# Patient Record
Sex: Male | Born: 1935 | Race: Black or African American | Hispanic: No | Marital: Married | Smoking: Former smoker
Health system: Southern US, Community
[De-identification: ages and names within clinical notes are randomized; demographics above are authoritative.]

## PROBLEM LIST (undated history)

## (undated) DIAGNOSIS — J449 Chronic obstructive pulmonary disease, unspecified: Secondary | ICD-10-CM

## (undated) DIAGNOSIS — C801 Malignant (primary) neoplasm, unspecified: Secondary | ICD-10-CM

## (undated) DIAGNOSIS — Z9861 Coronary angioplasty status: Secondary | ICD-10-CM

## (undated) DIAGNOSIS — Z955 Presence of coronary angioplasty implant and graft: Secondary | ICD-10-CM

## (undated) DIAGNOSIS — I509 Heart failure, unspecified: Secondary | ICD-10-CM

## (undated) DIAGNOSIS — E785 Hyperlipidemia, unspecified: Secondary | ICD-10-CM

## (undated) DIAGNOSIS — I10 Essential (primary) hypertension: Secondary | ICD-10-CM

## (undated) HISTORY — DX: Essential (primary) hypertension: I10

## (undated) HISTORY — DX: Hyperlipidemia, unspecified: E78.5

## (undated) HISTORY — PX: RADIOACTIVE SEED IMPLANT: SHX5150

## (undated) HISTORY — PX: COLON RESECTION: SHX5231

---

## 2004-01-12 ENCOUNTER — Ambulatory Visit: Admission: RE | Admit: 2004-01-12 | Discharge: 2004-03-14 | Payer: Self-pay | Admitting: Radiation Oncology

## 2004-03-21 ENCOUNTER — Ambulatory Visit: Admission: RE | Admit: 2004-03-21 | Discharge: 2004-06-08 | Payer: Self-pay | Admitting: Radiation Oncology

## 2004-04-28 ENCOUNTER — Ambulatory Visit (HOSPITAL_BASED_OUTPATIENT_CLINIC_OR_DEPARTMENT_OTHER): Admission: RE | Admit: 2004-04-28 | Discharge: 2004-04-28 | Payer: Self-pay | Admitting: Urology

## 2004-04-28 ENCOUNTER — Ambulatory Visit (HOSPITAL_COMMUNITY): Admission: RE | Admit: 2004-04-28 | Discharge: 2004-04-28 | Payer: Self-pay | Admitting: Urology

## 2008-05-08 ENCOUNTER — Ambulatory Visit (HOSPITAL_COMMUNITY): Admission: RE | Admit: 2008-05-08 | Discharge: 2008-05-08 | Payer: Self-pay | Admitting: Urology

## 2009-01-19 ENCOUNTER — Encounter: Admission: RE | Admit: 2009-01-19 | Discharge: 2009-01-19 | Payer: Self-pay | Admitting: Family Medicine

## 2011-02-24 NOTE — Op Note (Signed)
NAMEJAI, STEIL                           ACCOUNT NO.:  [id number]   MEDICAL RECORD NO.:  [id number]                   PATIENT TYPE:  AMB   LOCATION:  [facility]                                 FACILITY:  [facility]   PHYSICIAN:  Lindaann Slough, M.D.               DATE OF BIRTH:  [birth year]   DATE OF PROCEDURE:  04/28/2004  DATE OF DISCHARGE:                                 OPERATIVE REPORT   PREOPERATIVE DIAGNOSES:  Adenocarcinoma of prostate.   POSTOPERATIVE DIAGNOSES:  Adenocarcinoma of prostate.   PROCEDURE:  Seed implantation.   SURGEON:  Lindaann Slough, M.D. and Maryln Gottron, M.D.   ANESTHESIA:  General.   INDICATIONS FOR PROCEDURE:  The patient is a 77 year old male who had  positive prostate biopsy.  His PSA is 6.7 and Gleason score is 6.  Treatment  options were discussed with the patient and he chose to have seed  implantation. He is scheduled today for the procedure.   Under general anesthesia, the patient was prepped and draped and placed in  the dorsal lithotomy position.  A transrectal ultrasound was done and when  the images were identical to the preprocedure ultrasound, the transducer was  fixed and grid was attached to the transducer.  Then on the ultrasound and  fluoroscopic guidance, the seeds were implanted in the prostate. A total of  66 seeds were implanted.  Then the Foley catheter was removed.  A flexible  cystoscope was passed in the bladder. A strand of seed was protruding in the  bladder through the anterior lobe of the prostate on the right side.  The  strand was grasped with grasping forceps and removed. Two seeds were  attached to that strand.  The patient had a total of 64 seeds and Dr. Dayton Scrape  felt that it was not necessary to reimplant those seeds because of good  coverage. There is no stone or tumor in the bladder. The ureteral orifices  are in normal  position and shape with clear efflux.  The cystoscope was then removed. A  #16 Foley catheter  was then inserted in the bladder.   The patient tolerated the procedure well and left the OR in satisfactory  condition to post anesthesia care unit.                                               Lindaann Slough, M.D.    MN/MEDQ  D:  04/28/2004  T:  04/28/2004  Job:  [id number]   cc:   Maryln Gottron, M.D.  [address] [facility]  [place]  [place] [address]  Fax: [phone]   Renaye Rakers, M.D.  [phone] [address], [address]  [place]  [place] [address]  Fax: [phone]

## 2012-04-22 ENCOUNTER — Ambulatory Visit (INDEPENDENT_AMBULATORY_CARE_PROVIDER_SITE_OTHER): Payer: Medicare Other | Admitting: Family Medicine

## 2012-04-22 VITALS — BP 122/67 | HR 95 | Temp 98.3°F | Resp 16 | Ht 70.5 in | Wt 165.0 lb

## 2012-04-22 DIAGNOSIS — N289 Disorder of kidney and ureter, unspecified: Secondary | ICD-10-CM | POA: Insufficient documentation

## 2012-04-22 DIAGNOSIS — E785 Hyperlipidemia, unspecified: Secondary | ICD-10-CM | POA: Insufficient documentation

## 2012-04-22 DIAGNOSIS — R7309 Other abnormal glucose: Secondary | ICD-10-CM

## 2012-04-22 DIAGNOSIS — C61 Malignant neoplasm of prostate: Secondary | ICD-10-CM | POA: Insufficient documentation

## 2012-04-22 DIAGNOSIS — R739 Hyperglycemia, unspecified: Secondary | ICD-10-CM

## 2012-04-22 DIAGNOSIS — I10 Essential (primary) hypertension: Secondary | ICD-10-CM | POA: Insufficient documentation

## 2012-04-22 LAB — LIPID PANEL
HDL: 52 mg/dL (ref >39)
Triglycerides: 94 mg/dL (ref <150)
VLDL: 19 mg/dL (ref 0–40)

## 2012-04-22 LAB — COMPREHENSIVE METABOLIC PANEL
BUN: 27 mg/dL — ABNORMAL HIGH (ref 6–23)
Calcium: 10.5 mg/dL (ref 8.4–10.5)
Chloride: 104 mEq/L (ref 96–112)
Sodium: 140 mEq/L (ref 135–145)
Total Bilirubin: 0.4 mg/dL (ref 0.3–1.2)

## 2012-04-22 LAB — POCT CBC
Hemoglobin: 12.8 g/dL — AB (ref 14.1–18.1)
Lymph, poc: 1.8 (ref 0.6–3.4)
MCH, POC: 26.6 pg — AB (ref 27–31.2)
MCHC: 30.6 g/dL — AB (ref 31.8–35.4)
MID (cbc): 0.6 (ref 0–0.9)
POC LYMPH PERCENT: 28.5 %L (ref 10–50)
RBC: 4.81 M/uL (ref 4.69–6.13)
RDW, POC: 16.6 %
WBC: 6.4 10*3/uL (ref 4.6–10.2)
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
## 2012-04-22 NOTE — Patient Instructions (Addendum)
Return in one year or as needed  Avoid excessive sweets and sugars in your diet

## 2012-04-24 ENCOUNTER — Encounter: Payer: Self-pay | Admitting: Family Medicine

## 2012-12-04 ENCOUNTER — Emergency Department (HOSPITAL_COMMUNITY): Payer: Medicare Other

## 2012-12-04 ENCOUNTER — Emergency Department (HOSPITAL_COMMUNITY)
Admission: EM | Admit: 2012-12-04 | Discharge: 2012-12-04 | Disposition: A | Discharge status: Home / Self Care | Payer: Medicare Other | Attending: Emergency Medicine | Admitting: Emergency Medicine

## 2012-12-04 ENCOUNTER — Encounter (HOSPITAL_COMMUNITY): Payer: Self-pay

## 2012-12-04 DIAGNOSIS — Y9289 Other specified places as the place of occurrence of the external cause: Secondary | ICD-10-CM | POA: Insufficient documentation

## 2012-12-04 DIAGNOSIS — R55 Syncope and collapse: Secondary | ICD-10-CM | POA: Insufficient documentation

## 2012-12-04 DIAGNOSIS — N289 Disorder of kidney and ureter, unspecified: Secondary | ICD-10-CM | POA: Insufficient documentation

## 2012-12-04 DIAGNOSIS — Y9389 Activity, other specified: Secondary | ICD-10-CM | POA: Insufficient documentation

## 2012-12-04 DIAGNOSIS — Z7982 Long term (current) use of aspirin: Secondary | ICD-10-CM | POA: Insufficient documentation

## 2012-12-04 DIAGNOSIS — Z79899 Other long term (current) drug therapy: Secondary | ICD-10-CM | POA: Insufficient documentation

## 2012-12-04 DIAGNOSIS — Z87891 Personal history of nicotine dependence: Secondary | ICD-10-CM | POA: Insufficient documentation

## 2012-12-04 DIAGNOSIS — R296 Repeated falls: Secondary | ICD-10-CM | POA: Insufficient documentation

## 2012-12-04 LAB — CBC WITH DIFFERENTIAL/PLATELET
Basophils Absolute: 0 10*3/uL (ref 0.0–0.1)
Basophils Relative: 0 % (ref 0–1)
Eosinophils Absolute: 0.5 10*3/uL (ref 0.0–0.7)
Eosinophils Relative: 6 % — ABNORMAL HIGH (ref 0–5)
HCT: 33 % — ABNORMAL LOW (ref 39.0–52.0)
Lymphs Abs: 1.7 10*3/uL (ref 0.7–4.0)
MCH: 27.8 pg (ref 26.0–34.0)
MCV: 80.5 fL (ref 78.0–100.0)
Monocytes Relative: 7 % (ref 3–12)
RDW: 15 % (ref 11.5–15.5)
WBC: 7.2 10*3/uL (ref 4.0–10.5)

## 2012-12-04 LAB — BASIC METABOLIC PANEL
CO2: 25 mEq/L (ref 19–32)
Calcium: 10 mg/dL (ref 8.4–10.5)
Chloride: 105 mEq/L (ref 96–112)
Glucose, Bld: 86 mg/dL (ref 70–99)

## 2012-12-04 MED ORDER — SODIUM CHLORIDE 0.9 % IV BOLUS (SEPSIS)
1000.0000 mL | Freq: Once | INTRAVENOUS | Status: AC
  Administered 2012-12-04: 1000 mL via INTRAVENOUS

## 2012-12-04 NOTE — ED Notes (Signed)
Pt. oob to the bathroom, gait steady. Pt. Denies any dizziness

## 2012-12-04 NOTE — ED Provider Notes (Signed)
History     CSN: [id number]  Arrival date & time 12/04/12  1108   First MD Initiated Contact with Patient 12/04/12 1124      Chief Complaint  Patient presents with  . Fall    (Consider location/radiation/quality/duration/timing/severity/associated sxs/prior treatment) Patient is a 79 y.o. male presenting with fall. The history is provided by the patient.  Fall Pertinent negatives include no numbness, no abdominal pain, no nausea, no vomiting and no headaches.   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 States she just began to feel lightheaded. No chest pain or palpitations. He states he felt a little lightheaded and then got better. No nausea vomiting. He has not had episodes like this before. He states he is feeling better now. No diarrhea. Patient has a doctor that he does see. No known cardiac disease.  History reviewed. No pertinent past medical history.  History reviewed. No pertinent past surgical history.  No family history on file.  History  Substance Use Topics  . Smoking status: Former Smoker    Quit date: 04/23/2007  . Smokeless tobacco: Not on file  . Alcohol Use: No      Review of Systems  Constitutional: Negative for activity change and appetite change.  HENT: Negative for neck stiffness.   Eyes: Negative for pain.  Respiratory: Negative for chest tightness and shortness of breath.   Cardiovascular: Negative for chest pain and leg swelling.  Gastrointestinal: Negative for nausea, vomiting, abdominal pain and diarrhea.  Genitourinary: Negative for flank pain.  Musculoskeletal: Negative for back pain.  Skin: Negative for rash.  Neurological: Positive for light-headedness. Negative for weakness, numbness and headaches.  Psychiatric/Behavioral: Negative for behavioral problems.    Allergies  Review of patient's allergies indicates no known allergies.  Home Medications   Current Outpatient Rx  Name  Route  Sig   Dispense  Refill  . aspirin 325 MG EC tablet   Oral   Take 325 mg by mouth daily.         [name] atorvastatin (LIPITOR) 40 MG tablet   Oral   Take 20 mg by mouth daily.         [name] diltiazem (TIAZAC) 360 MG 24 hr capsule   Oral   Take 360 mg by mouth daily.         . fosinopril (MONOPRIL) 40 MG tablet   Oral   Take 40 mg by mouth 2 (two) times daily.         . hydrochlorothiazide (HYDRODIURIL) 25 MG tablet   Oral   Take 25 mg by mouth daily.         [name] terazosin (HYTRIN) 5 MG capsule   Oral   Take 5 mg by mouth at bedtime.           BP 148/66  Pulse 61  Temp(Src) 98 F (36.7 C) (Oral)  Resp 17  SpO2 99%  Physical Exam  Nursing note and vitals reviewed. Constitutional: He is oriented to person, place, and time. He appears well-developed and well-nourished.  HENT:  Head: Normocephalic and atraumatic.  Eyes: EOM are normal. Pupils are equal, round, and reactive to light.  Neck: Normal range of motion. Neck supple.  Cardiovascular: Normal rate, regular rhythm and normal heart sounds.   No murmur heard. Pulmonary/Chest: Effort normal and breath sounds normal.  Abdominal: Soft. Bowel sounds are normal. He exhibits no distension and no mass. There is no tenderness. There is no rebound and no  guarding.  Musculoskeletal: Normal range of motion. He exhibits no edema.  Neurological: He is alert and oriented to person, place, and time. No cranial nerve deficit.  Skin: Skin is warm and dry.  Psychiatric: He has a normal mood and affect.    ED Course  Procedures (including critical care time)  Labs Reviewed  CBC WITH DIFFERENTIAL - Abnormal; Notable for the following:    RBC 4.10 (*)    Hemoglobin 11.4 (*)    HCT 33.0 (*)    Eosinophils Relative 6 (*)    All other components within normal limits  BASIC METABOLIC PANEL - Abnormal; Notable for the following:    BUN 35 (*)    Creatinine, Ser 1.70 (*)    GFR calc non Af Amer 35 (*)    GFR calc Af Amer 41 (*)    All  other components within normal limits  TROPONIN I  GLUCOSE, CAPILLARY  POCT I-STAT TROPONIN I   Dg Chest 2 View  12/04/2012  *RADIOLOGY REPORT*  Clinical Data: Dizziness.  CHEST - 2 VIEW  Comparison: 01/19/2009  Findings: Chronic changes throughout the lungs with interstitial prominence, particularly in the lung bases, likely fibrosis.  Mild hyperinflation.  Heart is normal size.  No effusions.  No acute bony abnormality.  IMPRESSION: Hyperinflation/COPD.  Probable chronic interstitial lung disease and fibrosis.  No definite acute process.   Original Report Authenticated By: Charlett Nose, M.D.      1. Near syncope   2. Renal insufficiency      Date: 12/04/2012  Rate: 65  Rhythm: normal sinus rhythm  QRS Axis: normal  Intervals: normal  ST/T Wave abnormalities: normal  Conduction Disutrbances:left bundle branch block  Narrative Interpretation: LBBB is new since 7/13  Old EKG Reviewed: changes noted    MDM  Patient with a near syncopal episode. BUN creatinine are mildly elevated. Patient feels normal now. He was initially orthostatic but now ambulates.difficulty. EKG shows left bundle branch block. [name] not having chest pain. [name] had no arrhythmias on the monitor. He'll be discharged home to followup with his primary care Dr. His been given 1 L of normal saline.        Juliet Rude. Rubin Payor, MD 12/04/12 [phone]

## 2012-12-04 NOTE — ED Notes (Addendum)
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  Witnessed by bystanders. No LOC.  Pt. Remembers the event.  Pt. Denies any chest pain or sob, or dizziness at present time.  No neuro deficits noted. Pt. Is alert and oriented X3.  Pt. Denies any back or neck pain

## 2012-12-09 ENCOUNTER — Ambulatory Visit (INDEPENDENT_AMBULATORY_CARE_PROVIDER_SITE_OTHER): Payer: Medicare Other | Admitting: Family Medicine

## 2012-12-09 ENCOUNTER — Encounter: Payer: Self-pay | Admitting: Family Medicine

## 2012-12-09 VITALS — BP 137/87 | HR 87 | Temp 97.7°F | Resp 18 | Ht 70.5 in | Wt 164.0 lb

## 2012-12-09 DIAGNOSIS — I10 Essential (primary) hypertension: Secondary | ICD-10-CM

## 2012-12-09 DIAGNOSIS — R55 Syncope and collapse: Secondary | ICD-10-CM

## 2012-12-09 DIAGNOSIS — N289 Disorder of kidney and ureter, unspecified: Secondary | ICD-10-CM

## 2012-12-09 DIAGNOSIS — D649 Anemia, unspecified: Secondary | ICD-10-CM

## 2012-12-09 LAB — POCT CBC
HCT, POC: 37.7 % — AB (ref 43.5–53.7)
Hemoglobin: 11.9 g/dL — AB (ref 14.1–18.1)
MCH, POC: 27.1 pg (ref 27–31.2)
POC Granulocyte: 4.6 (ref 2–6.9)
POC MID %: 8.9 %M (ref 0–12)
RDW, POC: 15.7 %
WBC: 7 10*3/uL (ref 4.6–10.2)

## 2012-12-09 LAB — COMPREHENSIVE METABOLIC PANEL
ALT: <8 U/L (ref 0–53)
AST: 11 U/L (ref 0–37)
Alkaline Phosphatase: 96 U/L (ref 39–117)
CO2: 26 mEq/L (ref 19–32)
Calcium: 10.3 mg/dL (ref 8.4–10.5)
Chloride: 105 mEq/L (ref 96–112)
Potassium: 4.5 mEq/L (ref 3.5–5.3)
Total Bilirubin: 0.4 mg/dL (ref 0.3–1.2)
Total Protein: 7.2 g/dL (ref 6.0–8.3)

## 2012-12-09 LAB — FERRITIN: Ferritin: 23 ng/mL (ref 22–322)

## 2012-12-09 NOTE — Patient Instructions (Signed)
Continue current medications, not taking the extra pill that was increased recently.   Return late August or sooner if needed.

## 2012-12-09 NOTE — Progress Notes (Signed)
Subjective: Patient saw a new doctor at the [facility] recently, and they may change his medication. Believe they increased his hydrochlorothiazide. 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 But was not unconscious. He went to the emergency room and was evaluated. They cut back on his hydrochlorothiazide and gave him fluids. He has not filed for the last week. He did have a little renal insufficiency when he was there. HEENT is no complaints. He has no dizziness or other neurologic symptoms. No chest pains or palpitations.  Objective: Pleasant alert gentleman in no major distress. He is quite active for medicine. His throat is clear. Neck supple without nodes thyromegaly. No carotid bruits. Chest is clear to auscultation. Heart regular without murmurs gallops or arrhythmias. Abdomen soft without masses or tenderness. Extremities normal. Gait normal. Results for orders placed in visit on 12/09/12  POCT CBC      Result Value Range   WBC 7.0  4.6 - 10.2 K/uL   Lymph, poc 1.8  0.6 - 3.4   POC LYMPH PERCENT 25.9  10 - 50 %L   MID (cbc) 0.6  0 - 0.9   POC MID % 8.9  0 - 12 %M   POC Granulocyte 4.6  2 - 6.9   Granulocyte percent 65.2  37 - 80 %G   RBC 4.39 (*) 4.69 - 6.13 M/uL   Hemoglobin 11.9 (*) 14.1 - 18.1 g/dL   HCT, POC 29.5 (*) 62.1 - 53.7 %   MCV 85.9  80 - 97 fL   MCH, POC 27.1  27 - 31.2 pg   MCHC 31.6 (*) 31.8 - 35.4 g/dL   RDW, POC 30.8     Platelet Count, POC 340  142 - 424 K/uL   MPV 9.6  0 - 99.8 fL   Assessment: Recent near-syncope Anemia, chronic. This is probably an anemia of chronic disease as he has had a normal ferritin in the past and has no history of blood loss Hypertension, controlled Renal insufficiency  Plan: Recheck labs No new treatments given him today. Hemoglobin is better than it was when he was in the emergency room.

## 2013-04-28 ENCOUNTER — Encounter: Payer: Self-pay | Admitting: Family Medicine

## 2013-04-28 ENCOUNTER — Ambulatory Visit (INDEPENDENT_AMBULATORY_CARE_PROVIDER_SITE_OTHER): Payer: Medicare Other | Admitting: Family Medicine

## 2013-04-28 ENCOUNTER — Other Ambulatory Visit: Payer: Self-pay | Admitting: Family Medicine

## 2013-04-28 VITALS — BP 120/68 | HR 69 | Temp 97.9°F | Resp 18 | Ht 69.75 in | Wt 164.8 lb

## 2013-04-28 DIAGNOSIS — I70209 Unspecified atherosclerosis of native arteries of extremities, unspecified extremity: Secondary | ICD-10-CM

## 2013-04-28 DIAGNOSIS — I7090 Unspecified atherosclerosis: Secondary | ICD-10-CM

## 2013-04-28 DIAGNOSIS — Z8546 Personal history of malignant neoplasm of prostate: Secondary | ICD-10-CM

## 2013-04-28 DIAGNOSIS — Z0000 Encounter for general adult medical examination without abnormal findings: Secondary | ICD-10-CM

## 2013-04-28 DIAGNOSIS — E785 Hyperlipidemia, unspecified: Secondary | ICD-10-CM

## 2013-04-28 DIAGNOSIS — I10 Essential (primary) hypertension: Secondary | ICD-10-CM

## 2013-04-28 LAB — LIPID PANEL
LDL Cholesterol: 175 mg/dL — ABNORMAL HIGH (ref 0–99)
Total CHOL/HDL Ratio: 4.2 Ratio

## 2013-04-28 LAB — COMPREHENSIVE METABOLIC PANEL
Albumin: 3.9 g/dL (ref 3.5–5.2)
BUN: 28 mg/dL — ABNORMAL HIGH (ref 6–23)
Calcium: 10.3 mg/dL (ref 8.4–10.5)
Glucose, Bld: 62 mg/dL — ABNORMAL LOW (ref 70–99)
Total Bilirubin: 0.4 mg/dL (ref 0.3–1.2)

## 2013-04-28 LAB — HEMOGLOBIN A1C: Hgb A1c MFr Bld: 6 % — ABNORMAL HIGH (ref <5.7)

## 2013-04-28 NOTE — Progress Notes (Signed)
Complete physical exam Patient is here for his annual physical examination. We had computer dysfunction so portions of the record may not be totally completed. He has no complaints. He had some constipation problems but stool softeners and is doing okay. He gets his medicines from the [place] and does not need any refills. He sees them every 4 months or so. He wanted a physical exam today.  Past family social history unremarkable. He still works at the [employer] at least one and weak. He gets a lot of exercise there.  Review of systems: Constitutional unremarkable HEENT unremarkable Cardiovascular unremarkable Respiratory: Unremarkable GI: Unremarkable GU: Unremarkable Genitourinary: Unremarkable. Not sexually functional in the lumbar Muscular skeletal some left hip pains Dermatologic: Unremarkable Neurologic: Unremarkable Psychiatric: Unremarkable Endocrinologic: Unremarkable   Physical examination: Well-developed man in no major distress with normal weight. HEENT normal. He has a high exam yearly. Ears normal except for some cerumen on the left. Throat clear. Neck supple without nodes thyromegaly. No carotid bruits. Chest clear process. Heart regular without murmurs gallops or arrhythmias. And soft the mass or tenderness. Digital exam was not done since he gets that at the [place]. Normal male external genitalia testes descended and no hernias. Nodes were noted. Skin unremarkable. He has a old cyst or S4 head which is unchanged. Musculoskeletal shows fair range of motion. Back is unremarkable marked will. Neuro he is alert and oriented motor and sensory are normal. Decreased pulses in both feet. He quit smoking some years ago.  Assessment: Complete physical examination Osteoarthritis of hip History of prostate cancer Hypertension Hyperlipidemia Probable peripheral vascular disease.  Same treatments. Return when necessary or in one year

## 2013-04-28 NOTE — Patient Instructions (Addendum)
Computer was down

## 2013-05-06 ENCOUNTER — Telehealth: Payer: Self-pay | Admitting: Family Medicine

## 2013-05-06 DIAGNOSIS — E785 Hyperlipidemia, unspecified: Secondary | ICD-10-CM

## 2013-05-06 MED ORDER — ROSUVASTATIN CALCIUM 40 MG PO TABS: 40.0000 mg | ORAL_TABLET | Freq: Every day | ORAL | Status: DC

## 2013-05-06 NOTE — Telephone Encounter (Signed)
Patient came in office this morning to discuss lab results and medications. Reviewed labs and clarified med list. Needs to change Lipitor to Crestor 40 mg 1 poqd sent to [facility].

## 2013-12-06 ENCOUNTER — Ambulatory Visit (INDEPENDENT_AMBULATORY_CARE_PROVIDER_SITE_OTHER): Payer: Medicare Other | Admitting: Family Medicine

## 2013-12-06 ENCOUNTER — Ambulatory Visit: Payer: Medicare Other

## 2013-12-06 VITALS — BP 140/70 | HR 77 | Temp 97.5°F | Resp 16 | Ht 70.5 in | Wt 161.0 lb

## 2013-12-06 DIAGNOSIS — N289 Disorder of kidney and ureter, unspecified: Secondary | ICD-10-CM

## 2013-12-06 DIAGNOSIS — J810 Acute pulmonary edema: Secondary | ICD-10-CM

## 2013-12-06 DIAGNOSIS — R0989 Other specified symptoms and signs involving the circulatory and respiratory systems: Secondary | ICD-10-CM

## 2013-12-06 DIAGNOSIS — R0609 Other forms of dyspnea: Secondary | ICD-10-CM

## 2013-12-06 DIAGNOSIS — C61 Malignant neoplasm of prostate: Secondary | ICD-10-CM

## 2013-12-06 DIAGNOSIS — E785 Hyperlipidemia, unspecified: Secondary | ICD-10-CM

## 2013-12-06 DIAGNOSIS — I10 Essential (primary) hypertension: Secondary | ICD-10-CM

## 2013-12-06 DIAGNOSIS — R0600 Dyspnea, unspecified: Secondary | ICD-10-CM

## 2013-12-06 LAB — COMPLETE METABOLIC PANEL WITH GFR
ALBUMIN: 3.4 g/dL — AB (ref 3.5–5.2)
ALT: <8 U/L (ref 0–53)
AST: 13 U/L (ref 0–37)
Alkaline Phosphatase: 88 U/L (ref 39–117)
BUN: 16 mg/dL (ref 6–23)
CO2: 25 mEq/L (ref 19–32)
Calcium: 9.6 mg/dL (ref 8.4–10.5)
Chloride: 108 mEq/L (ref 96–112)
Creat: 1.2 mg/dL (ref 0.50–1.35)
GFR, Est African American: 63 mL/min
GFR, Est Non African American: 55 mL/min — ABNORMAL LOW
Glucose, Bld: 107 mg/dL — ABNORMAL HIGH (ref 70–99)
POTASSIUM: 3.9 meq/L (ref 3.5–5.3)
Sodium: 142 mEq/L (ref 135–145)
Total Bilirubin: 0.5 mg/dL (ref 0.2–1.2)
Total Protein: 7.1 g/dL (ref 6.0–8.3)

## 2013-12-06 LAB — POCT CBC
GRANULOCYTE PERCENT: 67.1 % (ref 37–80)
HCT, POC: 41 % — AB (ref 43.5–53.7)
Hemoglobin: 12.9 g/dL — AB (ref 14.1–18.1)
Lymph, poc: 1.6 (ref 0.6–3.4)
MCH: 27.6 pg (ref 27–31.2)
MCHC: 31.5 g/dL — AB (ref 31.8–35.4)
MCV: 87.5 fL (ref 80–97)
MID (CBC): 0.5 (ref 0–0.9)
MPV: 9 fL (ref 0–99.8)
POC Granulocyte: 4.4 (ref 2–6.9)
POC LYMPH PERCENT: 25 %L (ref 10–50)
POC MID %: 7.9 % (ref 0–12)
Platelet Count, POC: 299 10*3/uL (ref 142–424)
RBC: 4.68 M/uL — AB (ref 4.69–6.13)
RDW, POC: 15.1 %
WBC: 6.6 10*3/uL (ref 4.6–10.2)

## 2013-12-06 LAB — LIPID PANEL
Cholesterol: 292 mg/dL — ABNORMAL HIGH (ref 0–200)
HDL: 53 mg/dL (ref >39)
LDL Cholesterol: 218 mg/dL — ABNORMAL HIGH (ref 0–99)
TRIGLYCERIDES: 103 mg/dL (ref <150)
Total CHOL/HDL Ratio: 5.5 Ratio
VLDL: 21 mg/dL (ref 0–40)

## 2013-12-06 LAB — IFOBT (OCCULT BLOOD): IFOBT: NEGATIVE

## 2013-12-06 NOTE — Progress Notes (Signed)
Annual exam  History: Patient is here for a regular check. He goes to the [place] a couple of times a year, and they manage many of his things. He has been doing fairly well. [facility] He needs to keep in contact with a Dr. hearing Lady Gary however in case he has acute problems here.  Past medical history: Surgeries: He has had a operation on his bowels where a small piece of intestine was then removed many years ago. No known pathology though he wonders whether there was a little cancer and they are not. No further problems. He did have a prostate cancer and radiation seed removal of the prostate gland. He has not had any further problems with that. Medical illnesses: History of prior pneumonia and COPD History hypertension History of hyperlipidemia History renal insufficiency Medication allergies: None Regular medications: See list new  Social history: Married, remains active and independent. His main limitations are his shortness of breath on exertion. He is a retired Geophysicist/field seismologist.  Review of systems: Constitutional: Dyspnea on exertion HEENT: Unremarkable. He sees Dr. about once a year Respiratory: Shortness of breath as noted above Cardiovascular: Unremarkable Gastrointestinal: Unremarkable Genitourinary: Nocturia one or more times pineal but he has had a trach musculo skeletal: No major arthritic complaints Neurologic: Unremarkable  Physical examination: Well-developed well-nourished man, lean, no major acute distress. Wears glasses. TMs normal. A little a little bit of wax in both ears. Throat was clear. Teeth fair considering his age. Eyes PERRLA. Neck supple without nodes or thyromegaly. No carotid bruits. Chest clear to auscultation. Heart regular without murmurs gallops, but occasional ectopy. Abdomen was soft without masses tenderness. Normal male external genitalia has a right hydrocele, small tomedium size. Digital exam reveals essentially no  prostate tissue. Stools  Negative. Extremities unremarkable. Skin normal. Has 1-2+ pitting edema.  Assessment: Dyspnea on exertion Hypertension Hyperlipidemia COPD Right hydrocele       Results for orders placed in visit on 12/06/13  POCT CBC      Result Value Ref Range   WBC 6.6  4.6 - 10.2 K/uL   Lymph, poc 1.6  0.6 - 3.4   POC LYMPH PERCENT 25.0  10 - 50 %L   MID (cbc) 0.5  0 - 0.9   POC MID % 7.9  0 - 12 %M   POC Granulocyte 4.4  2 - 6.9   Granulocyte percent 67.1  37 - 80 %G   RBC 4.68 (*) 4.69 - 6.13 M/uL   Hemoglobin 12.9 (*) 14.1 - 18.1 g/dL   HCT, POC 41.0 (*) 43.5 - 53.7 %   MCV 87.5  80 - 97 fL   MCH, POC 27.6  27 - 31.2 pg   MCHC 31.5 (*) 31.8 - 35.4 g/dL   RDW, POC 15.1     Platelet Count, POC 299  142 - 424 K/uL   MPV 9.0  0 - 99.8 fL  IFOBT (OCCULT BLOOD)      Result Value Ref Range   IFOBT Negative     Plan: EKG was no acute changes. Has had a left bundle branch block and occasional PVCs. [facility] reading (PRIMARY) by  Dr. Linna Darner Chest x-ray reveals a lot of scarring on the right side. No acute changes. Looks similar to the past.  Continue same medications. Take a copy of his reports to the [place].  If increased dyspnea on exertion would need an echocardiogram

## 2013-12-06 NOTE — Patient Instructions (Signed)
Continue same medications  If you develop more shortness of breath or more swelling should come in promptly for recheck.

## 2013-12-09 ENCOUNTER — Encounter: Payer: Self-pay | Admitting: Family Medicine

## 2013-12-22 ENCOUNTER — Telehealth: Payer: Self-pay

## 2013-12-22 NOTE — Telephone Encounter (Signed)
Dr. Hopper, please advise. 

## 2013-12-22 NOTE — Telephone Encounter (Signed)
Pt came in to walk in clinic and stated he saw Dr Linna Darner recently and then went to the [facility] at his request. He states that they agreed if the medication/treatment for his shortness of breath did not work that he should RTC to tell Dr Linna Darner he needs the rx they talked about. I clarified if he was supposed to see Dr Linna Darner in an office visit or just to leave a message that he needs the rx. He states he was just supposed to leave word that he needs the rx.   [employer] [employer]  bf

## 2013-12-26 NOTE — Telephone Encounter (Signed)
Cannot tell from my nodes what medications he might need regarding his breathing. I thought the [facility] would take care of it since they manage these things. I think you will have to come back in the check and talk further unless he knows exactly a certain medicine that I would have to her just refill.

## 2013-12-27 NOTE — Telephone Encounter (Signed)
Pt will be in Monday 8-2 to discuss this issue. He mentioned an inhaler that he needed. He would like to see you and go over this. Gave him your schedule for Monday.

## 2013-12-28 ENCOUNTER — Ambulatory Visit (INDEPENDENT_AMBULATORY_CARE_PROVIDER_SITE_OTHER): Payer: Medicare Other | Admitting: Family Medicine

## 2013-12-28 VITALS — BP 100/63 | HR 76 | Temp 97.7°F | Resp 18 | Wt 154.0 lb

## 2013-12-28 DIAGNOSIS — J4489 Other specified chronic obstructive pulmonary disease: Secondary | ICD-10-CM

## 2013-12-28 DIAGNOSIS — Z8546 Personal history of malignant neoplasm of prostate: Secondary | ICD-10-CM

## 2013-12-28 DIAGNOSIS — R634 Abnormal weight loss: Secondary | ICD-10-CM

## 2013-12-28 DIAGNOSIS — E785 Hyperlipidemia, unspecified: Secondary | ICD-10-CM

## 2013-12-28 DIAGNOSIS — J449 Chronic obstructive pulmonary disease, unspecified: Secondary | ICD-10-CM | POA: Insufficient documentation

## 2013-12-28 LAB — COMPREHENSIVE METABOLIC PANEL
ALT: 8 U/L (ref 0–53)
AST: 13 U/L (ref 0–37)
Albumin: 3.4 g/dL — ABNORMAL LOW (ref 3.5–5.2)
Alkaline Phosphatase: 96 U/L (ref 39–117)
BUN: 15 mg/dL (ref 6–23)
CO2: 27 mEq/L (ref 19–32)
Calcium: 10.2 mg/dL (ref 8.4–10.5)
Chloride: 102 mEq/L (ref 96–112)
Creat: 1.29 mg/dL (ref 0.50–1.35)
Glucose, Bld: 126 mg/dL — ABNORMAL HIGH (ref 70–99)
Potassium: 4 mEq/L (ref 3.5–5.3)
Sodium: 139 mEq/L (ref 135–145)
Total Bilirubin: 0.6 mg/dL (ref 0.2–1.2)
Total Protein: 7.3 g/dL (ref 6.0–8.3)

## 2013-12-28 LAB — POCT CBC
Granulocyte percent: 68.7 %G (ref 37–80)
HCT, POC: 42.8 % — AB (ref 43.5–53.7)
Hemoglobin: 13.6 g/dL — AB (ref 14.1–18.1)
Lymph, poc: 1.2 (ref 0.6–3.4)
MCH, POC: 27.1 pg (ref 27–31.2)
MCHC: 31.8 g/dL (ref 31.8–35.4)
MCV: 85.3 fL (ref 80–97)
MID (cbc): 0.4 (ref 0–0.9)
MPV: 8.8 fL (ref 0–99.8)
POC Granulocyte: 3.6 (ref 2–6.9)
POC LYMPH PERCENT: 23.1 %L (ref 10–50)
POC MID %: 8.2 %M (ref 0–12)
Platelet Count, POC: 367 10*3/uL (ref 142–424)
RBC: 5.02 M/uL (ref 4.69–6.13)
RDW, POC: 14.5 %
WBC: 5.3 10*3/uL (ref 4.6–10.2)

## 2013-12-28 LAB — LIPID PANEL
Cholesterol: 290 mg/dL — ABNORMAL HIGH (ref 0–200)
HDL: 56 mg/dL (ref >39)
LDL Cholesterol: 210 mg/dL — ABNORMAL HIGH (ref 0–99)
Total CHOL/HDL Ratio: 5.2 Ratio
Triglycerides: 120 mg/dL (ref <150)
VLDL: 24 mg/dL (ref 0–40)

## 2013-12-28 LAB — POCT SEDIMENTATION RATE: POCT SED RATE: 53 mm/hr — AB (ref 0–22)

## 2013-12-28 MED ORDER — METHYLPREDNISOLONE ACETATE 80 MG/ML IJ SUSP
80.0000 mg | Freq: Once | INTRAMUSCULAR | Status: AC
  Administered 2013-12-28: 80 mg via INTRAMUSCULAR

## 2013-12-28 NOTE — Progress Notes (Signed)
This chart was scribed for Edward Haber, MD by Roxan Diesel, ED scribe.  This patient was seen in [place] [facility] and the patient's care was started at 8:54 AM.  @[facility]OGO @  Patient ID: Edward Nunez MRN: [id number], DOB: 1926/10/09, 80 y.o. Date of Encounter: 12/28/2013, 8:54 AM  Primary Physician: [name], MD  Chief Complaint: Weight Loss  HPI: 80 y.o. year old male with history below presents with persistent, gradual weight loss and shortness of breath.  Pt reports he began losing weight some time ago.  Onset was initially too gradual for him to notice.  He has lost weight from 164 pounds at a visit here last year to 154 today.  He reports on some days he has a normal appetite but other days his appetite is decreased.  Pt also reports SOB.  He denies CP, abdominal pain, or acid reflux.  Pt is seen by the [place].  He has been seen there about his weight loss.  He had "tests" done there including x-ray but no recent colonoscopy.  Last colonoscopy within the past 5 years.  He is unsure whether they did any breathing tests.    Pt is a former smoker and quit 12 years ago.  He has h/o prostate seed implant.  Pt has h/o HTN and hyperlipidemia which are managed by the [place].  He takes either Crestor or Lipitor as instructed.  He denies recent missed doses.   No past medical history on file.   Home Meds: Prior to Admission medications   Medication Sig Start Date End Date Taking? Authorizing Provider  aspirin 325 MG EC tablet Take 325 mg by mouth daily.   Yes Historical Provider, MD  atorvastatin (LIPITOR) 40 MG tablet Take 20 mg by mouth daily.   Yes Historical Provider, MD  diltiazem (TIAZAC) 360 MG 24 hr capsule Take 360 mg by mouth daily.   Yes Historical Provider, MD  fosinopril (MONOPRIL) 40 MG tablet Take 40 mg by mouth 2 (two) times daily.   Yes Historical Provider, MD  hydrochlorothiazide (HYDRODIURIL) 25 MG tablet Take 25 mg by mouth daily.   Yes Historical Provider, MD   rosuvastatin (CRESTOR) 40 MG tablet Take 1 tablet (40 mg total) by mouth daily. 05/06/13  Yes Mancel Bale, PA-C  terazosin (HYTRIN) 5 MG capsule Take 5 mg by mouth at bedtime.   Yes Historical Provider, MD    No past surgical history on file.   Allergies: No Known Allergies  History   Social History   Marital Status: Married    Spouse Name: N/A    Number of Children: N/A   Years of Education: N/A   Occupational History   Not on file.   Social History Main Topics   Smoking status: Former Smoker    Quit date: 04/23/2007   Smokeless tobacco: Not on file   Alcohol Use: No   Drug Use: Not on file   Sexual Activity: Not on file   Other Topics Concern   Not on file   Social History Narrative   No narrative on file     Review of Systems: Constitutional: positive for weight changes.  negative for chills, fever, night sweats, or fatigue  HEENT: negative for vision changes, hearing loss, congestion, rhinorrhea, ST, epistaxis, or sinus pressure Cardiovascular: negative for chest pain or palpitations Respiratory: positive for shortness of breath.  negative for hemoptysis, wheezing, or cough Abdominal: negative for abdominal pain, nausea, vomiting, diarrhea, or constipation Dermatological: negative for rash Neurologic: negative for  headache, dizziness, or syncope All other systems reviewed and are otherwise negative with the exception to those above and in the HPI.   Physical Exam: Blood pressure 100/63, pulse 76, temperature 97.7 F (36.5 C), temperature source Oral, resp. rate 18, weight 154 lb (69.854 kg), SpO2 96.00%., Body mass index is 21.78 kg/(m^2). General: Well developed, well nourished, in no acute distress. Head: Normocephalic, atraumatic, eyes without discharge, sclera non-icteric, nares are without discharge. Bilateral auditory canals clear, TM's are without perforation, pearly grey and translucent with reflective cone of light bilaterally. Oral cavity  moist, posterior pharynx without exudate, erythema, peritonsillar abscess, or post nasal drip.  Neck: Supple. No thyromegaly. Full ROM. No lymphadenopathy. Lungs: Decreased breath sounds.  Clear bilaterally to auscultation without wheezes, rales, or rhonchi. Breathing is unlabored.   Heart: Decreased heart sounds.  RRR with S1 S2. No murmurs, rubs, or gallops appreciated. Abdomen: Soft, non-tender, non-distended with normoactive bowel sounds. No hepatosplenomegaly. No rebound/guarding. No obvious abdominal masses. Msk:  Strength and tone normal for age. Extremities/Skin: Warm and dry. No clubbing or cyanosis. No edema. No rashes or suspicious lesions. Neuro: Alert and oriented X 3. Moves all extremities spontaneously. Gait is normal. CNII-XII grossly in tact. Psych:  Responds to questions appropriately with a normal affect.   Labs:   ASSESSMENT AND PLAN:  80 y.o. year old male with   1. Weight loss   2. COPD (chronic obstructive pulmonary disease)   3. History of prostate cancer   4. Hyperlipemia    Weight loss - Plan: POCT CBC, Comprehensive metabolic panel, Lipid panel, methylPREDNISolone acetate (DEPO-MEDROL) injection 80 mg, Ambulatory referral to Pulmonology  COPD (chronic obstructive pulmonary disease) - Plan: POCT CBC, POCT SEDIMENTATION RATE, methylPREDNISolone acetate (DEPO-MEDROL) injection 80 mg, Ambulatory referral to Pulmonology  History of prostate cancer - Plan: POCT CBC, POCT SEDIMENTATION RATE, PSA  Hyperlipemia - Plan: Comprehensive metabolic panel, Lipid panel    Signed, Edward Haber, MD 12/28/2013 8:54 AM

## 2013-12-29 LAB — PSA: PSA: 0.22 ng/mL (ref <=4.00)

## 2014-01-21 ENCOUNTER — Ambulatory Visit (INDEPENDENT_AMBULATORY_CARE_PROVIDER_SITE_OTHER): Payer: Medicare Other | Admitting: Emergency Medicine

## 2014-01-21 VITALS — BP 104/68 | HR 62 | Temp 97.8°F | Resp 16 | Ht 70.5 in | Wt 144.4 lb

## 2014-01-21 DIAGNOSIS — I509 Heart failure, unspecified: Secondary | ICD-10-CM

## 2014-01-21 DIAGNOSIS — R0989 Other specified symptoms and signs involving the circulatory and respiratory systems: Secondary | ICD-10-CM

## 2014-01-21 DIAGNOSIS — R0609 Other forms of dyspnea: Secondary | ICD-10-CM

## 2014-01-21 DIAGNOSIS — R0600 Dyspnea, unspecified: Secondary | ICD-10-CM

## 2014-01-21 NOTE — Progress Notes (Signed)
Subjective:     Patient ID: Edward Nunez, male   DOB: 12-21-1925, 80 y.o.   MRN: [id number]  HPI 80 YO AA male presents to [facility] looking for a new referral to cardiology. He previously had a referral by Dr. Linna Darner but was not able to make the appt. He recently had an echocardiogram on 4/10 done at the [place] where he receives most of his care which showed to be abnormal with an EF of 35-40%. Today he states he is still short of breath on exertion and at rest. He denies chest pain, abdominal or leg swelling, and states that his appetite has improved.  Review of Systems  Constitutional: Positive for appetite change and unexpected weight change. Negative for fever, chills, diaphoresis and activity change.  HENT: Negative for congestion, ear pain, postnasal drip, rhinorrhea and sinus pressure.   Eyes: Negative for pain and itching.  Respiratory: Positive for stridor. Negative for cough, chest tightness and wheezing.   Cardiovascular: Negative for chest pain and palpitations.       Minor bilateral leg swelling intermittently  Gastrointestinal: Negative for nausea, vomiting, diarrhea, blood in stool and abdominal distention.  Genitourinary: Negative for hematuria.  Musculoskeletal: Positive for arthralgias and myalgias. Negative for joint swelling.  Neurological: Positive for weakness. Negative for dizziness, syncope and headaches.       Objective:   Physical Exam  Constitutional: He is oriented to person, place, and time. He appears well-developed. No distress.  Borderline cachectic  HENT:  Head: Normocephalic and atraumatic.  Eyes: Conjunctivae are normal. Pupils are equal, round, and reactive to light.  Cardiovascular: Normal rate, regular rhythm, normal heart sounds and intact distal pulses.  Exam reveals no gallop and no friction rub.   No murmur heard. Pulmonary/Chest: Effort normal. No respiratory distress. He has no wheezes. He has no rales.  Poor air exchange  Abdominal: Soft. Bowel  sounds are normal. He exhibits no distension. There is no tenderness. There is no rebound and no guarding.  Musculoskeletal: Normal range of motion. He exhibits no edema.  Neurological: He is alert and oriented to person, place, and time.  Skin: Skin is warm and dry. No rash noted. He is not diaphoretic. No erythema.  Psychiatric: He has a normal mood and affect. His behavior is normal.       Assessment:    CHF (congestive heart failure) - Plan: Ambulatory referral to Cardiology  Dyspnea       Plan:    follow up with cardiology referral Continue current medication  Follow up as needed

## 2014-01-21 NOTE — Patient Instructions (Signed)
Will get a call about cardiology appt Follow up with [facility] as needed

## 2014-02-09 ENCOUNTER — Institutional Professional Consult (permissible substitution): Payer: Medicare Other | Admitting: Internal Medicine

## 2014-02-26 ENCOUNTER — Encounter: Payer: Self-pay | Admitting: *Deleted

## 2014-02-26 DIAGNOSIS — R9431 Abnormal electrocardiogram [ECG] [EKG]: Secondary | ICD-10-CM

## 2014-05-17 ENCOUNTER — Inpatient Hospital Stay (HOSPITAL_COMMUNITY)
Admission: EM | Admit: 2014-05-17 | Discharge: 2014-05-26 | DRG: 291 | Disposition: A | Discharge status: Home Health | Payer: Medicare Other | Attending: Family Medicine | Admitting: Family Medicine

## 2014-05-17 ENCOUNTER — Emergency Department (HOSPITAL_COMMUNITY): Payer: Medicare Other

## 2014-05-17 ENCOUNTER — Encounter (HOSPITAL_COMMUNITY): Payer: Self-pay | Admitting: Emergency Medicine

## 2014-05-17 DIAGNOSIS — Z681 Body mass index (BMI) 19 or less, adult: Secondary | ICD-10-CM

## 2014-05-17 DIAGNOSIS — Z87891 Personal history of nicotine dependence: Secondary | ICD-10-CM | POA: Diagnosis not present

## 2014-05-17 DIAGNOSIS — E872 Acidosis, unspecified: Secondary | ICD-10-CM | POA: Diagnosis present

## 2014-05-17 DIAGNOSIS — I5043 Acute on chronic combined systolic (congestive) and diastolic (congestive) heart failure: Secondary | ICD-10-CM | POA: Diagnosis present

## 2014-05-17 DIAGNOSIS — I5023 Acute on chronic systolic (congestive) heart failure: Secondary | ICD-10-CM

## 2014-05-17 DIAGNOSIS — N184 Chronic kidney disease, stage 4 (severe): Secondary | ICD-10-CM

## 2014-05-17 DIAGNOSIS — I359 Nonrheumatic aortic valve disorder, unspecified: Secondary | ICD-10-CM | POA: Diagnosis present

## 2014-05-17 DIAGNOSIS — I509 Heart failure, unspecified: Secondary | ICD-10-CM | POA: Diagnosis present

## 2014-05-17 DIAGNOSIS — Z79899 Other long term (current) drug therapy: Secondary | ICD-10-CM | POA: Diagnosis not present

## 2014-05-17 DIAGNOSIS — J441 Chronic obstructive pulmonary disease with (acute) exacerbation: Secondary | ICD-10-CM

## 2014-05-17 DIAGNOSIS — N183 Chronic kidney disease, stage 3 unspecified: Secondary | ICD-10-CM | POA: Diagnosis present

## 2014-05-17 DIAGNOSIS — C61 Malignant neoplasm of prostate: Secondary | ICD-10-CM | POA: Diagnosis present

## 2014-05-17 DIAGNOSIS — N189 Chronic kidney disease, unspecified: Secondary | ICD-10-CM

## 2014-05-17 DIAGNOSIS — I495 Sick sinus syndrome: Secondary | ICD-10-CM | POA: Diagnosis present

## 2014-05-17 DIAGNOSIS — E43 Unspecified severe protein-calorie malnutrition: Secondary | ICD-10-CM | POA: Insufficient documentation

## 2014-05-17 DIAGNOSIS — I059 Rheumatic mitral valve disease, unspecified: Secondary | ICD-10-CM

## 2014-05-17 DIAGNOSIS — I2510 Atherosclerotic heart disease of native coronary artery without angina pectoris: Secondary | ICD-10-CM | POA: Diagnosis present

## 2014-05-17 DIAGNOSIS — J42 Unspecified chronic bronchitis: Secondary | ICD-10-CM

## 2014-05-17 DIAGNOSIS — J189 Pneumonia, unspecified organism: Secondary | ICD-10-CM | POA: Diagnosis present

## 2014-05-17 DIAGNOSIS — Z7982 Long term (current) use of aspirin: Secondary | ICD-10-CM

## 2014-05-17 DIAGNOSIS — R222 Localized swelling, mass and lump, trunk: Secondary | ICD-10-CM | POA: Diagnosis present

## 2014-05-17 DIAGNOSIS — J9600 Acute respiratory failure, unspecified whether with hypoxia or hypercapnia: Secondary | ICD-10-CM

## 2014-05-17 DIAGNOSIS — E785 Hyperlipidemia, unspecified: Secondary | ICD-10-CM

## 2014-05-17 DIAGNOSIS — R0602 Shortness of breath: Secondary | ICD-10-CM | POA: Diagnosis present

## 2014-05-17 DIAGNOSIS — R918 Other nonspecific abnormal finding of lung field: Secondary | ICD-10-CM

## 2014-05-17 DIAGNOSIS — I10 Essential (primary) hypertension: Secondary | ICD-10-CM

## 2014-05-17 DIAGNOSIS — I2589 Other forms of chronic ischemic heart disease: Secondary | ICD-10-CM | POA: Diagnosis present

## 2014-05-17 DIAGNOSIS — Z9861 Coronary angioplasty status: Secondary | ICD-10-CM | POA: Diagnosis not present

## 2014-05-17 DIAGNOSIS — N179 Acute kidney failure, unspecified: Secondary | ICD-10-CM | POA: Diagnosis present

## 2014-05-17 DIAGNOSIS — J449 Chronic obstructive pulmonary disease, unspecified: Secondary | ICD-10-CM

## 2014-05-17 DIAGNOSIS — I129 Hypertensive chronic kidney disease with stage 1 through stage 4 chronic kidney disease, or unspecified chronic kidney disease: Secondary | ICD-10-CM | POA: Diagnosis present

## 2014-05-17 DIAGNOSIS — I447 Left bundle-branch block, unspecified: Secondary | ICD-10-CM

## 2014-05-17 DIAGNOSIS — D649 Anemia, unspecified: Secondary | ICD-10-CM | POA: Diagnosis present

## 2014-05-17 DIAGNOSIS — J9601 Acute respiratory failure with hypoxia: Secondary | ICD-10-CM

## 2014-05-17 HISTORY — DX: Malignant (primary) neoplasm, unspecified: C80.1

## 2014-05-17 HISTORY — DX: Heart failure, unspecified: I50.9

## 2014-05-17 HISTORY — DX: Chronic obstructive pulmonary disease, unspecified: J44.9

## 2014-05-17 HISTORY — DX: Presence of coronary angioplasty implant and graft: Z95.5

## 2014-05-17 HISTORY — DX: Coronary angioplasty status: Z98.61

## 2014-05-17 LAB — CBC WITH DIFFERENTIAL/PLATELET
BASOS ABS: 0 10*3/uL (ref 0.0–0.1)
BASOS PCT: 0 % (ref 0–1)
BASOS PCT: 0 % (ref 0–1)
Basophils Absolute: 0 10*3/uL (ref 0.0–0.1)
EOS ABS: 0.2 10*3/uL (ref 0.0–0.7)
EOS PCT: 3 % (ref 0–5)
Eosinophils Absolute: 0 10*3/uL (ref 0.0–0.7)
Eosinophils Relative: 0 % (ref 0–5)
HCT: 35.9 % — ABNORMAL LOW (ref 39.0–52.0)
HEMATOCRIT: 31.9 % — AB (ref 39.0–52.0)
Hemoglobin: 10.6 g/dL — ABNORMAL LOW (ref 13.0–17.0)
Hemoglobin: 12.6 g/dL — ABNORMAL LOW (ref 13.0–17.0)
Lymphocytes Relative: 10 % — ABNORMAL LOW (ref 12–46)
Lymphocytes Relative: 11 % — ABNORMAL LOW (ref 12–46)
Lymphs Abs: 0.7 10*3/uL (ref 0.7–4.0)
Lymphs Abs: 0.6 10*3/uL — ABNORMAL LOW (ref 0.7–4.0)
MCH: 27.8 pg (ref 26.0–34.0)
MCH: 28.6 pg (ref 26.0–34.0)
MCHC: 33.2 g/dL (ref 30.0–36.0)
MCHC: 35.1 g/dL (ref 30.0–36.0)
MCV: 83.7 fL (ref 78.0–100.0)
MCV: 81.4 fL (ref 78.0–100.0)
MONO ABS: 0.5 10*3/uL (ref 0.1–1.0)
MONO ABS: 0.1 10*3/uL (ref 0.1–1.0)
Monocytes Relative: 6 % (ref 3–12)
Monocytes Relative: 2 % — ABNORMAL LOW (ref 3–12)
NEUTROS ABS: 5 10*3/uL (ref 1.7–7.7)
NEUTROS PCT: 87 % — AB (ref 43–77)
Neutro Abs: 6 10*3/uL (ref 1.7–7.7)
Neutrophils Relative %: 81 % — ABNORMAL HIGH (ref 43–77)
Platelets: 268 10*3/uL (ref 150–400)
Platelets: 271 10*3/uL (ref 150–400)
RBC: 3.81 MIL/uL — ABNORMAL LOW (ref 4.22–5.81)
RBC: 4.41 MIL/uL (ref 4.22–5.81)
RDW: 14.6 % (ref 11.5–15.5)
RDW: 14.6 % (ref 11.5–15.5)
WBC: 7.4 10*3/uL (ref 4.0–10.5)
WBC: 5.8 10*3/uL (ref 4.0–10.5)

## 2014-05-17 LAB — MAGNESIUM: MAGNESIUM: 1.5 mg/dL (ref 1.5–2.5)

## 2014-05-17 LAB — TROPONIN I: Troponin I: <0.3 ng/mL (ref <0.30)

## 2014-05-17 LAB — COMPREHENSIVE METABOLIC PANEL
ALBUMIN: 2.4 g/dL — AB (ref 3.5–5.2)
ALT: 38 U/L (ref 0–53)
ALT: 37 U/L (ref 0–53)
AST: 58 U/L — AB (ref 0–37)
AST: 43 U/L — ABNORMAL HIGH (ref 0–37)
Albumin: 2.9 g/dL — ABNORMAL LOW (ref 3.5–5.2)
Alkaline Phosphatase: 141 U/L — ABNORMAL HIGH (ref 39–117)
Alkaline Phosphatase: 155 U/L — ABNORMAL HIGH (ref 39–117)
Anion gap: 16 — ABNORMAL HIGH (ref 5–15)
Anion gap: 14 (ref 5–15)
BUN: 17 mg/dL (ref 6–23)
BUN: 17 mg/dL (ref 6–23)
CALCIUM: 9.5 mg/dL (ref 8.4–10.5)
CALCIUM: 9.7 mg/dL (ref 8.4–10.5)
CHLORIDE: 103 meq/L (ref 96–112)
CO2: 21 mEq/L (ref 19–32)
CO2: 25 meq/L (ref 19–32)
CREATININE: 1.49 mg/dL — AB (ref 0.50–1.35)
CREATININE: 1.36 mg/dL — AB (ref 0.50–1.35)
Chloride: 100 mEq/L (ref 96–112)
GFR calc Af Amer: 48 mL/min — ABNORMAL LOW (ref >90)
GFR calc non Af Amer: 41 mL/min — ABNORMAL LOW (ref >90)
GFR, EST AFRICAN AMERICAN: 53 mL/min — AB (ref >90)
GFR, EST NON AFRICAN AMERICAN: 46 mL/min — AB (ref >90)
GLUCOSE: 105 mg/dL — AB (ref 70–99)
Glucose, Bld: 134 mg/dL — ABNORMAL HIGH (ref 70–99)
Potassium: 3.7 mEq/L (ref 3.7–5.3)
Potassium: 3.6 mEq/L — ABNORMAL LOW (ref 3.7–5.3)
Sodium: 140 mEq/L (ref 137–147)
Sodium: 139 mEq/L (ref 137–147)
TOTAL PROTEIN: 7.3 g/dL (ref 6.0–8.3)
Total Bilirubin: 0.4 mg/dL (ref 0.3–1.2)
Total Bilirubin: 0.4 mg/dL (ref 0.3–1.2)
Total Protein: 8.3 g/dL (ref 6.0–8.3)

## 2014-05-17 LAB — INFLUENZA PANEL BY PCR (TYPE A & B)
H1N1FLUPCR: NOT DETECTED
Influenza A By PCR: NEGATIVE
Influenza B By PCR: NEGATIVE

## 2014-05-17 LAB — EXPECTORATED SPUTUM ASSESSMENT W GRAM STAIN, RFLX TO RESP C

## 2014-05-17 LAB — LACTIC ACID, PLASMA: Lactic Acid, Venous: 3.2 mmol/L — ABNORMAL HIGH (ref 0.5–2.2)

## 2014-05-17 LAB — PROTIME-INR
INR: 1.06 (ref 0.00–1.49)
PROTHROMBIN TIME: 13.8 s (ref 11.6–15.2)

## 2014-05-17 LAB — PRO B NATRIURETIC PEPTIDE: PRO B NATRI PEPTIDE: 10162 pg/mL — AB (ref 0–450)

## 2014-05-17 LAB — PHOSPHORUS: PHOSPHORUS: 2.8 mg/dL (ref 2.3–4.6)

## 2014-05-17 LAB — MRSA PCR SCREENING: MRSA BY PCR: NEGATIVE

## 2014-05-17 LAB — EXPECTORATED SPUTUM ASSESSMENT W REFEX TO RESP CULTURE

## 2014-05-17 LAB — APTT: aPTT: 32 seconds (ref 24–37)

## 2014-05-17 LAB — STREP PNEUMONIAE URINARY ANTIGEN: Strep Pneumo Urinary Antigen: NEGATIVE

## 2014-05-17 MED ORDER — SODIUM CHLORIDE 0.9 % IJ SOLN
3.0000 mL | Freq: Two times a day (BID) | INTRAMUSCULAR | Status: DC
  Administered 2014-05-18 – 2014-05-26 (×15): 3 mL via INTRAVENOUS

## 2014-05-17 MED ORDER — CARVEDILOL 3.125 MG PO TABS
3.1250 mg | ORAL_TABLET | Freq: Two times a day (BID) | ORAL | Status: DC
  Administered 2014-05-17 – 2014-05-18 (×2): 3.125 mg via ORAL
  Filled 2014-05-17 (×2): qty 1

## 2014-05-17 MED ORDER — SODIUM CHLORIDE 0.9 % IV SOLN
INTRAVENOUS | Status: AC
  Administered 2014-05-17: 11:00:00 via INTRAVENOUS

## 2014-05-17 MED ORDER — ATORVASTATIN CALCIUM 20 MG PO TABS
20.0000 mg | ORAL_TABLET | Freq: Every day | ORAL | Status: DC
  Administered 2014-05-17 – 2014-05-25 (×9): 20 mg via ORAL
  Filled 2014-05-17 (×3): qty 1
  Filled 2014-05-17: qty 2
  Filled 2014-05-17 (×7): qty 1

## 2014-05-17 MED ORDER — METHYLPREDNISOLONE SODIUM SUCC 40 MG IJ SOLR
40.0000 mg | INTRAMUSCULAR | Status: DC
  Administered 2014-05-17 – 2014-05-18 (×2): 40 mg via INTRAVENOUS
  Filled 2014-05-17 (×2): qty 1

## 2014-05-17 MED ORDER — DEXTROSE 5 % IV SOLN
500.0000 mg | Freq: Once | INTRAVENOUS | Status: DC
  Filled 2014-05-17: qty 500

## 2014-05-17 MED ORDER — ACETAMINOPHEN 325 MG PO TABS: 650.0000 mg | ORAL_TABLET | Freq: Four times a day (QID) | ORAL | Status: DC | PRN

## 2014-05-17 MED ORDER — IPRATROPIUM-ALBUTEROL 0.5-2.5 (3) MG/3ML IN SOLN
3.0000 mL | RESPIRATORY_TRACT | Status: DC | PRN
  Administered 2014-05-20 – 2014-05-21 (×3): 3 mL via RESPIRATORY_TRACT
  Filled 2014-05-17 (×5): qty 3

## 2014-05-17 MED ORDER — CETYLPYRIDINIUM CHLORIDE 0.05 % MT LIQD
7.0000 mL | Freq: Two times a day (BID) | OROMUCOSAL | Status: DC
  Administered 2014-05-18 – 2014-05-26 (×16): 7 mL via OROMUCOSAL

## 2014-05-17 MED ORDER — MORPHINE SULFATE 2 MG/ML IJ SOLN
1.0000 mg | INTRAMUSCULAR | Status: DC | PRN
  Administered 2014-05-19: 1 mg via INTRAVENOUS
  Filled 2014-05-17: qty 1

## 2014-05-17 MED ORDER — ACETAMINOPHEN 650 MG RE SUPP: 650.0000 mg | Freq: Four times a day (QID) | RECTAL | Status: DC | PRN

## 2014-05-17 MED ORDER — HYDRALAZINE HCL 10 MG PO TABS
10.0000 mg | ORAL_TABLET | Freq: Three times a day (TID) | ORAL | Status: DC
  Administered 2014-05-17 – 2014-05-18 (×3): 10 mg via ORAL
  Filled 2014-05-17 (×5): qty 1

## 2014-05-17 MED ORDER — ENOXAPARIN SODIUM 40 MG/0.4ML ~~LOC~~ SOLN
40.0000 mg | SUBCUTANEOUS | Status: DC
  Administered 2014-05-17: 40 mg via SUBCUTANEOUS
  Filled 2014-05-17: qty 0.4

## 2014-05-17 MED ORDER — ONDANSETRON HCL 4 MG PO TABS: 4.0000 mg | ORAL_TABLET | Freq: Four times a day (QID) | ORAL | Status: DC | PRN

## 2014-05-17 MED ORDER — DEXTROSE 5 % IV SOLN
1.0000 g | INTRAVENOUS | Status: AC
  Administered 2014-05-18 – 2014-05-23 (×6): 1 g via INTRAVENOUS
  Filled 2014-05-17 (×7): qty 10

## 2014-05-17 MED ORDER — DEXTROSE 5 % IV SOLN
500.0000 mg | INTRAVENOUS | Status: AC
  Administered 2014-05-17 – 2014-05-21 (×5): 500 mg via INTRAVENOUS
  Filled 2014-05-17 (×6): qty 500

## 2014-05-17 MED ORDER — ASPIRIN EC 81 MG PO TBEC
81.0000 mg | DELAYED_RELEASE_TABLET | Freq: Every day | ORAL | Status: DC
  Administered 2014-05-17 – 2014-05-26 (×10): 81 mg via ORAL
  Filled 2014-05-17 (×10): qty 1

## 2014-05-17 MED ORDER — ALLOPURINOL 300 MG PO TABS
300.0000 mg | ORAL_TABLET | Freq: Every day | ORAL | Status: DC
  Administered 2014-05-17 – 2014-05-26 (×10): 300 mg via ORAL
  Filled 2014-05-17 (×4): qty 1
  Filled 2014-05-17: qty 3
  Filled 2014-05-17 (×2): qty 1
  Filled 2014-05-17: qty 3
  Filled 2014-05-17 (×2): qty 1

## 2014-05-17 MED ORDER — DEXTROSE 5 % IV SOLN: 500.0000 mg | INTRAVENOUS | Status: DC

## 2014-05-17 MED ORDER — HYDROCODONE-ACETAMINOPHEN 5-325 MG PO TABS
1.0000 | ORAL_TABLET | ORAL | Status: DC | PRN
  Administered 2014-05-20: 1 via ORAL
  Administered 2014-05-22 (×2): 2 via ORAL
  Filled 2014-05-17 (×2): qty 2
  Filled 2014-05-17: qty 1

## 2014-05-17 MED ORDER — IPRATROPIUM-ALBUTEROL 0.5-2.5 (3) MG/3ML IN SOLN
3.0000 mL | RESPIRATORY_TRACT | Status: DC
  Administered 2014-05-17 (×2): 3 mL via RESPIRATORY_TRACT
  Filled 2014-05-17: qty 3

## 2014-05-17 MED ORDER — DEXTROSE 5 % IV SOLN
1.0000 g | Freq: Once | INTRAVENOUS | Status: AC
  Administered 2014-05-17: 1 g via INTRAVENOUS
  Filled 2014-05-17: qty 10

## 2014-05-17 MED ORDER — ONDANSETRON HCL 4 MG/2ML IJ SOLN: 4.0000 mg | Freq: Four times a day (QID) | INTRAMUSCULAR | Status: DC | PRN

## 2014-05-17 MED ORDER — IPRATROPIUM-ALBUTEROL 0.5-2.5 (3) MG/3ML IN SOLN
3.0000 mL | Freq: Four times a day (QID) | RESPIRATORY_TRACT | Status: DC
  Administered 2014-05-17 – 2014-05-25 (×30): 3 mL via RESPIRATORY_TRACT
  Filled 2014-05-17 (×30): qty 3

## 2014-05-17 MED ORDER — FUROSEMIDE 10 MG/ML IJ SOLN
40.0000 mg | Freq: Once | INTRAMUSCULAR | Status: AC
  Administered 2014-05-17: 40 mg via INTRAVENOUS
  Filled 2014-05-17: qty 4

## 2014-05-17 NOTE — ED Provider Notes (Signed)
CSN: [id number]     Arrival date & time 05/17/14  8250 History   First MD Initiated Contact with Patient 05/17/14 [id number]     Chief Complaint  Patient presents with  . Shortness of Breath     (Consider location/radiation/quality/duration/timing/severity/associated sxs/prior Treatment) HPI Comments: 80 year old male with history of high blood pressure, lipids, prostate cancer with no active treatment, renal insufficiency, COPD on inhaler at home, congestive heart failure and record however patient denies knowing this diagnosis presents with worsening dyspnea. Patient has felt short of breath for the past 3-4 months worse in the morning that improves with rest, patient is not very active however does have exertional shortness of breath. Patient felt that this morning shortness of breath did not improve like he normally does and gradually worsened. Patient is not on oxygen at home and was 81% on room air. Patient follows at the [facility] and has a lung biopsy appointment more O. Patient has had a productive cough mild for weeks. No fevers, no chest pain or chest pressure, no recent surgeries or blood clot history. Inpatient record it shows stent placement however patient cannot provide details. Patient is not on Lasix and has had weight loss recently. No significant orthopnea symptoms.  Patient is a 80 y.o. male presenting with shortness of breath. The history is provided by the patient and a relative.  Shortness of Breath Associated symptoms: cough   Associated symptoms: no abdominal pain, no chest pain, no fever, no headaches, no neck pain, no rash and no vomiting     Past Medical History  Diagnosis Date  . Hyperlipidemia   . Hypertension   . CHF (congestive heart failure)   . COPD (chronic obstructive pulmonary disease)   . Cancer     prostate   Past Surgical History  Procedure Laterality Date  . Radioactive seed implant     History reviewed. No pertinent family history. History  Substance Use  Topics  . Smoking status: Former Smoker    Quit date: 04/23/2007  . Smokeless tobacco: Not on file  . Alcohol Use: No    Review of Systems  Constitutional: Positive for fatigue. Negative for fever and chills.  HENT: Negative for congestion.   Eyes: Negative for visual disturbance.  Respiratory: Positive for cough and shortness of breath.   Cardiovascular: Negative for chest pain and leg swelling.  Gastrointestinal: Positive for nausea. Negative for vomiting and abdominal pain.  Genitourinary: Negative for dysuria and flank pain.  Musculoskeletal: Negative for back pain, neck pain and neck stiffness.  Skin: Negative for rash.  Neurological: Negative for light-headedness and headaches.      Allergies  Review of patient's allergies indicates no known allergies.  Home Medications   Prior to Admission medications   Medication Sig Start Date End Date Taking? Authorizing Provider  aspirin 325 MG EC tablet Take 325 mg by mouth daily.    Historical Provider, MD  aspirin 81 MG tablet Take 81 mg by mouth daily.    Historical Provider, MD  atorvastatin (LIPITOR) 40 MG tablet Take 20 mg by mouth daily.    Historical Provider, MD  diltiazem (TIAZAC) 360 MG 24 hr capsule Take 360 mg by mouth daily.    Historical Provider, MD  fosinopril (MONOPRIL) 40 MG tablet Take 40 mg by mouth 2 (two) times daily.    Historical Provider, MD  hydrochlorothiazide (HYDRODIURIL) 25 MG tablet Take 25 mg by mouth daily.    Historical Provider, MD  rosuvastatin (CRESTOR) 40 MG tablet Take  1 tablet (40 mg total) by mouth daily. 05/06/13   Mancel Bale, PA-C  terazosin (HYTRIN) 5 MG capsule Take 5 mg by mouth at bedtime.    Historical Provider, MD   There were no vitals taken for this visit. Physical Exam  Nursing note and vitals reviewed. Constitutional: He is oriented to person, place, and time. He appears well-developed and well-nourished.  HENT:  Head: Normocephalic and atraumatic.  Eyes: Right eye  exhibits no discharge. Left eye exhibits no discharge.  Neck: Normal range of motion. Neck supple. No tracheal deviation present.  Cardiovascular: Normal rate and regular rhythm.   Pulmonary/Chest: He has rales.  Patient has mild tachypnea, decreased breath sounds right mid and base, few rales at bases bilateral.  Abdominal: Soft. He exhibits no distension. There is no tenderness. There is no guarding.  Musculoskeletal: He exhibits no edema and no tenderness.  Neurological: He is alert and oriented to person, place, and time.  Skin: Skin is warm. No rash noted.  Psychiatric: He has a normal mood and affect.    ED Course  Procedures (including critical care time) Labs Review Labs Reviewed  PRO B NATRIURETIC PEPTIDE - Abnormal; Notable for the following:    Pro B Natriuretic peptide (BNP) 10162.0 (*)    All other components within normal limits  CBC WITH DIFFERENTIAL - Abnormal; Notable for the following:    RBC 3.81 (*)    Hemoglobin 10.6 (*)    HCT 31.9 (*)    Neutrophils Relative % 81 (*)    Lymphocytes Relative 10 (*)    All other components within normal limits  COMPREHENSIVE METABOLIC PANEL - Abnormal; Notable for the following:    Glucose, Bld 134 (*)    Creatinine, Ser 1.49 (*)    Albumin 2.4 (*)    AST 58 (*)    Alkaline Phosphatase 141 (*)    GFR calc non Af Amer 41 (*)    GFR calc Af Amer 48 (*)    Anion gap 16 (*)    All other components within normal limits  LACTIC ACID, PLASMA - Abnormal; Notable for the following:    Lactic Acid, Venous 3.2 (*)    All other components within normal limits  TROPONIN I    Imaging Review Dg Chest 2 View  05/17/2014   CLINICAL DATA:  Shortness of breath and COPD.  EXAM: CHEST  2 VIEW  COMPARISON:  12/06/2013  FINDINGS: Two views of the chest demonstrate hyperinflation and emphysema. There are increased patchy parenchymal densities throughout the right lung. Again noted is a more confluent density near the right lung apex. Increased  patchy densities at the left lung base. Difficult to exclude small pleural effusions. Heart size is grossly stable.  IMPRESSION: Patchy parenchymal disease throughout the right lung and probably involving the left lower lung. Findings are suggestive for multi-focal pneumonia. Probable small effusions.  Persistent density near the right lung apex is nonspecific. Recommend follow-up chest radiographs to ensure resolution.   Electronically Signed   By: Markus Daft M.D.   On: 05/17/2014 09:44     EKG Interpretation   Date/Time:  Sunday May 17 2014 08:36:27 EDT Ventricular Rate:  86 PR Interval:  192 QRS Duration: 158 QT Interval:  414 QTC Calculation: 495 R Axis:   56 Text Interpretation:  es Probable left atrial enlargement Left bundle  branch block Confirmed by Nelwyn Hebdon  MD, Corryn Madewell ([id number]) on 05/17/2014 9:02:34 AM      MDM   Final  diagnoses:  Community acquired pneumonia  LBBB (left bundle branch block)  Congestive heart failure, unspecified congestive heart failure chronicity, unspecified congestive heart failure type  Lactic acidosis  CRF (chronic renal failure), unspecified stage   Patient with worsening dyspnea and hypoxia requiring 3-4 L nasal cannula in ER which is new for him. Differential diagnosis includes pneumonia, heart failure, pleural effusion, worsening CAD, pericardial effusion, other. Plan for cardiac workup with chest x-ray for further delineation of exam and admission.  Patient requiring 3 L oxygen in ER. Chest x-ray reviewed showing left-sided infiltrate consistent with pneumonia. Patient does have elevated BNP as well as heart failure history. No IV fluids given at this time. Mild lactic acidosis. Discussed with [facility]ad for admission for [facility] pneumonia and heart failure, step down to start.  The patients results and plan were reviewed and discussed.   Any x-rays performed were personally reviewed by myself.   Differential diagnosis were considered with the  presenting HPI.  Medications  allopurinol (ZYLOPRIM) tablet 300 mg (300 mg Oral Given 05/17/14 1308)  atorvastatin (LIPITOR) tablet 20 mg (not administered)  carvedilol (COREG) tablet 3.125 mg (not administered)  aspirin EC tablet 81 mg (81 mg Oral Given 05/17/14 1308)  enoxaparin (LOVENOX) injection 40 mg (not administered)  sodium chloride 0.9 % injection 3 mL (3 mLs Intravenous Not Given 05/17/14 1309)  0.9 %  sodium chloride infusion ( Intravenous New Bag/Given 05/17/14 1106)  acetaminophen (TYLENOL) tablet 650 mg (not administered)    Or  acetaminophen (TYLENOL) suppository 650 mg (not administered)  HYDROcodone-acetaminophen (NORCO/VICODIN) 5-325 MG per tablet 1-2 tablet (not administered)  morphine 2 MG/ML injection 1 mg (not administered)  ondansetron (ZOFRAN) tablet 4 mg (not administered)    Or  ondansetron (ZOFRAN) injection 4 mg (not administered)  ipratropium-albuterol (DUONEB) 0.5-2.5 (3) MG/3ML nebulizer solution 3 mL (not administered)  ipratropium-albuterol (DUONEB) 0.5-2.5 (3) MG/3ML nebulizer solution 3 mL (3 mLs Nebulization Given 05/17/14 1333)  cefTRIAXone (ROCEPHIN) 1 g in dextrose 5 % 50 mL IVPB (1 g Intravenous Not Given 05/17/14 1059)  hydrALAZINE (APRESOLINE) tablet 10 mg (10 mg Oral Given 05/17/14 1310)  methylPREDNISolone sodium succinate (SOLU-MEDROL) 40 mg/mL injection 40 mg (40 mg Intravenous Given 05/17/14 1106)  azithromycin (ZITHROMAX) 500 mg in dextrose 5 % 250 mL IVPB (500 mg Intravenous Given 05/17/14 1257)  cefTRIAXone (ROCEPHIN) 1 g in dextrose 5 % 50 mL IVPB (1 g Intravenous New Bag/Given 05/17/14 1106)  furosemide (LASIX) injection 40 mg (40 mg Intravenous Given 05/17/14 1106)      Filed Vitals:   05/17/14 1310 05/17/14 1333 05/17/14 1345 05/17/14 1440  BP: 152/87  135/84 142/70  Pulse:  104 103 90  Temp:   97.4 F (36.3 C)   TempSrc:   Oral   Resp:  15 36 29  Height:      Weight:      SpO2:   99% 98%    Admission/ observation were discussed with the  admitting physician, patient and/or family and they are comfortable with the plan.      Mariea Clonts, MD 05/17/14 1538

## 2014-05-17 NOTE — ED Notes (Signed)
MD at bedside. 

## 2014-05-17 NOTE — ED Notes (Signed)
Bed: SE39 Expected date: 05/17/14 Expected time: 8:26 AM Means of arrival: Ambulance Comments: 80 yo [facility]

## 2014-05-17 NOTE — ED Notes (Addendum)
Per EMS, pt from home.  Pt has hx of dyspnea with exertion.  Pt today has been short of breath even with rest.  Pt does not wear oxygen.  HX CHF.  Pt given inhaler 2 weeks ago by [facility].  81 % ra initial.  Pt started on 4l per [place].  94 % highest, average 87-88%.  Ending 3l per [place].  Has MD appt at [facility] in am for follow up.  Pt has had active productive cough for a while.  Vitals: 126 68, hr 90,

## 2014-05-17 NOTE — H&P (Signed)
[facility] History and Physical  Edward Nunez YNW:[id number] DOB: 1925-11-26 DOA: 05/17/2014  Referring physician: ER physician PCP: [name], MD   Chief Complaint: shortness of breath   HPI:  Pt is 80 yo male with HTN, HLD, prostate cancer but on no active treatment, CKD stage II - III, COPD, severe combined systolic and diastolic CHF (EF 65%), presented to [facility] ED with main concern of progressively worsening shortness of breath, worse with exertion and somewhat better with resting but no other alleviating factors. Pt denies fevers, chills, chest pain, no specific abdominal or urinary concerns.   In ED, pt noted to be hemodynamically stable but hypoxic with oxygen saturations in mid 80's or RA (please note he is not on oxygen at home). BMP notable for Cr 1.49. TRH asked to admit to SDU for further evaluation and management.   Assessment & Plan    Principal Problem:   Acute respiratory failure with hypoxia  Likely combination of community acquired pneumonia, acute COPD exacerbation and acute on chronic diastolic CHF  Will admit to SDU for the first 24 hour observation  In regards to pneumonia, pneumonia order set in place. Continue azithromycin and rocephin.  Follow up blood culture results, legionella, strep pneumoniae, Influenza, resp culture results.  Started duoneb scheduled and as needed for COPD. Will add low dose solu-medrol, 40 gm IV daily. COPD gold alert order placed.   Oxygen support via [place] to keep O2 saturation above 90%  For CHF, will give 1 dose lasix 40 mg IV, daily weight, strict intake and output, repeat 2 D ECHO Active Problems:   COPD (chronic obstructive pulmonary disease)  Continue duoneb every 2 hours PRN and every 4 hours scheduled  Start solumedrol 40 mg IV Q 24 hours  Abx for pneumonia   Oxygen support via [place] to keep O2 saturation above 90%  COPD gold alert order placed    Community acquired pneumonia  Pneumonia order set in  place.  Continue azithromycin and rocephin  Follow up blood and resp culutre results, legionella, influenza, strep penumoniae   Systolic and diastolic CHF, acute on chronic  BNP elevated in 10,000 range which may be also due to renal insufficiency.  Order placed for 1 dose lasix 40 mg IV. Daily weight, strict intake and output.  Replace electrolytes as needed    HTN (hypertension)  Hold fosinopril and hctz   BP on lower side so hold antihypertensives for now  Pt will get 1 dose of lasix 40 mg IV   Hyperlipidemia  Continue statin therapy    Chronic kidney disease, stage IV  Has had elevated creatinine 1.5 in 2013. This admission creatinine lower than baseline value    DVT prophylaxis:   Lovenox subQ while pt is in hospital   Radiological Exams on Admission: Dg Chest 2 View 05/17/2014   IMPRESSION: Patchy parenchymal disease throughout the right lung and probably involving the left lower lung. Findings are suggestive for multi-focal pneumonia. Probable small effusions.  Persistent density near the right lung apex is nonspecific. Recommend follow-up chest radiographs to ensure resolution.      Code Status: Full Family Communication: Plan of care discussed with the patient  Disposition Plan: Admit for further evaluation; admission to SDU for first 24 hour observation.  [name], MD  [facility] Pager [phone]  Review of Systems:  Constitutional: Negative for fever, chills and malaise/fatigue. Negative for diaphoresis.  HENT: Negative for hearing loss, ear pain, nosebleeds, congestion, sore throat, neck pain, tinnitus and ear  discharge.   Eyes: Negative for blurred vision, double vision, photophobia, pain, discharge and redness.  Respiratory: per HPI   Cardiovascular: Negative for chest pain, palpitations, orthopnea, claudication and leg swelling.  Gastrointestinal: Negative for nausea, vomiting and abdominal pain. Negative for heartburn, constipation, blood in  stool and melena.  Genitourinary: Negative for dysuria, urgency, frequency, hematuria and flank pain.  Musculoskeletal: Negative for myalgias, back pain, joint pain and falls.  Skin: Negative for itching and rash.  Neurological: Negative for dizziness and weakness. Negative for tingling, tremors, sensory change, speech change, focal weakness, loss of consciousness and headaches.  Endo/Heme/Allergies: Negative for environmental allergies and polydipsia. Does not bruise/bleed easily.  Psychiatric/Behavioral: Negative for suicidal ideas. The patient is not nervous/anxious.      Past Medical History  Diagnosis Date  . Hyperlipidemia   . Hypertension   . CHF (congestive heart failure)   . COPD (chronic obstructive pulmonary disease)   . Cancer     prostate  . Postsurgical percutaneous transluminal coronary angioplasty (PTCA) status   . S/P coronary artery stent placement    Past Surgical History  Procedure Laterality Date  . Radioactive seed implant     Social History:  reports that he quit smoking about 7 years ago. He does not have any smokeless tobacco history on file. He reports that he does not drink alcohol or use illicit drugs.  No Known Allergies  Family History: hypertension in parents   Prior to Admission medications   Medication Sig Start Date End Date Taking? Authorizing Provider  albuterol-ipratropium (COMBIVENT) 18-103 MCG/ACT inhaler Inhale 1 puff into the lungs every 4 (four) hours.   Yes Historical Provider, MD  allopurinol (ZYLOPRIM) 300 MG tablet Take 300 mg by mouth daily.   Yes Historical Provider, MD  aspirin 81 MG tablet Take 81 mg by mouth daily.   Yes Historical Provider, MD  atorvastatin (LIPITOR) 20 MG tablet Take 20 mg by mouth daily.   Yes Historical Provider, MD  carvedilol (COREG) 3.125 MG tablet Take 3.125 mg by mouth 2 (two) times daily with a meal.   Yes Historical Provider, MD  fosinopril (MONOPRIL) 40 MG tablet Take 40 mg by mouth 2 (two) times  daily.   Yes Historical Provider, MD  hydrochlorothiazide (HYDRODIURIL) 25 MG tablet Take 25 mg by mouth daily.   Yes Historical Provider, MD   Physical Exam: There were no vitals filed for this visit.  Physical Exam  Constitutional: Appears well-developed and well-nourished. No distress.  HENT: Normocephalic. No tonsillar erythema or exudates Eyes: Conjunctivae and EOM are normal. PERRLA, no scleral icterus.  Neck: Normal ROM. Neck supple. No JVD. No tracheal deviation. No thyromegaly.  CVS: RRR, S1/S2 +, no murmurs, no gallops, no carotid bruit.  Pulmonary: Effort and breath sounds normal, no stridor, rhonchi, wheezes, rales.  Abdominal: Soft. BS +,  no distension, tenderness, rebound or guarding.  Musculoskeletal: Normal range of motion. No edema and no tenderness.  Lymphadenopathy: No lymphadenopathy noted, cervical, inguinal. Neuro: Alert. Normal reflexes, muscle tone coordination. No focal neurologic deficits. Skin: Skin is warm and dry. No rash noted. Not diaphoretic. No erythema. No pallor.  Psychiatric: Normal mood and affect. Behavior, judgment, thought content normal.   Labs on Admission:  Basic Metabolic Panel:  Recent Labs Lab 05/17/14 0902  NA 140  K 3.7  CL 103  CO2 21  GLUCOSE 134*  BUN 17  CREATININE 1.49*  CALCIUM 9.5   Liver Function Tests:  Recent Labs Lab 05/17/14 0902  AST  58*  ALT 38  ALKPHOS 141*  BILITOT 0.4  PROT 7.3  ALBUMIN 2.4*   No results found for this basename: LIPASE, AMYLASE,  in the last 168 hours No results found for this basename: AMMONIA,  in the last 168 hours CBC:  Recent Labs Lab 05/17/14 0902  WBC 7.4  NEUTROABS 6.0  HGB 10.6*  HCT 31.9*  MCV 83.7  PLT 268   Cardiac Enzymes:  Recent Labs Lab 05/17/14 0902  TROPONINI <0.30   BNP: No components found with this basename: POCBNP,  CBG: No results found for this basename: GLUCAP,  in the last 168 hours  If 7PM-7AM, please contact  night-coverage [email or web address] Password [email or web address] 05/17/2014, 10:48 AM

## 2014-05-17 NOTE — Progress Notes (Signed)
Echo Lab  2D Echocardiogram completed.  Post, RDCS 05/17/2014 11:20 AM

## 2014-05-18 DIAGNOSIS — N184 Chronic kidney disease, stage 4 (severe): Secondary | ICD-10-CM

## 2014-05-18 DIAGNOSIS — E43 Unspecified severe protein-calorie malnutrition: Secondary | ICD-10-CM | POA: Insufficient documentation

## 2014-05-18 LAB — LEGIONELLA ANTIGEN, URINE: Legionella Antigen, Urine: NEGATIVE

## 2014-05-18 LAB — CBC
HCT: 32.2 % — ABNORMAL LOW (ref 39.0–52.0)
HEMOGLOBIN: 10.8 g/dL — AB (ref 13.0–17.0)
MCH: 27.3 pg (ref 26.0–34.0)
MCHC: 33.5 g/dL (ref 30.0–36.0)
MCV: 81.5 fL (ref 78.0–100.0)
PLATELETS: 249 10*3/uL (ref 150–400)
RBC: 3.95 MIL/uL — ABNORMAL LOW (ref 4.22–5.81)
RDW: 14.6 % (ref 11.5–15.5)
WBC: 6.2 10*3/uL (ref 4.0–10.5)

## 2014-05-18 LAB — URINE CULTURE
Colony Count: NO GROWTH
Culture: NO GROWTH

## 2014-05-18 LAB — COMPREHENSIVE METABOLIC PANEL
ALK PHOS: 118 U/L — AB (ref 39–117)
ALT: 22 U/L (ref 0–53)
ANION GAP: 15 (ref 5–15)
AST: 17 U/L (ref 0–37)
Albumin: 2.5 g/dL — ABNORMAL LOW (ref 3.5–5.2)
BUN: 27 mg/dL — AB (ref 6–23)
CO2: 22 mEq/L (ref 19–32)
Calcium: 9.4 mg/dL (ref 8.4–10.5)
Chloride: 107 mEq/L (ref 96–112)
Creatinine, Ser: 1.72 mg/dL — ABNORMAL HIGH (ref 0.50–1.35)
GFR calc non Af Amer: 34 mL/min — ABNORMAL LOW (ref >90)
GFR, EST AFRICAN AMERICAN: 40 mL/min — AB (ref >90)
GLUCOSE: 112 mg/dL — AB (ref 70–99)
POTASSIUM: 3.5 meq/L — AB (ref 3.7–5.3)
Sodium: 144 mEq/L (ref 137–147)
Total Bilirubin: 0.2 mg/dL — ABNORMAL LOW (ref 0.3–1.2)
Total Protein: 7 g/dL (ref 6.0–8.3)

## 2014-05-18 LAB — GLUCOSE, CAPILLARY: GLUCOSE-CAPILLARY: 93 mg/dL (ref 70–99)

## 2014-05-18 MED ORDER — ENSURE COMPLETE PO LIQD
237.0000 mL | Freq: Three times a day (TID) | ORAL | Status: DC
  Administered 2014-05-18 – 2014-05-26 (×22): 237 mL via ORAL

## 2014-05-18 MED ORDER — ISOSORB DINITRATE-HYDRALAZINE 20-37.5 MG PO TABS
0.5000 | ORAL_TABLET | Freq: Three times a day (TID) | ORAL | Status: DC
  Administered 2014-05-18 – 2014-05-19 (×6): 0.5 via ORAL
  Filled 2014-05-18 (×9): qty 0.5

## 2014-05-18 MED ORDER — BISOPROLOL FUMARATE 5 MG PO TABS
2.5000 mg | ORAL_TABLET | Freq: Every day | ORAL | Status: DC
  Administered 2014-05-18 – 2014-05-20 (×3): 2.5 mg via ORAL
  Filled 2014-05-18 (×2): qty 1
  Filled 2014-05-18 (×2): qty 0.5

## 2014-05-18 MED ORDER — POTASSIUM CHLORIDE CRYS ER 20 MEQ PO TBCR
40.0000 meq | EXTENDED_RELEASE_TABLET | Freq: Once | ORAL | Status: AC
  Administered 2014-05-18: 40 meq via ORAL
  Filled 2014-05-18: qty 2

## 2014-05-18 MED ORDER — PHENOL 1.4 % MT LIQD
1.0000 | OROMUCOSAL | Status: DC | PRN
  Administered 2014-05-18 – 2014-05-23 (×2): 1 via OROMUCOSAL
  Filled 2014-05-18: qty 177

## 2014-05-18 MED ORDER — ENOXAPARIN SODIUM 30 MG/0.3ML ~~LOC~~ SOLN
30.0000 mg | SUBCUTANEOUS | Status: DC
  Administered 2014-05-18: 30 mg via SUBCUTANEOUS
  Filled 2014-05-18: qty 0.3

## 2014-05-18 MED ORDER — FUROSEMIDE 10 MG/ML IJ SOLN
40.0000 mg | Freq: Once | INTRAMUSCULAR | Status: AC
  Administered 2014-05-18: 40 mg via INTRAVENOUS
  Filled 2014-05-18: qty 4

## 2014-05-18 NOTE — Progress Notes (Addendum)
Patient ID: Edward Nunez, male   DOB: 1926/07/26, 80 y.o.   MRN: [id number] [facility] PROGRESS NOTE  Savaughn Karwowski FAO:[id number] DOB: 05-01-1926 DOA: 05/17/2014 PCP: [name], MD  Brief narrative: Pt is 80 yo male with HTN, HLD, prostate cancer but on no active treatment, CKD stage II - III, COPD, severe combined systolic and diastolic CHF (EF 69%), presented to [facility] ED with main concern of progressively worsening shortness of breath, worse with exertion and somewhat better with resting but no other alleviating factors. Pt denied fevers, chills, chest pain. In ED, pt noted to be hemodynamically stable but hypoxic with oxygen saturations in mid 80's or RA (please note he is not on oxygen at home). BMP notable for Cr 1.49. TRH asked to admit to SDU for further evaluation and management.   Assessment & Plan   Principal Problem:  Acute respiratory failure with hypoxia  Likely combination of community acquired pneumonia, acute COPD exacerbation and acute on chronic diastolic CHF  Respiratory status is stable and patient clinically feels better. We will continue current antibiotic regimen, azithromycin and rocephin for treatment of community acquired pneumonia. Follow up blood culture results, legionella and strep pneumonia results. Continue duoneb every 6 hours scheduled and every 2 hours PRN shortness of breath or wheezing for COPD exacerbation. Pt also getting once a day solumedrol 40 mg IV. Given 1 dose of IV lasix on admission and since BP is stable we will give additional 40 mg IV lasix today. Appreciate cardio consult and recommendations.  Oxygen support via [place] to keep O2 saturation above 90%   Active Problems:  Tachy-brady / irregular hear rate  Possible atrial fibrillation. Obtain 12 lead EKG.  No complaints of chest pain  COPD (chronic obstructive pulmonary disease)  Continue duoneb every 6 hours scheduled and every 2 hours PRN. Continue solumedrol 40 mg IV daily for next 24  hours. Continue abx for pneumonia, azithromycin and rocephin. Oxygen support via [place] to keep O2 saturation above 90%  COPD gold alert order placed  Community acquired pneumonia  Continue azithromycin and rocephin  Follow up blood and resp culutre results, legionella, influenza, strep penumoniae Systolic and diastolic CHF, acute on chronic  BNP elevated in 10,000 range which may be also due to renal insufficiency.  Order placed for 1 dose lasix 40 mg IV on admission and we will give additional 40 mg IV lasix today. Daily weight, strict intake and output. Admission weight 62.1 kg Replace electrolytes as needed  HTN (hypertension)  Hold fosinopril and hctz  BP on admission on lower side so held antihypertensives. Will give 1 dose of lasix (has gotten 40 mg IV on admission) Appreciate cardio input on BP meds.  Hyperlipidemia  Continue statin therapy  Chronic kidney disease, stage IV  Has had elevated creatinine 1.5 in 2013. This admission creatinine lower than baseline value  DVT Prophylaxis   Lovenox subQ while pt is in hospital    Code Status: Full.  Family Communication:  plan of care discussed with the patient Disposition Plan: Home when stable.    IV Access:   Peripheral IV Procedures and diagnostic studies:    Dg Chest 2 View  05/17/2014  IMPRESSION: Patchy parenchymal disease throughout the right lung and probably involving the left lower lung. Findings are suggestive for multi-focal pneumonia. Probable small effusions.  Persistent density near the right lung apex is nonspecific. Recommend follow-up chest radiographs to ensure resolution.    Medical Consultants:   Cardiology  Other Consultants:   None  Anti-Infectives:   Azithromycin 05/17/2014 --> Rocephin 05/17/2014 -->   [name], MD  [facility] Pager [phone]  If 7PM-7AM, please contact night-coverage [email or web address] Password [email or web address] 05/18/2014, 8:19 AM   LOS: 1 day    HPI/Subjective: No acute overnight  events.  Objective: Filed Vitals:   05/18/14 0000 05/18/14 0200 05/18/14 0400 05/18/14 0559  BP:  115/72 145/75 145/75  Pulse:  82 42   Temp: 98.6 F (37 C)  98.2 F (36.8 C)   TempSrc: Oral  Oral   Resp:  26 25   Height:      Weight:   62.1 kg (136 lb 14.5 oz)   SpO2:  100% 100%     Intake/Output Summary (Last 24 hours) at 05/18/14 0819 Last data filed at 05/18/14 0700  Gross per 24 hour  Intake    430 ml  Output   1625 ml  Net  -1195 ml    Exam:   General:  Pt is alert, follows commands appropriately, not in acute distress  Cardiovascular: irregular rhythm, S1/S2, no murmurs  Respiratory: basilar crackles, mild wheezing in upper lung lobes  Abdomen: Soft, non tender, non distended, bowel sounds present  Extremities: very minimal pedal edema, pulses DP and PT palpable bilaterally  Neuro: Grossly nonfocal  Data Reviewed: Basic Metabolic Panel:  Recent Labs Lab 05/17/14 0902 05/17/14 1315 05/18/14 0400  NA 140 139 144  K 3.7 3.6* 3.5*  CL 103 100 107  CO2 21 25 22   GLUCOSE 134* 105* 112*  BUN 17 17 27*  CREATININE 1.49* 1.36* 1.72*  CALCIUM 9.5 9.7 9.4  MG  --  1.5  --   PHOS  --  2.8  --    Liver Function Tests:  Recent Labs Lab 05/17/14 0902 05/17/14 1315 05/18/14 0400  AST 58* 43* 17  ALT 38 37 22  ALKPHOS 141* 155* 118*  BILITOT 0.4 0.4 0.2*  PROT 7.3 8.3 7.0  ALBUMIN 2.4* 2.9* 2.5*   No results found for this basename: LIPASE, AMYLASE,  in the last 168 hours No results found for this basename: AMMONIA,  in the last 168 hours CBC:  Recent Labs Lab 05/17/14 0902 05/17/14 1315 05/18/14 0400  WBC 7.4 5.8 6.2  NEUTROABS 6.0 5.0  --   HGB 10.6* 12.6* 10.8*  HCT 31.9* 35.9* 32.2*  MCV 83.7 81.4 81.5  PLT 268 271 249   Cardiac Enzymes:  Recent Labs Lab 05/17/14 0902  TROPONINI <0.30   BNP: No components found with this basename: POCBNP,  CBG: No results found for this basename: GLUCAP,  in the last 168 hours  MRSA PCR  SCREENING     Status: None   Collection Time    05/17/14 12:45 PM      Result Value Ref Range Status   MRSA by PCR NEGATIVE  NEGATIVE Final  CULTURE, EXPECTORATED SPUTUM-ASSESSMENT     Status: None   Collection Time    05/17/14  3:30 PM      Result Value Ref Range Status   Specimen Description SPUTUM   Final   Special Requests NONE   Final   Sputum evaluation     Final   Value: THIS SPECIMEN IS ACCEPTABLE. RESPIRATORY CULTURE REPORT TO FOLLOW.   Report Status 05/17/2014 FINAL   Final     Scheduled Meds: . allopurinol  300 mg Oral Daily  . aspirin EC  81 mg Oral Daily  . atorvastatin  20 mg Oral q1800  . azithromycin  500  mg Intravenous Q24H  . carvedilol  3.125 mg Oral BID WC  . cefTRIAXone   1 g Intravenous Q24H  . enoxaparin (LOVENOX) injection  30 mg Subcutaneous Q24H  . furosemide  40 mg Intravenous Once  . hydrALAZINE  10 mg Oral 3 times per day  . ipratropium-albuterol  3 mL Nebulization QID  . methylPREDNISolone   40 mg Intravenous Q24H  . potassium chloride  40 mEq Oral Once   Continuous Infusions: . sodium chloride 10 mL/hr at 05/18/14 0700

## 2014-05-18 NOTE — Progress Notes (Signed)
OT Cancellation Note  Patient Details Name: Edward Nunez MRN: [id number] DOB: 1925/12/08   Cancelled Treatment:    Reason Eval/Treat Not Completed: Fatigue/lethargy limiting ability to participate Pt just had gotten back to bed. Will see pt next day  Betsy Pries 05/18/2014, 2:53 PM

## 2014-05-18 NOTE — Evaluation (Signed)
Physical Therapy Evaluation Patient Details Name: Edward Nunez MRN: [id number] DOB: 03-12-1926 Today's Date: 05/18/2014   History of Present Illness  80 yo with dyspnea and hypoxia, pna ; PMHx: chronic systolic CHF and probable ischemic cardiomyopathy, COPD, CKD, EF 20%  Clinical Impression  Pt will benefit from PT to address deficits below; plan is for HHPT vs no f/u depending on progress, may need need a cane if gait continues to be unsteady;     Follow Up Recommendations Home health PT;No PT follow up    Equipment Recommendations  Cane (??cane if not more steady next visit)    Recommendations for Other Services       Precautions / Restrictions Precautions Precautions: Fall      Mobility  Bed Mobility Overal bed mobility: Needs Assistance Bed Mobility: Supine to Sit     Supine to sit: Supervision     General bed mobility comments: for lines and safety  Transfers Overall transfer level: Needs assistance   Transfers: Sit to/from Stand Sit to Stand: Min assist         General transfer comment: cues for hand placement and safety  Ambulation/Gait Ambulation/Gait assistance: Min assist Ambulation Distance (Feet): 22 Feet Assistive device: None;1 person hand held assist Gait Pattern/deviations: Step-through pattern;Decreased stride length;Wide base of support     General Gait Details: cues for breathing, assist for balance  Stairs            Wheelchair Mobility    Modified Rankin (Stroke Patients Only)       Balance                                             Pertinent Vitals/Pain  denies pain       Rest --exertion--after HR 95--117--90 sats 97---96--95 on RA RR 22--27--19     Home Living Family/patient expects to be discharged to:: Private residence Living Arrangements: Spouse/significant other;Children   Type of Home: House       Home Layout: Two level;Bed/bath upstairs Home Equipment: None Additional  Comments: pt is quite independent and still drives at his baseline    Prior Function Level of Independence: Independent               Hand Dominance        Extremity/Trunk Assessment   Upper Extremity Assessment: Defer to OT evaluation           Lower Extremity Assessment: Generalized weakness         Communication   Communication: No difficulties  Cognition Arousal/Alertness: Awake/alert Behavior During Therapy: WFL for tasks assessed/performed Overall Cognitive Status: Within Functional Limits for tasks assessed                      General Comments      Exercises        Assessment/Plan    PT Assessment Patient needs continued PT services  PT Diagnosis Difficulty walking   PT Problem List Decreased activity tolerance;Decreased balance;Decreased mobility;Cardiopulmonary status limiting activity  PT Treatment Interventions DME instruction;Gait training;Functional mobility training;Stair training;Therapeutic activities;Therapeutic exercise;Balance training;Patient/family education   PT Goals (Current goals can be found in the Care Plan section) Acute Rehab PT Goals Patient Stated Goal: I PT Goal Formulation: With patient Time For Goal Achievement: 05/25/14 Potential to Achieve Goals: Good    Frequency Min 3X/week   Barriers to  discharge        Co-evaluation               End of Session Equipment Utilized During Treatment: Gait belt Activity Tolerance: Patient limited by fatigue Patient left: in chair;with call bell/phone within reach           Time: 1210 (and 1300-1305)-1231 PT Time Calculation (min): 21 min   Charges:   PT Evaluation $Initial PT Evaluation Tier I: 1 Procedure PT Treatments $Gait Training: 8-22 mins   PT G Codes:          Edward Nunez May 22, 2014, 1:20 PM

## 2014-05-18 NOTE — Consult Note (Signed)
CARDIOLOGY CONSULT NOTE  Patient ID: Edward Nunez MRN: [id number] DOB/AGE: 1925-10-11 80 y.o.  Admit date: 05/17/2014 Primary Physician: Dr. Everlene Farrier Primary Cardiologist: New  Reason for Consultation: CHF  HPI: 80 yo with history of chronic systolic CHF and probable ischemic cardiomyopathy as well as COPD and CKD was admitted with dyspnea and hypoxia. For 5-6 weeks, he has noted dyspnea walking up stairs.  Otherwise he has been doing ok.  He lives with his wife and daughter and despite his medical problems is quite functional.  Still drives.  On Saturday, he noticed very severe dyspnea at rest.  He went to the ER and was noted by CXR to have multifocal PNA.  He is being treated for PNA and COPD exacerbation.  He has received 2 doses of IV Lasix with some diuresis.  Creatinine has risen to 1.72.  He is feeling better and is afebrile.  Echo yesterday showed EF 20%.  This is lower than EF on last echo at the [place] (35%).  Patient denies any chest pain.   Review of systems complete and found to be negative unless listed above in HPI  Past Medical History: 1. Cardiomyopathy: Echo (8/15) with EF 20%, global hypokinesis, inferior akinesis, probably low flow low gradient moderate AS.  Possibly ischemic cardiomyopathy.  Prior echo at [place] with EF 35%.  2. Aortic stenosis: Suspect low flow low gradient moderate AS on 8/15 echo. 3. HTN 4. Hyperlipidemia 5. Prostate cancer s/p seed implantation.  6. CKD 7. COPD: Not on home oxygen 8. CAD: PCI in distant past, not sure which vessel.   FH: HTN, hyperlipidemia  History   Social History  . Marital Status: Married    Spouse Name: N/A    Number of Children: N/A  . Years of Education: N/A   Occupational History  . Not on file.   Social History Main Topics  . Smoking status: Former Smoker    Quit date: 04/23/2007  . Smokeless tobacco: Not on file  . Alcohol Use: No  . Drug Use: No  . Sexual Activity: Not on file   Other Topics Concern  . Not  on file   Social History Narrative  . No narrative on file     Prescriptions prior to admission  Medication Sig Dispense Refill  . albuterol-ipratropium (COMBIVENT) 18-103 MCG/ACT inhaler Inhale 1 puff into the lungs every 4 (four) hours.      [name] allopurinol (ZYLOPRIM) 300 MG tablet Take 300 mg by mouth daily.      [name] aspirin 81 MG tablet Take 81 mg by mouth daily.      [name] atorvastatin (LIPITOR) 20 MG tablet Take 20 mg by mouth daily.      . carvedilol (COREG) 3.125 MG tablet Take 3.125 mg by mouth 2 (two) times daily with a meal.      . fosinopril (MONOPRIL) 40 MG tablet Take 40 mg by mouth 2 (two) times daily.      . hydrochlorothiazide (HYDRODIURIL) 25 MG tablet Take 25 mg by mouth daily.       Current Scheduled Meds: . allopurinol  300 mg Oral Daily  . antiseptic oral rinse  7 mL Mouth Rinse BID  . aspirin EC  81 mg Oral Daily  . atorvastatin  20 mg Oral q1800  . azithromycin  500 mg Intravenous Q24H  . carvedilol  3.125 mg Oral BID WC  . cefTRIAXone (ROCEPHIN)  IV  1 g Intravenous Q24H  . enoxaparin (LOVENOX) injection  30 mg Subcutaneous Q24H  . hydrALAZINE  10 mg Oral 3 times per day  . ipratropium-albuterol  3 mL Nebulization QID  . methylPREDNISolone (SOLU-MEDROL) injection  40 mg Intravenous Q24H  . sodium chloride  3 mL Intravenous Q12H   Continuous Infusions: . sodium chloride 10 mL/hr at 05/18/14 0700   PRN Meds:.acetaminophen, acetaminophen, HYDROcodone-acetaminophen, ipratropium-albuterol, morphine injection, ondansetron (ZOFRAN) IV, ondansetron  Physical exam Blood pressure 104/66, pulse 90, temperature 98.7 F (37.1 C), temperature source Oral, resp. rate 25, height 6' (1.829 m), weight 136 lb 14.5 oz (62.1 kg), SpO2 98.00%. General: NAD Neck: No JVD, no thyromegaly or thyroid nodule.  Lungs: Crackles/bronchial breath sounds right base.  CV: Nondisplaced PMI.  Heart regular S1/S2, no S3/S4, no murmur.  No peripheral edema.  No carotid bruit.   Abdomen: Soft,  nontender, no hepatosplenomegaly, no distention.  Skin: Intact without lesions or rashes.  Neurologic: Alert and oriented x 3.  Psych: Normal affect. Extremities: No clubbing or cyanosis.  HEENT: Normal.   Labs:   Lab Results  Component Value Date   WBC 6.2 05/18/2014   HGB 10.8* 05/18/2014   HCT 32.2* 05/18/2014   MCV 81.5 05/18/2014   PLT 249 05/18/2014    Recent Labs Lab 05/18/14 0400  NA 144  K 3.5*  CL 107  CO2 22  BUN 27*  CREATININE 1.72*  CALCIUM 9.4  PROT 7.0  BILITOT 0.2*  ALKPHOS 118*  ALT 22  AST 17  GLUCOSE 112*   Lab Results  Component Value Date   TROPONINI <0.30 05/17/2014  BNP 10,162    Radiology: - CXR: Multifocal PNA  EKG: NSR with PVCs and wide LBBB  ASSESSMENT AND PLAN: 80 yo with history of chronic systolic CHF and probable ischemic cardiomyopathy as well as COPD and CKD was admitted with dyspnea and hypoxia.  He has been treated for PNA, CHF exacerbation and COPD exacerbation. 1. Community-acquired PNA: I think this is probably the main issue.  CXR and exam are consistent though he has not been febrile.  - Antibiotics per primary service.  2. COPD exacerbation: He is being treated with nebs and steroids.  No wheezing today.  3. CHF: Acute/chronic systolic CHF, EF 93%.  He has received IV Lasix boluses with some diuresis since admission.  Creatinine is up to 1.7.  He does not appear volume overloaded currently.  I think his CHF is compensated at this point.   - Hold ACEI for now with creatinine up.  Will start Bidil 1/2 tab tid.  Can add back low dose lisinopril eventually.  - Would substitute bisoprolol for Coreg given COPD (more beta-1 selective).  - No Lasix for now, may need standing po Lasix going home, will follow.  - Has wide QRS, LBBB.  Given age, not ICD candidate but could consider CRT for symptom control.  Loralie Champagne 05/18/2014

## 2014-05-18 NOTE — Progress Notes (Signed)
INITIAL NUTRITION ASSESSMENT  Pt meets criteria for severe MALNUTRITION in the context of chronic illness as evidenced by 11.7% weight loss in the past 5 months with <75% estimated energy intake in the past month per pt report.  DOCUMENTATION CODES Per approved criteria  -Severe malnutrition in the context of chronic illness   INTERVENTION: - Ensure Complete TID - Encouraged increased meal intake - RD to continue to monitor   NUTRITION DIAGNOSIS: Increased nutrient needs related to COPD and unintended weight loss as evidenced by MD notes, weight trend.   Goal: Pt to consume >90% of meals/supplements  Monitor:  Weights, labs, intake   Reason for Assessment: Consult for assessment, malnutrition screening tool   80 y.o. male  Admitting Dx: Acute respiratory failure with hypoxia  ASSESSMENT: Pt with HTN, HLD, prostate cancer but on no active treatment, CKD stage II - III, COPD, severe combined systolic and diastolic CHF (EF 29%), presented to [facility] ED with main concern of progressively worsening shortness of breath, worse with exertion and somewhat better with resting but no other alleviating factors.   - Pt reports on/off appetite and intake - Sometimes eats 1 small meal/day, other times 2-3 meals/day - Drinks Ensure on/off  - Said he's lost 35 pounds unintentionally in the past 4 years - Ate 100% of his breakfast this morning  - Pt reports he uses a lot of salt on his foods at home - discouraged this as pt with CHF and briefly discussed salt-free ways to season his foods   Alk phos elevated Potassium slightly low, getting oral replacement  Nutrition Focused Physical Exam:  Subcutaneous Fat:  Orbital Region: mild wasting Upper Arm Region: mild wasting Thoracic and Lumbar Region: n/a  Muscle:  Temple Region: mild wasting Clavicle Bone Region: mild wasting Clavicle and Acromion Bone Region: mild wasting Scapular Bone Region: n/a Dorsal Hand: mild wasting Patellar Region:  severe wasting Anterior Thigh Region: severe wasting Posterior Calf Region: severe wasting  Edema: None observed    Height: Ht Readings from Last 1 Encounters:  05/17/14 6' (1.829 m)    Weight: Wt Readings from Last 1 Encounters:  05/18/14 136 lb 14.5 oz (62.1 kg)    Ideal Body Weight: 178 lbs   % Ideal Body Weight: 76%  Wt Readings from Last 10 Encounters:  05/18/14 136 lb 14.5 oz (62.1 kg)  01/21/14 144 lb 6.4 oz (65.499 kg)  12/28/13 154 lb (69.854 kg)  12/06/13 161 lb (73.029 kg)  04/28/13 164 lb 12.8 oz (74.753 kg)  12/09/12 164 lb (74.39 kg)  04/22/12 165 lb (74.844 kg)    Usual Body Weight: 171 lbs 4 years ago per pt  % Usual Body Weight: 79%  BMI:  Body mass index is 18.56 kg/(m^2).  Estimated Nutritional Needs: Kcal: 2200-2400 Protein: 100-120g Fluid: 2.2-2.4L/day   Skin: intact   Diet Order: General  EDUCATION NEEDS: -No education needs identified at this time   Intake/Output Summary (Last 24 hours) at 05/18/14 1206 Last data filed at 05/18/14 1100  Gross per 24 hour  Intake    760 ml  Output   1625 ml  Net   -865 ml    Last BM: PTA  Labs:   Recent Labs Lab 05/17/14 0902 05/17/14 1315 05/18/14 0400  NA 140 139 144  K 3.7 3.6* 3.5*  CL 103 100 107  CO2 $Re'21 25 22  'jCi$ BUN 17 17 27*  CREATININE 1.49* 1.36* 1.72*  CALCIUM 9.5 9.7 9.4  MG  --  1.5  --  PHOS  --  2.8  --   GLUCOSE 134* 105* 112*    CBG (last 3)   Recent Labs  05/18/14 0748  GLUCAP 93    Scheduled Meds: . allopurinol  300 mg Oral Daily  . antiseptic oral rinse  7 mL Mouth Rinse BID  . aspirin EC  81 mg Oral Daily  . atorvastatin  20 mg Oral q1800  . azithromycin  500 mg Intravenous Q24H  . bisoprolol  2.5 mg Oral Daily  . cefTRIAXone (ROCEPHIN)  IV  1 g Intravenous Q24H  . enoxaparin (LOVENOX) injection  30 mg Subcutaneous Q24H  . ipratropium-albuterol  3 mL Nebulization QID  . isosorbide-hydrALAZINE  0.5 tablet Oral TID  . methylPREDNISolone  (SOLU-MEDROL) injection  40 mg Intravenous Q24H  . sodium chloride  3 mL Intravenous Q12H    Continuous Infusions:   Past Medical History  Diagnosis Date  . Hyperlipidemia   . Hypertension   . CHF (congestive heart failure)   . COPD (chronic obstructive pulmonary disease)   . Cancer     prostate  . Postsurgical percutaneous transluminal coronary angioplasty (PTCA) status   . S/P coronary artery stent placement     Past Surgical History  Procedure Laterality Date  . Radioactive seed implant      Carlis Stable MS, RD, LDN [phone] Pager [phone] Weekend/After Hours Pager

## 2014-05-18 NOTE — Progress Notes (Signed)
CARE MANAGEMENT NOTE 05/18/2014  Patient:  Edward Nunez, Edward Nunez   Account Number:  [id number]  Date Initiated:  05/18/2014  Documentation initiated by:  DAVIS,RHONDA  Subjective/Objective Assessment:   pt with hx of copd now with pna, o2 in ed at 81% on room air, placed on o2 at 4l/min     Action/Plan:   home -pt does [facility] with [facility] about increased wob and copd.   Anticipated DC Date:  05/21/2014   Anticipated DC Plan:  HOME/SELF CARE  In-house referral  NA      DC Planning Services  NA      [facility] Choice  NA   Choice offered to / List presented to:  NA   DME arranged  NA      DME agency  NA     [facility] arranged  NA      [place] agency  NA   Status of service:  In process, will continue to follow Medicare Important Message given?  NA - LOS <3 / Initial given by admissions (If response is "NO", the following Medicare IM given date fields will be blank) Date Medicare IM given:   Medicare IM given by:   Date Additional Medicare IM given:   Additional Medicare IM given by:    Discharge Disposition:    Per UR Regulation:  Reviewed for med. necessity/level of care/duration of stay  If discussed at [place] of Stay Meetings, dates discussed:    Comments:  [id number]/Rhonda Eldridge Dace, BSN, CCM: [phone] Chart reviewed for discharge needs.  Patient used [facility] for med.needs.Has an upcoming apointment. Next chart review due on [id number].

## 2014-05-18 NOTE — Progress Notes (Signed)
CSW received referral for COPD protocol.  Per chart review, pt has only had one admission in the past 6 months.  Inappropriate CSW referral.   CSW signing off.   Please re-consult if social work needs arise.  Alison Murray, MSW, [place] Work [phone]

## 2014-05-19 DIAGNOSIS — I447 Left bundle-branch block, unspecified: Secondary | ICD-10-CM

## 2014-05-19 DIAGNOSIS — N189 Chronic kidney disease, unspecified: Secondary | ICD-10-CM

## 2014-05-19 DIAGNOSIS — J449 Chronic obstructive pulmonary disease, unspecified: Secondary | ICD-10-CM

## 2014-05-19 DIAGNOSIS — J4489 Other specified chronic obstructive pulmonary disease: Secondary | ICD-10-CM

## 2014-05-19 DIAGNOSIS — I5043 Acute on chronic combined systolic (congestive) and diastolic (congestive) heart failure: Principal | ICD-10-CM

## 2014-05-19 DIAGNOSIS — I5023 Acute on chronic systolic (congestive) heart failure: Secondary | ICD-10-CM

## 2014-05-19 LAB — BASIC METABOLIC PANEL
ANION GAP: 12 (ref 5–15)
BUN: 31 mg/dL — ABNORMAL HIGH (ref 6–23)
CO2: 25 meq/L (ref 19–32)
Calcium: 9.8 mg/dL (ref 8.4–10.5)
Chloride: 104 mEq/L (ref 96–112)
Creatinine, Ser: 1.43 mg/dL — ABNORMAL HIGH (ref 0.50–1.35)
GFR calc non Af Amer: 43 mL/min — ABNORMAL LOW (ref >90)
GFR, EST AFRICAN AMERICAN: 50 mL/min — AB (ref >90)
Glucose, Bld: 93 mg/dL (ref 70–99)
Potassium: 3.9 mEq/L (ref 3.7–5.3)
SODIUM: 141 meq/L (ref 137–147)

## 2014-05-19 LAB — CBC
HCT: 31.3 % — ABNORMAL LOW (ref 39.0–52.0)
Hemoglobin: 10.8 g/dL — ABNORMAL LOW (ref 13.0–17.0)
MCH: 28.1 pg (ref 26.0–34.0)
MCHC: 34.5 g/dL (ref 30.0–36.0)
MCV: 81.5 fL (ref 78.0–100.0)
PLATELETS: 298 10*3/uL (ref 150–400)
RBC: 3.84 MIL/uL — ABNORMAL LOW (ref 4.22–5.81)
RDW: 14.8 % (ref 11.5–15.5)
WBC: 7.8 10*3/uL (ref 4.0–10.5)

## 2014-05-19 LAB — GLUCOSE, CAPILLARY: GLUCOSE-CAPILLARY: 98 mg/dL (ref 70–99)

## 2014-05-19 MED ORDER — PREDNISONE 50 MG PO TABS
50.0000 mg | ORAL_TABLET | Freq: Every day | ORAL | Status: DC
  Administered 2014-05-19 – 2014-05-22 (×4): 50 mg via ORAL
  Filled 2014-05-19 (×2): qty 1
  Filled 2014-05-19: qty 3
  Filled 2014-05-19 (×2): qty 1

## 2014-05-19 MED ORDER — ZOLPIDEM TARTRATE 5 MG PO TABS
5.0000 mg | ORAL_TABLET | Freq: Every day | ORAL | Status: DC
  Administered 2014-05-19 – 2014-05-25 (×5): 5 mg via ORAL
  Filled 2014-05-19 (×5): qty 1

## 2014-05-19 MED ORDER — ENOXAPARIN SODIUM 40 MG/0.4ML ~~LOC~~ SOLN
40.0000 mg | SUBCUTANEOUS | Status: DC
  Administered 2014-05-19 – 2014-05-26 (×8): 40 mg via SUBCUTANEOUS
  Filled 2014-05-19 (×8): qty 0.4

## 2014-05-19 NOTE — Progress Notes (Signed)
Agree with previous RN assessment. Will continue to monitor pt.

## 2014-05-19 NOTE — Progress Notes (Signed)
Patient Profile: 80 yo with history of chronic systolic CHF and probable ischemic cardiomyopathy as well as COPD and CKD was admitted with dyspnea and hypoxia.    Subjective:  No chest pain; breathing better  Objective:   Vital Signs in the last 24 hours: Temp:  [97.1 F (36.2 C)-98.7 F (37.1 C)] 97.1 F (36.2 C) (08/11 0400) Pulse Rate:  [42-95] 65 (08/11 0400) Resp:  [16-29] 16 (08/11 0400) BP: (102-137)/(48-92) 133/70 mmHg (08/11 0400) SpO2:  [94 %-99 %] 94 % (08/11 0400) Weight:  [138 lb 3.7 oz (62.7 kg)] 138 lb 3.7 oz (62.7 kg) (08/11 0400)  Intake/Output from previous day: 08/10 0701 - 08/11 0700 In: 820 [P.O.:480; I.V.:40; IV Piggyback:300] Out: 875 [Urine:875]  I/O since admission: -1250  Medications: . allopurinol  300 mg Oral Daily  . antiseptic oral rinse  7 mL Mouth Rinse BID  . aspirin EC  81 mg Oral Daily  . atorvastatin  20 mg Oral q1800  . azithromycin  500 mg Intravenous Q24H  . bisoprolol  2.5 mg Oral Daily  . cefTRIAXone (ROCEPHIN)  IV  1 g Intravenous Q24H  . enoxaparin (LOVENOX) injection  30 mg Subcutaneous Q24H  . feeding supplement (ENSURE COMPLETE)  237 mL Oral TID BM  . ipratropium-albuterol  3 mL Nebulization QID  . isosorbide-hydrALAZINE  0.5 tablet Oral TID  . methylPREDNISolone (SOLU-MEDROL) injection  40 mg Intravenous Q24H  . sodium chloride  3 mL Intravenous Q12H  . zolpidem  5 mg Oral QHS       Physical Exam:   General appearance: alert, cooperative and no distress Neck: no adenopathy, no carotid bruit, no JVD, supple, symmetrical, trachea midline and thyroid not enlarged, symmetric, no tenderness/mass/nodules Lungs: Decreased BS R>L; no wheezing Heart: regular rate and rhythm and 1/0 sem Abdomen: soft, non-tender; bowel sounds normal; no masses,  no organomegaly Extremities: no edema, redness or tenderness in the calves or thighs Neurologic: Grossly normal   Rate: 75  Rhythm: sinus with PAC's  Lab  Results:   Recent Labs  05/18/14 0400 05/19/14 0348  NA 144 141  K 3.5* 3.9  CL 107 104  CO2 22 25  GLUCOSE 112* 93  BUN 27* 31*  CREATININE 1.72* 1.43*   CBC Latest Ref Rng 05/19/2014 05/18/2014 05/17/2014  WBC 4.0 - 10.5 K/uL 7.8 6.2 5.8  Hemoglobin 13.0 - 17.0 g/dL 10.8(L) 10.8(L) 12.6(L)  Hematocrit 39.0 - 52.0 % 31.3(L) 32.2(L) 35.9(L)  Platelets 150 - 400 K/uL 298 249 271     Recent Labs  05/17/14 0902  TROPONINI <0.30    Hepatic Function Panel  Recent Labs  05/18/14 0400  PROT 7.0  ALBUMIN 2.5*  AST 17  ALT 22  ALKPHOS 118*  BILITOT 0.2*    Recent Labs  05/17/14 1315  INR 1.06   BNP (last 3 results)  Recent Labs  05/17/14 0902  PROBNP 10162.0*    Lipid Panel     Component Value Date/Time   CHOL 290* 12/28/2013 0921   TRIG 120 12/28/2013 0921   HDL 56 12/28/2013 0921   CHOLHDL 5.2 12/28/2013 0921   VLDL 24 12/28/2013 0921   LDLCALC 210* 12/28/2013 0921      Imaging:  Dg Chest 2 View  05/17/2014   CLINICAL DATA:  Shortness of breath and COPD.  EXAM: CHEST  2 VIEW  COMPARISON:  12/06/2013  FINDINGS: Two views of the chest demonstrate hyperinflation and emphysema. There are increased patchy parenchymal densities throughout the right  lung. Again noted is a more confluent density near the right lung apex. Increased patchy densities at the left lung base. Difficult to exclude small pleural effusions. Heart size is grossly stable.  IMPRESSION: Patchy parenchymal disease throughout the right lung and probably involving the left lower lung. Findings are suggestive for multi-focal pneumonia. Probable small effusions.  Persistent density near the right lung apex is nonspecific. Recommend follow-up chest radiographs to ensure resolution.   Electronically Signed   By: Markus Daft M.D.   On: 05/17/2014 09:44      Assessment/Plan:   Principal Problem:   Acute respiratory failure with hypoxia Active Problems:   HTN (hypertension)   Hyperlipidemia   COPD  (chronic obstructive pulmonary disease)   Community acquired pneumonia   Acute renal failure   Systolic and diastolic CHF, acute on chronic   CKD (chronic kidney disease) stage 4, GFR 15-29 ml/min   Protein-calorie malnutrition, severe  1. Community-acquired PNA: Patchy parenchymal disease throughout the right lung and probably involving the left lower lung. Findings are suggestive for multi-focal pneumonia.- Antibiotics per primary service.  2. COPD exacerbation: He is being treated with nebs and steroids. No wheezing today.  3. CHF: Acute/chronic systolic CHF, EF 73%.  Bidil started yesterday at 1/2 tid; will keep at present dose today and titrate to 1 tid tomorrow. Tolerating switch to bisoprolol for Coreg; BNP yesterday 10,162 Will give oral lasix at 40 mg today. will f/u BNP tomorrow - Has wide QRS, LBBB. Given age, not ICD candidate but could consider CRT for symptom control.  4. Renal insufficiency: Cr better today at 1.43; continue to hold  ACE-I       Troy Sine, MD, [facility] 05/19/2014, 7:11 AM

## 2014-05-19 NOTE — Evaluation (Signed)
Occupational Therapy Evaluation Patient Details Name: Edward Nunez MRN: [id number] DOB: November 26, 1925 Today's Date: 05/19/2014    History of Present Illness 80 yo with dyspnea and hypoxia, pna ; PMHx: chronic systolic CHF and probable ischemic cardiomyopathy, COPD, CKD, EF 20%   Clinical Impression   Pt was admitted for the above.  At baseline, he is independent with all basic ADLs and drives.  He is currently min guard overall for balance.  Pt would benefit from skilled OT to increase safety and independence with adls with supervision level goals in acute.      Follow Up Recommendations  No OT follow up    Equipment Recommendations  None recommended by OT    Recommendations for Other Services       Precautions / Restrictions Precautions Precautions: Fall Restrictions Weight Bearing Restrictions: No      Mobility Bed Mobility   Bed Mobility: Supine to Sit     Supine to sit: Supervision        Transfers Overall transfer level: Needs assistance Equipment used: Straight cane Transfers: Sit to/from Stand Sit to Stand: Min guard         General transfer comment: close guard for safety    Balance Overall balance assessment: Needs assistance         Standing balance support: Single extremity supported;During functional activity Standing balance-Leahy Scale: Fair                              ADL Overall ADL's : Needs assistance/impaired             Lower Body Bathing: Min guard;Sit to/from stand       Lower Body Dressing: Min guard;Sit to/from stand   Toilet Transfer: Min guard;Ambulation;Regular Museum/gallery exhibitions officer and Hygiene: Min guard;Sit to/from stand   Tub/ Shower Transfer: Tub transfer;Minimal assistance     General ADL Comments: Pt needs min guard for standing activities for balance.  He was very independent prior to admission and he reports that he did get SOB and took rest breaks as well as paced  himself.  Educated on Hotel manager      Pertinent Vitals/Pain Pain Assessment: No/denies pain     Hand Dominance     Extremity/Trunk Assessment Upper Extremity Assessment Upper Extremity Assessment: Overall WFL for tasks assessed           Communication Communication Communication: No difficulties   Cognition Arousal/Alertness: Awake/alert Behavior During Therapy: WFL for tasks assessed/performed Overall Cognitive Status: Within Functional Limits for tasks assessed                     General Comments       Exercises       Shoulder Instructions      Home Living Family/patient expects to be discharged to:: Private residence Living Arrangements: Spouse/significant other;Children Available Help at Discharge: Family               Bathroom Shower/Tub: Tub/shower unit Shower/tub characteristics: Architectural technologist: Standard     Home Equipment: Civil engineer, contracting          Prior Functioning/Environment Level of Independence: Independent             OT Diagnosis: Generalized weakness  OT Problem List: Decreased strength;Decreased activity tolerance;Impaired balance (sitting and/or standing);Cardiopulmonary status limiting activity   OT Treatment/Interventions: Self-care/ADL training;Balance training;Patient/family education    OT Goals(Current goals can be found in the care plan section) Acute Rehab OT Goals Patient Stated Goal: get back to being independent/get strength back OT Goal Formulation: With patient Time For Goal Achievement: 06/02/14 Potential to Achieve Goals: Good ADL Goals Pt Will Transfer to Toilet: with supervision;ambulating;regular height toilet Pt Will Perform Toileting - Clothing Manipulation and hygiene: with modified independence;sit to/from stand Additional ADL Goal #1: pt will gather clothes at supervision level and complete adl without  supervision, sit to stand  OT Frequency: Min 2X/week   Barriers to D/C:            Co-evaluation              End of Session    Activity Tolerance: Patient tolerated treatment well Patient left: in chair;with call bell/phone within reach;with chair alarm set   Time: [id number] OT Time Calculation (min): 17 min Charges:  OT General Charges $OT Visit: 1 Procedure OT Evaluation $Initial OT Evaluation Tier I: 1 Procedure OT Treatments $Self Care/Home Management : 8-22 mins G-Codes:    Avonell Lenig 2014/06/17, 4:51 PM  Lesle Chris, OTR/L [phone] June 17, 2014

## 2014-05-19 NOTE — Progress Notes (Signed)
Patient ID: Edward Nunez, male   DOB: [birth year], 80 y.o.   MRN: [id number] [facility] PROGRESS NOTE  Edgar Reisz MGQ:[id number] DOB: Aug 06, 1926 DOA: 05/17/2014 PCP: [name], MD  Brief narrative: Pt is 80 yo male with HTN, HLD, prostate cancer but on no active treatment, CKD stage II - III, COPD, severe combined systolic and diastolic CHF (EF 26%), presented to [facility] ED with main concern of progressively worsening shortness of breath, worse with exertion and somewhat better with resting but no other alleviating factors. Pt denied fevers, chills, chest pain.  In ED, pt noted to be hemodynamically stable but hypoxic with oxygen saturations in mid 80's or RA (please note he is not on oxygen at home). BMP notable for Cr 1.49. TRH asked to admit to SDU for further evaluation and management.   Assessment & Plan   Principal Problem:  Acute respiratory failure with hypoxia  Likely combination of community acquired pneumonia, acute COPD exacerbation and acute on chronic diastolic CHF  Respiratory status remains stable. Continue azithromycin and rocephin for treatment of pneumonia. Blood cultures, resp culture pending. Legionella and strep pneumonia results negative.  Continue duoneb every 6 hours scheduled and every 2 hours PRN shortness of breath or wheezing for COPD exacerbation. We will switch from IV steroids to PO steroids today.  Given 2 boluses of IV lasix since admission on admission. Cardio consulted and appreciate their recommendations. Oxygen support via [place] to keep O2 saturation above 90%  Active Problems:  Tachy-brady / irregular hear rate   Rate controlled this am. Per cardio, started Zebeta 2.5 mg PO daily and Bidil TID COPD (chronic obstructive pulmonary disease)  Continue duoneb every 6 hours scheduled and every 2 hours PRN. Continue solumedrol 40 mg IV daily for next 24 hours.  Continue abx for pneumonia, azithromycin and rocephin.  Oxygen support via [place] to keep O2  saturation above 90%  COPD gold alert order placed  Community acquired pneumonia  Continue azithromycin and rocephin  Blood cultures, resp culture pending.  Systolic and diastolic CHF, acute on chronic  BNP elevated in 10,000 range which may be also due to renal insufficiency.  Pt has received 2 boluses of lasix since admission. Per cardio, lasix not recommended at this time. Pt not in volume overload. Started zebeta and bidil Replace electrolytes as needed  HTN (hypertension)  Hold fosinopril and hctz  BP on admission on lower side so held antihypertensives. Will give 1 dose of lasix (has gotten 40 mg IV on admission)  Appreciate cardio input on BP meds.  Hyperlipidemia  Continue statin therapy  Chronic kidney disease, stage IV  Has had elevated creatinine 1.5 in 2013. This admission creatinine lower than baseline value  Severe protein calorie malnutrition  In the context of chronic illness  Nutrition consulted  DVT Prophylaxis   Lovenox subQ while pt is in hospital    Code Status: Full.  Family Communication: plan of care discussed with the patient  Disposition Plan: transfer to telemetry; order placed..    IV Access:   Peripheral IV Procedures and diagnostic studies:   Dg Chest 2 View 05/17/2014 IMPRESSION: Patchy parenchymal disease throughout the right lung and probably involving the left lower lung. Findings are suggestive for multi-focal pneumonia. Probable small effusions. Persistent density near the right lung apex is nonspecific. Recommend follow-up chest radiographs to ensure resolution.  Medical Consultants:   Cardiology  Other Consultants:   Nutrition Physical therapy  Anti-Infectives:   Azithromycin 05/17/2014 -->  Rocephin 05/17/2014 -->  [name], MD  [facility] Pager [phone]  If 7PM-7AM, please contact night-coverage [email or web address] Password [email or web address] 05/19/2014, 7:06 AM   LOS: 2 days    HPI/Subjective: No acute overnight  events.  Objective: Filed Vitals:   05/18/14 2200 05/18/14 2330 05/19/14 0000 05/19/14 0400  BP: 102/48  113/78 133/70  Pulse: 83  81 65  Temp:   97.9 F (36.6 C) 97.1 F (36.2 C)  TempSrc:   Oral Oral  Resp: 24  26 16   Height:      Weight:    62.7 kg (138 lb 3.7 oz)  SpO2: 97% 98% 99% 94%    Intake/Output Summary (Last 24 hours) at 05/19/14 0706 Last data filed at 05/19/14 0400  Gross per 24 hour  Intake    820 ml  Output    875 ml  Net    -55 ml    Exam:   General:  Pt is alert, follows commands appropriately, not in acute distress  Cardiovascular: irregular rhythm, rate controlled, S1/S2 appreciated   Respiratory: basilar crackles, no wheezing   Abdomen: Soft, non tender, non distended, bowel sounds present  Extremities: No edema, pulses DP and PT palpable bilaterally  Neuro: Grossly nonfocal  Data Reviewed: Basic Metabolic Panel:  Recent Labs Lab 05/17/14 0902 05/17/14 1315 05/18/14 0400 05/19/14 0348  NA 140 139 144 141  K 3.7 3.6* 3.5* 3.9  CL 103 100 107 104  CO2 21 25 22 25   GLUCOSE 134* 105* 112* 93  BUN 17 17 27* 31*  CREATININE 1.49* 1.36* 1.72* 1.43*  CALCIUM 9.5 9.7 9.4 9.8  MG  --  1.5  --   --   PHOS  --  2.8  --   --    Liver Function Tests:  Recent Labs Lab 05/17/14 0902 05/17/14 1315 05/18/14 0400  AST 58* 43* 17  ALT 38 37 22  ALKPHOS 141* 155* 118*  BILITOT 0.4 0.4 0.2*  PROT 7.3 8.3 7.0  ALBUMIN 2.4* 2.9* 2.5*   No results found for this basename: LIPASE, AMYLASE,  in the last 168 hours No results found for this basename: AMMONIA,  in the last 168 hours CBC:  Recent Labs Lab 05/17/14 0902 05/17/14 1315 05/18/14 0400 05/19/14 0348  WBC 7.4 5.8 6.2 7.8  NEUTROABS 6.0 5.0  --   --   HGB 10.6* 12.6* 10.8* 10.8*  HCT 31.9* 35.9* 32.2* 31.3*  MCV 83.7 81.4 81.5 81.5  PLT 268 271 249 298   Cardiac Enzymes:  Recent Labs Lab 05/17/14 0902  TROPONINI <0.30   BNP: No components found with this basename:  POCBNP,  CBG:  Recent Labs Lab 05/18/14 0748  GLUCAP 93    URINE CULTURE     Status: None   Collection Time    05/17/14 12:17 PM      Result Value Ref Range Status   Specimen Description URINE, CLEAN CATCH   Final   Value: NO GROWTH     Performed at [facility]   Report Status 05/18/2014 FINAL   Final  MRSA PCR SCREENING     Status: None   Collection Time    05/17/14 12:45 PM      Result Value Ref Range Status   MRSA by PCR NEGATIVE  NEGATIVE Final  CULTURE, RESPIRATORY (NON-EXPECTORATED)     Status: None   Collection Time    05/17/14  3:30 PM      Result Value Ref Range Status   Specimen Description SPUTUM  Final   Special Requests NONE   Final   Gram Stain     Final   Value: RARE WBC PRESENT, PREDOMINANTLY PMN     FEW SQUAMOUS EPITHELIAL CELLS PRESENT     FEW GRAM POSITIVE COCCI     IN PAIRS IN CLUSTERS RARE GRAM POSITIVE RODS     RARE GRAM NEGATIVE RODS   Culture     Final   Value: NO GROWTH     Performed at [facility]   Report Status PENDING   Incomplete     Scheduled Meds: . allopurinol  300 mg Oral Daily  . aspirin EC  81 mg Oral Daily  . atorvastatin  20 mg Oral q1800  . azithromycin  500 mg Intravenous Q24H  . bisoprolol  2.5 mg Oral Daily  . cefTRIAXone   1 g Intravenous Q24H  . enoxaparin (LOVENOX)   30 mg Subcutaneous Q24H  . feeding supplement   237 mL Oral TID BM  . ipratropium-albuterol  3 mL Nebulization QID  . isosorbide-hydrALAZINE  0.5 tablet Oral TID  . methylPREDNISolone   40 mg Intravenous Q24H  . zolpidem  5 mg Oral QHS

## 2014-05-19 NOTE — Progress Notes (Signed)
Physical Therapy Treatment Patient Details Name: Edward Nunez MRN: [id number] DOB: 03/20/1926 Today's Date: 05/19/2014    History of Present Illness 80 yo with dyspnea and hypoxia, pna ; PMHx: chronic systolic CHF and probable ischemic cardiomyopathy, COPD, CKD, EF 20%    PT Comments    Progressing with mobility. Still unsteady at times. Ambulated on RA-95%. Dyspnea 2/4  Follow Up Recommendations  Home health PT     Equipment Recommendations  Cane    Recommendations for Other Services       Precautions / Restrictions Precautions Precautions: Fall Restrictions Weight Bearing Restrictions: No    Mobility  Bed Mobility   Bed Mobility: Supine to Sit     Supine to sit: Supervision        Transfers Overall transfer level: Needs assistance Equipment used: Straight cane Transfers: Sit to/from Stand Sit to Stand: Min guard         General transfer comment: close guard for safety  Ambulation/Gait Ambulation/Gait assistance: Min assist Ambulation Distance (Feet): 140 Feet Assistive device: Straight cane Gait Pattern/deviations: Step-through pattern;Decreased stride length     General Gait Details: intermittent unsteadiness requiring small amount of assistance to correct. Dyspnea 2/4. O2 sats 95% on RA   Stairs            Wheelchair Mobility    Modified Rankin (Stroke Patients Only)       Balance Overall balance assessment: Needs assistance         Standing balance support: Single extremity supported;During functional activity Standing balance-Leahy Scale: Fair                      Cognition Arousal/Alertness: Awake/alert Behavior During Therapy: WFL for tasks assessed/performed Overall Cognitive Status: Within Functional Limits for tasks assessed                      Exercises      General Comments        Pertinent Vitals/Pain Pain Assessment: No/denies pain    Home Living                      Prior  Function            PT Goals (current goals can now be found in the care plan section) Progress towards PT goals: Progressing toward goals    Frequency  Min 3X/week    PT Plan Current plan remains appropriate    Co-evaluation             End of Session Equipment Utilized During Treatment: Gait belt Activity Tolerance: Patient limited by fatigue Patient left: in chair;with call bell/phone within reach (with OT)     Time: 1551-1601 PT Time Calculation (min): 10 min  Charges:  $Gait Training: 8-22 mins                    G Codes:      Weston Anna, MPT Pager: [phone]

## 2014-05-20 ENCOUNTER — Inpatient Hospital Stay (HOSPITAL_COMMUNITY): Payer: Medicare Other

## 2014-05-20 DIAGNOSIS — E785 Hyperlipidemia, unspecified: Secondary | ICD-10-CM

## 2014-05-20 LAB — CULTURE, RESPIRATORY: CULTURE: NORMAL

## 2014-05-20 LAB — CULTURE, RESPIRATORY W GRAM STAIN

## 2014-05-20 LAB — CBC
HCT: 31.8 % — ABNORMAL LOW (ref 39.0–52.0)
Hemoglobin: 10.8 g/dL — ABNORMAL LOW (ref 13.0–17.0)
MCH: 27.6 pg (ref 26.0–34.0)
MCHC: 34 g/dL (ref 30.0–36.0)
MCV: 81.3 fL (ref 78.0–100.0)
PLATELETS: 310 10*3/uL (ref 150–400)
RBC: 3.91 MIL/uL — AB (ref 4.22–5.81)
RDW: 15 % (ref 11.5–15.5)
WBC: 8.4 10*3/uL (ref 4.0–10.5)

## 2014-05-20 LAB — GLUCOSE, CAPILLARY: GLUCOSE-CAPILLARY: 103 mg/dL — AB (ref 70–99)

## 2014-05-20 LAB — BASIC METABOLIC PANEL
Anion gap: 12 (ref 5–15)
BUN: 28 mg/dL — ABNORMAL HIGH (ref 6–23)
CALCIUM: 10.1 mg/dL (ref 8.4–10.5)
CO2: 26 mEq/L (ref 19–32)
Chloride: 106 mEq/L (ref 96–112)
Creatinine, Ser: 1.24 mg/dL (ref 0.50–1.35)
GFR, EST AFRICAN AMERICAN: 59 mL/min — AB (ref >90)
GFR, EST NON AFRICAN AMERICAN: 51 mL/min — AB (ref >90)
Glucose, Bld: 113 mg/dL — ABNORMAL HIGH (ref 70–99)
POTASSIUM: 4.1 meq/L (ref 3.7–5.3)
Sodium: 144 mEq/L (ref 137–147)

## 2014-05-20 MED ORDER — ISOSORB DINITRATE-HYDRALAZINE 20-37.5 MG PO TABS
1.0000 | ORAL_TABLET | Freq: Three times a day (TID) | ORAL | Status: DC
  Administered 2014-05-20 – 2014-05-26 (×18): 1 via ORAL
  Filled 2014-05-20 (×21): qty 1

## 2014-05-20 NOTE — Care Management Note (Addendum)
    Page 1 of 2   05/26/2014     3:49:41 PM CARE MANAGEMENT NOTE 05/26/2014  Patient:  Edward Nunez, Edward Nunez   Account Number:  [id number]  Date Initiated:  05/18/2014  Documentation initiated by:  DAVIS,RHONDA  Subjective/Objective Assessment:   pt with hx of copd now with pna, o2 in ed at 81% on room air, placed on o2 at 4l/min     Action/Plan:   home -pt does have an appt with the [facility] about increased wob and copd.   Anticipated DC Date:  05/26/2014   Anticipated DC Plan:  [place]  In-house referral  NA      DC Planning Services  CM consult      PAC Choice  NA   Choice offered to / List presented to:  C-1 Patient   DME arranged  [place]      DME agency  [place] arranged  [address]   Status of service:  Completed, signed off Medicare Important Message given?  NA - LOS <3 / Initial given by admissions (If response is "NO", the following Medicare IM given date fields will be blank) Date Medicare IM given:  05/20/2014 Medicare IM given by:  Dessa Phi Date Additional Medicare IM given:  05/26/2014 Additional Medicare IM given by:  Leafy Kindle  Discharge Disposition:  [place]  Per UR Regulation:  Reviewed for med. necessity/level of care/duration of stay  If discussed at [place] of Stay Meetings, dates discussed:   05/21/2014  05/26/2014    Comments:  05/26/14 Zareya Tuckett RN,BSN NCM [phone] [facility] DME REP [place] AWARE OF RW ORDER.WILL BRING TO RM.  05/26/14 Marney Doctor Rn, BSN NCM [facility] home [facility]) confirmed start of care tomorrow 05/27/14 for [facility] PT.  Faxed orders with confirmation.  Home O2 sats qualify, home O2 order written.  [place] DME rep Pura Spice aware of order and DC today.  No further DC needs.   05/21/14 Oswald Pott RN,BSN NCM [address]) TEL#[phone],THEY CAN START SERVICES ON 05/25/14 EVEN HE HE  DISCHARGES 8/14-8/16,CALL THEM WITH UPDATES.THEY DO NEED HHC ORDERS FAXED TO [phone].PATIENT QUALIFIES FOR HOME 02.TC [facility] DME REP [place] AWARE OF 02 SATS.AWAIT FINAL HHC/DME ORDERS.PATIENT VOICED UNDERSTANDING.  05/20/14 Shilynn Hoch RN,BSN  NCM [place].PT-HH.[facility]-SPKE TO KAREN @ [phone]/FAXED W/CONFIRMATION [phone]-FACE SHEET,H&P.TENTATIVE SOC DATE 05/25/14.AWAIT HHPT/OT ORDER.  [id number]/WNIOEV Rosana Hoes, RN, BSN, CCM: [phone] Chart reviewed for discharge needs.  Patient used [facility] for med.needs.Has an upcoming apointment. Next chart review due on [id number].

## 2014-05-20 NOTE — Progress Notes (Signed)
Patient ID: Edward Nunez, male   DOB: 1926/05/02, 80 y.o.   MRN: [id number] [facility] PROGRESS NOTE  [name] PRF:[id number] DOB: 03-24-1926 DOA: 05/17/2014 PCP: [name], MD  Brief narrative: Pt is 79 yo male with HTN, HLD, prostate cancer but on no active treatment, CKD stage II - III, COPD, severe combined systolic and diastolic CHF (EF 93%), presented to [facility] ED with main concern of progressively worsening shortness of breath, worse with exertion and somewhat better with resting but no other alleviating factors. Pt denied fevers, chills, chest pain.  In ED, pt noted to be hemodynamically stable but hypoxic with oxygen saturations in mid 80's or RA (please note he is not on oxygen at home). BMP notable for Cr 1.49. TRH asked to admit to SDU for further evaluation and management.   Assessment & Plan   Principal Problem:  Acute respiratory failure with hypoxia  Likely combination of community acquired pneumonia, acute COPD exacerbation and acute on chronic diastolic CHF  Respiratory status remains stable. Continue azithromycin and rocephin for treatment of pneumonia. Blood cultures, resp culture pending. Legionella and strep pneumonia results negative.  Continue duoneb every 6 hours scheduled and every 2 hours PRN shortness of breath or wheezing for COPD exacerbation. We will switch from IV steroids to PO steroids today.  Given 2 boluses of IV lasix since admission on admission. Cardio consulted and appreciate their recommendations. Oxygen support via [place] to keep O2 saturation above 90%  Active Problems:  Tachy-brady / irregular hear rate   Rate controlled this am. Per cardio, started Zebeta 2.5 mg PO daily and Bidil TID COPD (chronic obstructive pulmonary disease)  Continue duoneb every 6 hours scheduled and every 2 hours PRN. Continue solumedrol 40 mg IV daily for next 24 hours.  Continue abx for pneumonia, azithromycin and rocephin.  Oxygen support via [place] to keep O2  saturation above 90%  COPD gold alert order placed  Community acquired pneumonia  Continue azithromycin and rocephin  Blood cultures, resp culture pending.  Systolic and diastolic CHF, acute on chronic  BNP elevated in 10,000 range which may be also due to renal insufficiency.  Pt has received 2 boluses of lasix since admission. Per cardio, lasix not recommended at this time. Pt not in volume overload. Started zebeta and bidil Replace electrolytes as needed  HTN (hypertension)  Hold fosinopril and hctz  BP on admission on lower side so held antihypertensives. Will give 1 dose of lasix (has gotten 40 mg IV on admission)  Appreciate cardio input on BP meds.  Hyperlipidemia  Continue statin therapy  Chronic kidney disease, stage IV  Has had elevated creatinine 1.5 in 2013. This admission creatinine lower than baseline value  Severe protein calorie malnutrition  In the context of chronic illness  Nutrition consulted  DVT Prophylaxis   Lovenox subQ while pt is in hospital    Code Status: Full.  Family Communication: plan of care discussed with the patient  Disposition Plan: transfer to telemetry; order placed..    IV Access:   Peripheral IV Procedures and diagnostic studies:   Dg Chest 2 View 05/17/2014 IMPRESSION: Patchy parenchymal disease throughout the right lung and probably involving the left lower lung. Findings are suggestive for multi-focal pneumonia. Probable small effusions. Persistent density near the right lung apex is nonspecific. Recommend follow-up chest radiographs to ensure resolution.  Medical Consultants:   Cardiology  Other Consultants:   Nutrition Physical therapy  Anti-Infectives:   Azithromycin 05/17/2014 -->  Rocephin 05/17/2014 -->  Oswald Hillock, MD  [facility] Pager [phone]  If 7PM-7AM, please contact night-coverage [email or web address] Password [facility] 05/20/2014, 6:32 PM   LOS: 3 days    HPI/Subjective: Patient seen and examined, denies new  complaints.  Objective: Filed Vitals:   05/20/14 0935 05/20/14 1240 05/20/14 1350 05/20/14 1629  BP:   125/69   Pulse:   84   Temp:   97.4 F (36.3 C)   TempSrc:   Oral   Resp:   18   Height:      Weight:      SpO2: 94% 95% 99% 96%    Intake/Output Summary (Last 24 hours) at 05/20/14 1832 Last data filed at 05/20/14 1350  Gross per 24 hour  Intake      0 ml  Output    325 ml  Net   -325 ml    Exam:  Physical Exam: Head: Normocephalic, atraumatic.  Neck: supple,No deformities, masses, or tenderness noted. Lungs: Normal respiratory effort. B/L Clear to auscultation, no crackles or wheezes.  Heart: Regular RR. S1 and S2 normal  Abdomen: BS normoactive. Soft, Nondistended, non-tender.  Extremities: No pretibial edema, no erythema   Data Reviewed: Basic Metabolic Panel:  Recent Labs Lab 05/17/14 0902 05/17/14 1315 05/18/14 0400 05/19/14 0348 05/20/14 0444  NA 140 139 144 141 144  K 3.7 3.6* 3.5* 3.9 4.1  CL 103 100 107 104 106  CO2 21 25 22 25 26   GLUCOSE 134* 105* 112* 93 113*  BUN 17 17 27* 31* 28*  CREATININE 1.49* 1.36* 1.72* 1.43* 1.24  CALCIUM 9.5 9.7 9.4 9.8 10.1  MG  --  1.5  --   --   --   PHOS  --  2.8  --   --   --    Liver Function Tests:  Recent Labs Lab 05/17/14 0902 05/17/14 1315 05/18/14 0400  AST 58* 43* 17  ALT 38 37 22  ALKPHOS 141* 155* 118*  BILITOT 0.4 0.4 0.2*  PROT 7.3 8.3 7.0  ALBUMIN 2.4* 2.9* 2.5*   No results found for this basename: LIPASE, AMYLASE,  in the last 168 hours No results found for this basename: AMMONIA,  in the last 168 hours CBC:  Recent Labs Lab 05/17/14 0902 05/17/14 1315 05/18/14 0400 05/19/14 0348 05/20/14 0444  WBC 7.4 5.8 6.2 7.8 8.4  NEUTROABS 6.0 5.0  --   --   --   HGB 10.6* 12.6* 10.8* 10.8* 10.8*  HCT 31.9* 35.9* 32.2* 31.3* 31.8*  MCV 83.7 81.4 81.5 81.5 81.3  PLT 268 271 249 298 310   Cardiac Enzymes:  Recent Labs Lab 05/17/14 0902  TROPONINI <0.30   BNP: No components  found with this basename: POCBNP,  CBG:  Recent Labs Lab 05/18/14 0748 05/19/14 0752 05/20/14 0740  GLUCAP 93 98 103*    URINE CULTURE     Status: None   Collection Time    05/17/14 12:17 PM      Result Value Ref Range Status   Specimen Description URINE, CLEAN CATCH   Final   Value: NO GROWTH     Performed at [facility]   Report Status 05/18/2014 FINAL   Final  MRSA PCR SCREENING     Status: None   Collection Time    05/17/14 12:45 PM      Result Value Ref Range Status   MRSA by PCR NEGATIVE  NEGATIVE Final  CULTURE, RESPIRATORY (NON-EXPECTORATED)     Status: None   Collection Time  05/17/14  3:30 PM      Result Value Ref Range Status   Specimen Description SPUTUM   Final   Special Requests NONE   Final   Gram Stain     Final   Value: RARE WBC PRESENT, PREDOMINANTLY PMN     FEW SQUAMOUS EPITHELIAL CELLS PRESENT     FEW GRAM POSITIVE COCCI     IN PAIRS IN CLUSTERS RARE GRAM POSITIVE RODS     RARE GRAM NEGATIVE RODS   Culture     Final   Value: NO GROWTH     Performed at [facility]   Report Status PENDING   Incomplete     Scheduled Meds: . allopurinol  300 mg Oral Daily  . aspirin EC  81 mg Oral Daily  . atorvastatin  20 mg Oral q1800  . azithromycin  500 mg Intravenous Q24H  . bisoprolol  2.5 mg Oral Daily  . cefTRIAXone   1 g Intravenous Q24H  . enoxaparin (LOVENOX)   30 mg Subcutaneous Q24H  . feeding supplement   237 mL Oral TID BM  . ipratropium-albuterol  3 mL Nebulization QID  . isosorbide-hydrALAZINE  0.5 tablet Oral TID  . methylPREDNISolone   40 mg Intravenous Q24H  . zolpidem  5 mg Oral QHS

## 2014-05-20 NOTE — Progress Notes (Addendum)
[facility]   Patient Profile: 80 yo with history of chronic systolic CHF and probable ischemic cardiomyopathy as well as COPD and CKD was admitted with dyspnea and hypoxia.    Subjective:  Occasional coughing and shortness of breath with activity  Objective:   Vital Signs in the last 24 hours: Temp:  [97.5 F (36.4 C)-98 F (36.7 C)] 98 F (36.7 C) (08/12 0433) Pulse Rate:  [79-98] 79 (08/12 0433) Resp:  [18-25] 18 (08/12 0446) BP: (129-148)/(66-81) 131/81 mmHg (08/12 0433) SpO2:  [92 %-98 %] 95 % (08/12 0627) Weight:  [143 lb 4.8 oz (65 kg)] 143 lb 4.8 oz (65 kg) (08/12 0450)  Intake/Output from previous day: 08/11 0701 - 08/12 0700 In: 71 [P.O.:120; IV Piggyback:300] Out: 575 [Urine:575]  I/O since admission: -1405  Medications: . allopurinol  300 mg Oral Daily  . antiseptic oral rinse  7 mL Mouth Rinse BID  . aspirin EC  81 mg Oral Daily  . atorvastatin  20 mg Oral q1800  . azithromycin  500 mg Intravenous Q24H  . bisoprolol  2.5 mg Oral Daily  . cefTRIAXone (ROCEPHIN)  IV  1 g Intravenous Q24H  . enoxaparin (LOVENOX) injection  40 mg Subcutaneous Q24H  . feeding supplement (ENSURE COMPLETE)  237 mL Oral TID BM  . ipratropium-albuterol  3 mL Nebulization QID  . isosorbide-hydrALAZINE  0.5 tablet Oral TID  . predniSONE  50 mg Oral Q breakfast  . sodium chloride  3 mL Intravenous Q12H  . zolpidem  5 mg Oral QHS       Physical Exam:   General appearance: alert, cooperative and no distress Neck: no adenopathy, no carotid bruit, no JVD, supple, symmetrical, trachea midline and thyroid not enlarged, symmetric, no tenderness/mass/nodules Lungs: Decreased BS R>L; no wheezing Heart: regular rate and rhythm and 1/0 sem Abdomen: soft, non-tender; bowel sounds normal; no masses,  no organomegaly Extremities: no edema, redness or tenderness in the calves or thighs Neurologic: Grossly normal   Rate: 90  Rhythm: sinus with PAC's  Lab Results:   Recent Labs   05/19/14 0348 05/20/14 0444  NA 141 144  K 3.9 4.1  CL 104 106  CO2 25 26  GLUCOSE 93 113*  BUN 31* 28*  CREATININE 1.43* 1.24   CBC Latest Ref Rng 05/20/2014 05/19/2014 05/18/2014  WBC 4.0 - 10.5 K/uL 8.4 7.8 6.2  Hemoglobin 13.0 - 17.0 g/dL 10.8(L) 10.8(L) 10.8(L)  Hematocrit 39.0 - 52.0 % 31.8(L) 31.3(L) 32.2(L)  Platelets 150 - 400 K/uL 310 298 249     Recent Labs  05/17/14 0902  TROPONINI <0.30    Hepatic Function Panel  Recent Labs  05/18/14 0400  PROT 7.0  ALBUMIN 2.5*  AST 17  ALT 22  ALKPHOS 118*  BILITOT 0.2*    Recent Labs  05/17/14 1315  INR 1.06   BNP (last 3 results)  Recent Labs  05/17/14 0902  PROBNP 10162.0*    Lipid Panel     Component Value Date/Time   CHOL 290* 12/28/2013 0921   TRIG 120 12/28/2013 0921   HDL 56 12/28/2013 0921   CHOLHDL 5.2 12/28/2013 0921   VLDL 24 12/28/2013 0921   LDLCALC 210* 12/28/2013 0921      Imaging:  No results found.    Assessment/Plan:   Principal Problem:   Acute respiratory failure with hypoxia Active Problems:   HTN (hypertension)   Hyperlipidemia   COPD (chronic obstructive pulmonary disease)   Community acquired pneumonia   Acute renal  failure   Systolic and diastolic CHF, acute on chronic   CKD (chronic kidney disease) stage 4, GFR 15-29 ml/min   Protein-calorie malnutrition, severe  1. Community-acquired PNA: Patchy parenchymal disease throughout the right lung and probably involving the left lower lung. Findings are suggestive for multi-focal pneumonia.- Antibiotics per primary service.  2. COPD exacerbation: He is being treated with nebs and steroids. No wheezing today.  3. CHF: Acute/chronic systolic CHF, EF 75%.  Will titrate bidil to 1 tid today.  Tolerating switch to bisoprolol for [place]; follow HR rate response, may be able to titrate to 5 mg but would not due presently.; BNP on 8/9 10,162  Continue oral lasix.  - Has wide QRS, LBBB. Given age, not ICD candidate but could  consider CRT for symptom control.  4. Renal insufficiency: Cr better today at 1.24; continue to hold  ACE-I       Troy Sine, MD, [facility] 05/20/2014, 8:45 AM

## 2014-05-20 NOTE — Progress Notes (Signed)
Physical Therapy Treatment Patient Details Name: Edward Nunez MRN: [id number] DOB: 1926-09-16 Today's Date: 05/20/2014    History of Present Illness 80 yo with dyspnea and hypoxia, pna ; PMHx: chronic systolic CHF and probable ischemic cardiomyopathy, COPD, CKD, EF 20%    PT Comments      Follow Up Recommendations  Home health PT     Equipment Recommendations  Cane    Recommendations for Other Services       Precautions / Restrictions Precautions Precautions: Fall Restrictions Weight Bearing Restrictions: No    Mobility  Bed Mobility         Supine to sit: Modified independent (Device/Increase time)        Transfers     Transfers: Sit to/from Stand Sit to Stand: Supervision            Ambulation/Gait Ambulation/Gait assistance: Min guard Ambulation Distance (Feet): 100 Feet Assistive device: None (used handrail intermittently) Gait Pattern/deviations: Step-through pattern     General Gait Details: slow gait speed. 1 seated rest break needed. Ambulated on 2L O2 at pt's request but also due to O2 sats being only 91% 2L at rest.    Stairs            Wheelchair Mobility    Modified Rankin (Stroke Patients Only)       Balance                                    Cognition Arousal/Alertness: Awake/alert Behavior During Therapy: WFL for tasks assessed/performed Overall Cognitive Status: Within Functional Limits for tasks assessed                      Exercises      General Comments        Pertinent Vitals/Pain Pain Assessment: No/denies pain    Home Living                      Prior Function            PT Goals (current goals can now be found in the care plan section) Progress towards PT goals: Progressing toward goals    Frequency  Min 3X/week    PT Plan Current plan remains appropriate    Co-evaluation             End of Session Equipment Utilized During Treatment: Gait  belt;Oxygen Activity Tolerance: Patient limited by fatigue Patient left: in bed;with call bell/phone within reach     Time: 1047-1108 PT Time Calculation (min): 21 min  Charges:  $Gait Training: 8-22 mins                    G Codes:      Weston Anna, MPT Pager: [phone]

## 2014-05-20 NOTE — Progress Notes (Signed)
Occupational Therapy Treatment Patient Details Name: Edward Nunez MRN: [id number] DOB: [birth year] Today's Date: 05/20/2014    History of present illness 80 yo with dyspnea and hypoxia, pna ; PMHx: chronic systolic CHF and probable ischemic cardiomyopathy, COPD, CKD, EF 20%   OT comments  Pt didn't feel as well today.  Performed adl seated level with concentration on energy conservation and pursed lip breathing.  Pt is COPD gold with EF of 20%  Follow Up Recommendations  Home health OT    Equipment Recommendations  None recommended by OT (likely)    Recommendations for Other Services      Precautions / Restrictions Precautions Precautions: Fall Restrictions Weight Bearing Restrictions: No       Mobility Bed Mobility         Supine to sit: Supervision        Transfers                      Balance                                   ADL Overall ADL's : Needs assistance/impaired                                       General ADL Comments: pt reports fatique this am.  Wanted to wash from eob--did not stand for peri area--wished to do this later.  Overall set up without standing;  Sats 100% without activity on 2 liters.  Dropped to 91% during adl.  Educated on pursed lip breathing and encouraged rest breaks for energy conservation.  Dyspnea 2/4      Vision                     Perception     Praxis      Cognition   Behavior During Therapy: WFL for tasks assessed/performed Overall Cognitive Status: Within Functional Limits for tasks assessed                       Extremity/Trunk Assessment               Exercises     Shoulder Instructions       General Comments      Pertinent Vitals/ Pain       Pain Assessment: No/denies pain  Home Living                                          Prior Functioning/Environment              Frequency       Progress Toward  Goals  OT Goals(current goals can now be found in the care plan section)  Progress towards OT goals: Progressing toward goals (slowly)     Plan Discharge plan needs to be updated    Co-evaluation                 End of Session     Activity Tolerance Patient limited by fatigue   Patient Left in bed;with call bell/phone within reach;with bed alarm set   Nurse Communication          Time: 2010-0712 OT Time Calculation (  min): 19 min  Charges: OT General Charges $OT Visit: 1 Procedure OT Treatments $Self Care/Home Management : 8-22 mins  Brynlea Spindler 05/20/2014, 9:44 AM  Lesle Chris, OTR/L [phone] 05/20/2014

## 2014-05-21 ENCOUNTER — Inpatient Hospital Stay (HOSPITAL_COMMUNITY): Payer: Medicare Other

## 2014-05-21 DIAGNOSIS — R918 Other nonspecific abnormal finding of lung field: Secondary | ICD-10-CM

## 2014-05-21 LAB — BASIC METABOLIC PANEL
Anion gap: 10 (ref 5–15)
BUN: 27 mg/dL — AB (ref 6–23)
CALCIUM: 9.8 mg/dL (ref 8.4–10.5)
CHLORIDE: 110 meq/L (ref 96–112)
CO2: 27 mEq/L (ref 19–32)
CREATININE: 1.21 mg/dL (ref 0.50–1.35)
GFR calc non Af Amer: 53 mL/min — ABNORMAL LOW (ref >90)
GFR, EST AFRICAN AMERICAN: 61 mL/min — AB (ref >90)
Glucose, Bld: 98 mg/dL (ref 70–99)
Potassium: 4 mEq/L (ref 3.7–5.3)
Sodium: 147 mEq/L (ref 137–147)

## 2014-05-21 LAB — CBC
HEMATOCRIT: 29.2 % — AB (ref 39.0–52.0)
HEMOGLOBIN: 9.9 g/dL — AB (ref 13.0–17.0)
MCH: 27.7 pg (ref 26.0–34.0)
MCHC: 33.9 g/dL (ref 30.0–36.0)
MCV: 81.8 fL (ref 78.0–100.0)
Platelets: 277 10*3/uL (ref 150–400)
RBC: 3.57 MIL/uL — ABNORMAL LOW (ref 4.22–5.81)
RDW: 15 % (ref 11.5–15.5)
WBC: 7.2 10*3/uL (ref 4.0–10.5)

## 2014-05-21 LAB — GLUCOSE, CAPILLARY: GLUCOSE-CAPILLARY: 92 mg/dL (ref 70–99)

## 2014-05-21 MED ORDER — ALBUTEROL SULFATE (2.5 MG/3ML) 0.083% IN NEBU
2.5000 mg | INHALATION_SOLUTION | RESPIRATORY_TRACT | Status: DC | PRN
Start: 2014-05-21 — End: 2014-05-26

## 2014-05-21 MED ORDER — BISOPROLOL FUMARATE 5 MG PO TABS
5.0000 mg | ORAL_TABLET | Freq: Every day | ORAL | Status: DC
  Administered 2014-05-21 – 2014-05-26 (×6): 5 mg via ORAL
  Filled 2014-05-21 (×6): qty 1

## 2014-05-21 NOTE — Progress Notes (Signed)
SATURATION QUALIFICATIONS: (This note is used to comply with regulatory documentation for home oxygen)  Patient Saturations on Room Air at Rest = 93**%  Patient Saturations on Room Air while Ambulating = *85*%  Patient Saturations on *4* Liters of oxygen while Ambulating = 95**%  Please briefly explain why patient needs home oxygen: to maintain appropriate SaO2 levels during activity.   Blondell Reveal Kistler PT 05/21/2014  [phone]

## 2014-05-21 NOTE — Progress Notes (Signed)
Patient Profile: 80 yo with history of chronic systolic CHF and probable ischemic cardiomyopathy as well as COPD and CKD was admitted with dyspnea and hypoxia.    Subjective:  shortness of breath with activity  Objective:   Vital Signs in the last 24 hours: Temp:  [97.4 F (36.3 C)-97.8 F (36.6 C)] 97.8 F (36.6 C) (08/13 0456) Pulse Rate:  [81-86] 86 (08/13 0456) Resp:  [18-20] 20 (08/13 0456) BP: (110-129)/(51-69) 129/66 mmHg (08/13 0456) SpO2:  [94 %-99 %] 98 % (08/13 0821) Weight:  [141 lb 5 oz (64.1 kg)] 141 lb 5 oz (64.1 kg) (08/13 0539)  Intake/Output from previous day: -340 08/12 0701 - 08/13 0700 In: 60 [P.O.:60] Out: 400 [Urine:400]  I/O since admission: -1945  Medications: . allopurinol  300 mg Oral Daily  . antiseptic oral rinse  7 mL Mouth Rinse BID  . aspirin EC  81 mg Oral Daily  . atorvastatin  20 mg Oral q1800  . azithromycin  500 mg Intravenous Q24H  . bisoprolol  2.5 mg Oral Daily  . cefTRIAXone (ROCEPHIN)  IV  1 g Intravenous Q24H  . enoxaparin (LOVENOX) injection  40 mg Subcutaneous Q24H  . feeding supplement (ENSURE COMPLETE)  237 mL Oral TID BM  . ipratropium-albuterol  3 mL Nebulization QID  . isosorbide-hydrALAZINE  1 tablet Oral TID  . predniSONE  50 mg Oral Q breakfast  . sodium chloride  3 mL Intravenous Q12H  . zolpidem  5 mg Oral QHS       Physical Exam:   General appearance: alert, cooperative and no distress Neck: no adenopathy, no carotid bruit, no JVD, supple, symmetrical, trachea midline and thyroid not enlarged, symmetric, no tenderness/mass/nodules Lungs: Decreased BS R>L; no wheezing Heart: regular rate and rhythm and 1/0 sem Abdomen: soft, non-tender; bowel sounds normal; no masses,  no organomegaly Extremities: no edema, redness or tenderness in the calves or thighs Neurologic: Grossly normal   Rate: 88-90  Rhythm: Sinus  Lab Results:   Recent Labs  05/20/14 0444 05/21/14 0446  NA 144 147  K 4.1 4.0  CL  106 110  CO2 26 27  GLUCOSE 113* 98  BUN 28* 27*  CREATININE 1.24 1.21   CBC Latest Ref Rng 05/21/2014 05/20/2014 05/19/2014  WBC 4.0 - 10.5 K/uL 7.2 8.4 7.8  Hemoglobin 13.0 - 17.0 g/dL 9.9(L) 10.8(L) 10.8(L)  Hematocrit 39.0 - 52.0 % 29.2(L) 31.8(L) 31.3(L)  Platelets 150 - 400 K/uL 277 310 298    No results found for this basename: TROPONINI, CK, MB,  in the last 72 hours  Hepatic Function Panel No results found for this basename: PROT, ALBUMIN, AST, ALT, ALKPHOS, BILITOT, BILIDIR, IBILI,  in the last 72 hours No results found for this basename: INR,  in the last 72 hours BNP (last 3 results)  Recent Labs  05/17/14 0902  PROBNP 10162.0*    Lipid Panel     Component Value Date/Time   CHOL 290* 12/28/2013 0921   TRIG 120 12/28/2013 0921   HDL 56 12/28/2013 0921   CHOLHDL 5.2 12/28/2013 0921   VLDL 24 12/28/2013 0921   LDLCALC 210* 12/28/2013 0921      Imaging:  Dg Chest 2 View  05/20/2014   CLINICAL DATA:  Weakness is shortness of breath, possible pneumonia  EXAM: CHEST  2 VIEW  COMPARISON:  05/17/2014  FINDINGS: Heavy diffuse interstitial infiltrate throughout the right lung, minimally improved. More subtle interstitial infiltrate left lower lobe stable. More focal consolidative  ovoid 2 cm right apical opacity stable. There is a small right pleural effusion.  IMPRESSION: Findings again consistent with mild left and severe right airspace disease suggesting multifocal pneumonia. There has been minimal improvement on the right.  Again seen is a more masslike opacity in the right apex. Followup to document resolution is recommended. This opacity is new from 2010 and also represents a change from 2014. Right apical mass is not excluded.   Electronically Signed   By: Skipper Cliche M.D.   On: 05/20/2014 13:37      Assessment/Plan:   Principal Problem:   Acute respiratory failure with hypoxia Active Problems:   HTN (hypertension)   Hyperlipidemia   COPD (chronic obstructive  pulmonary disease)   Community acquired pneumonia   Acute renal failure   Systolic and diastolic CHF, acute on chronic   CKD (chronic kidney disease) stage 4, GFR 15-29 ml/min   Protein-calorie malnutrition, severe  1. Community-acquired PNA: Patchy parenchymal disease throughout the right lung and probably involving the left lower lung. Findings are suggestive for multi-focal pneumonia. F/U  CXR with mild left and severe right airspace disease suggesting multifocal pneumonia. There has been minimal improvement on the right.  Again seen is a more masslike opacity in the right apex. Antibiotics per primary service.  2. COPD exacerbation: He is being treated with nebs and steroids. No wheezing today.  3. CHF: Acute/chronic systolic CHF, EF 05%.  Will titrate bidil to 1 tid today.  Tolerating switch to bisoprolol for Coreg; will titrate to 5 mg today. Not on ACE-I with recent renal insuff. - Has wide QRS, LBBB. Given age, not ICD candidate but could consider CRT for symptom control.  4. Renal insufficiency: Cr better today at 1.21;      Troy Sine, MD, [facility] 05/21/2014, 8:29 AM

## 2014-05-21 NOTE — Consult Note (Signed)
Name: Edward Nunez MRN: [id number] DOB: Jan 31, 1926    ADMISSION DATE:  05/17/2014 CONSULTATION DATE:  05/21/14  REFERRING MD :  Dr. Darrick Meigs  PRIMARY SERVICE:  TRH   CHIEF COMPLAINT:  Abnormal CXR, concern for mass  BRIEF PATIENT DESCRIPTION: 80 y/o M admitted 8/9 with progressively worsening SOB & hypoxemia.  Patient admitted  SIGNIFICANT EVENTS: 8/09  Admit with concerns for CAP + CHF exacerbation  8/12  PCCM consulted for RUL lung mass  STUDIES:  8/09  ECHO >> mod LVH, EF 20%, severe global hypokinesis, diastolic dysfunction, mod AS,  trivial pericardial effusion    CULTURES: Sputum 8/9 >> nml flora UC 8/10 >>neg   ANTIBIOTICS: Per Primary SVC  HISTORY OF PRESENT ILLNESS:  80 y/o M, former smoker, with a PMH of HTN, HLD, CHF, CAD s/p PTCA with stent placement, COPD and prostate cancer s/p radioactive seed implants who presented to [facility] on 05/17/14 via EMS with  complaints of progressive shortness of breath.  He was recently seen by the [facility] and given an inhaler.  He was scheduled for a lung biopsy by the [facility] on 8/10 but did not make the appointment due to current admission.    Patients initial evaluation noted saturations in the low 80's.  Initial CXR demonstrated hyperinflation, emphysema, & patchy R densities throughout the lung with more prominent consolidation near the R apex.  He was admitted by [facility] and treated for CAP & CHF exacerbation.  ECHO was completed which was notable for an EF of 20%, global hypokinesis, moderate AS and diastolic dysfunction.  He was evaluated by Cardiology and felt to be compensated by the time of evaluation on 8/10 after diuresis.  The patient made slow improvement and follow up CXR was assessed on 8/12 which raised concerns for RUL mass.  PCCM consulted 8/13 for pulmonary evaluation.    At baseline, he continues to be active.  He drives his daughter to work on a daily basis.  He reports mild dyspnea over the past year that has  worsened over the last 6 months but specifically increased over the week prior to admit.  He is usually able to climb stairs without difficulty but is slow and has been unable to do so without rest periods as of late.      PAST MEDICAL HISTORY :  Past Medical History  Diagnosis Date  . Hyperlipidemia   . Hypertension   . CHF (congestive heart failure)   . COPD (chronic obstructive pulmonary disease)   . Cancer     prostate  . Postsurgical percutaneous transluminal coronary angioplasty (PTCA) status   . S/P coronary artery stent placement    Past Surgical History  Procedure Laterality Date  . Radioactive seed implant     Prior to Admission medications   Medication Sig Start Date End Date Taking? Authorizing Provider  albuterol-ipratropium (COMBIVENT) 18-103 MCG/ACT inhaler Inhale 1 puff into the lungs every 4 (four) hours.   Yes Historical Provider, MD  allopurinol (ZYLOPRIM) 300 MG tablet Take 300 mg by mouth daily.   Yes Historical Provider, MD  aspirin 81 MG tablet Take 81 mg by mouth daily.   Yes Historical Provider, MD  atorvastatin (LIPITOR) 20 MG tablet Take 20 mg by mouth daily.   Yes Historical Provider, MD  carvedilol (COREG) 3.125 MG tablet Take 3.125 mg by mouth 2 (two) times daily with a meal.   Yes Historical Provider, MD  fosinopril (MONOPRIL) 40 MG tablet Take 40 mg  by mouth 2 (two) times daily.   Yes Historical Provider, MD  hydrochlorothiazide (HYDRODIURIL) 25 MG tablet Take 25 mg by mouth daily.   Yes Historical Provider, MD   No Known Allergies  FAMILY HISTORY:  History reviewed. No pertinent family history.  SOCIAL HISTORY:  reports that he quit smoking about 7 years ago. He does not have any smokeless tobacco history on file. He reports that he does not drink alcohol or use illicit drugs.  REVIEW OF SYSTEMS:   Constitutional: Negative for fever, chills, malaise/fatigue and diaphoresis. Pt reports 45 lb weight loss over the last 1 year. HENT: Negative for  hearing loss, ear pain, nosebleeds, congestion, sore throat, neck pain, tinnitus and ear discharge.   Eyes: Negative for blurred vision, double vision, photophobia, pain, discharge and redness.  Respiratory: Negative for hemoptysis, wheezing and stridor.  Pt reports cough with clear sputum production, shortness of breath (see HPI) Cardiovascular: Negative for chest pain, palpitations, orthopnea, claudication, leg swelling and PND.  Gastrointestinal: Negative for heartburn, nausea, vomiting, abdominal pain, diarrhea, constipation, blood in stool and melena.  Genitourinary: Negative for dysuria, urgency, frequency, hematuria and flank pain.  Musculoskeletal: Negative for myalgias, back pain, joint pain and falls.  Skin: Negative for itching and rash.  Neurological: Negative for dizziness, tingling, tremors, sensory change, speech change, focal weakness, seizures, loss of consciousness, weakness and headaches.  Endo/Heme/Allergies: Negative for environmental allergies and polydipsia. Does not bruise/bleed easily.  SUBJECTIVE:   VITAL SIGNS: Temp:  [97.4 F (36.3 C)-97.8 F (36.6 C)] 97.8 F (36.6 C) (08/13 0456) Pulse Rate:  [81-86] 86 (08/13 0456) Resp:  [20] 20 (08/13 0456) BP: (110-129)/(51-66) 129/66 mmHg (08/13 0456) SpO2:  [85 %-98 %] 85 % (08/13 1343) Weight:  [141 lb 5 oz (64.1 kg)] 141 lb 5 oz (64.1 kg) (08/13 0539)  PHYSICAL EXAMINATION: General:  Thin adult male in NAD3 Neuro:  AAOx4, speech clear, MAE  HEENT:  Mm pink/moist, no jvd, good dentition  Cardiovascular:  s1s2 rrr, no m/r/g, distant tones, no edema Lungs:  resp's even/non-labored, lungs bilaterally diminished but clear Abdomen:  Flat, soft, bsx4 active  Musculoskeletal:  No acute deformities  Skin:  Warm/dry, no lesions / rashes   Recent Labs Lab 05/19/14 0348 05/20/14 0444 05/21/14 0446  NA 141 144 147  K 3.9 4.1 4.0  CL 104 106 110  CO2 25 26 27   BUN 31* 28* 27*  CREATININE 1.43* 1.24 1.21  GLUCOSE  93 113* 98    Recent Labs Lab 05/19/14 0348 05/20/14 0444 05/21/14 0446  HGB 10.8* 10.8* 9.9*  HCT 31.3* 31.8* 29.2*  WBC 7.8 8.4 7.2  PLT 298 310 277   Dg Chest 2 View  05/20/2014   CLINICAL DATA:  Weakness is shortness of breath, possible pneumonia  EXAM: CHEST  2 VIEW  COMPARISON:  05/17/2014  FINDINGS: Heavy diffuse interstitial infiltrate throughout the right lung, minimally improved. More subtle interstitial infiltrate left lower lobe stable. More focal consolidative ovoid 2 cm right apical opacity stable. There is a small right pleural effusion.  IMPRESSION: Findings again consistent with mild left and severe right airspace disease suggesting multifocal pneumonia. There has been minimal improvement on the right.  Again seen is a more masslike opacity in the right apex. Followup to document resolution is recommended. This opacity is new from 2010 and also represents a change from 2014. Right apical mass is not excluded.   Electronically Signed   By: Skipper Cliche M.D.   On: 05/20/2014 13:37  ASSESSMENT / PLAN:  RUL Airspace Disease - concerning for mass.  Patient has had 3 CT scans at the [facility] in the last year.  He was set up for a bronchoscopy for 8/10 but did not make the appt due to current admission.   Acute Hypoxic Respiratory Failure COPD CAP   Plan:  -Oxygen to support saturations of 90-95% -Pulmonary hygiene -Complete 7 days abx for CAP, ok to consider narrowing to oral medications -Keep euvolemic -Recommend PFT assessment once current acute illness resolved -Consider d/c on a LABA / ICS and Q3 PRN albuterol for SOB / Wheezing.  He was on scheduled combivent prior to admit, ? If cost was an issue with his medications.  -Obtain CT & progress note records from the [place] further imaging until these can be reviewed -Likely, he will be able to return to the [place] to complete his work up for the RUL mass like opacity  Protein Calorie Malnutrition    Plan: Ensure TID  Noe Gens, NP-C [facility] Pgr: [phone] or [phone]    05/21/2014, 2:28 PM   STAFF NOTE: I, Dr Ann Lions have personally reviewed patient's available data, including medical history, events of note, physical examination and test results as part of my evaluation. I have discussed with resident/NP and other care providers such as pharmacist, RN and RRT.  In addition,  I personally evaluated patient and elicited key findings of  - Rt lung mass concern on cXR: in talking to him I confirm that he likely has had scans at [facility] and there was bronch planned for 05/18/14. Therefore is best to avoid XRT exposure if current stay is not going to prolonged that he stabilize and return to [facility]. A CT now can be confusing with CHF picture on board unless one wants it for ddx despite good diuresis. He is too sick for safe bronch now given this admission for CHF. We have requested records from [facility] to confirm his history: PCCM will follow along but this can be done by [facility] team as well.   Rest per NP/medical resident whose note is outlined above and that I agree with    Dr. Brand Males, M.D., [facility].C.P Pulmonary and Critical Care Medicine Staff Physician [facility] Pager: [phone], If no answer or between  15:00h - 7:00h: call [phone]  05/21/2014 9:24 PM

## 2014-05-21 NOTE — Progress Notes (Addendum)
Physical Therapy Treatment Patient Details Name: Edward Nunez MRN: [id number] DOB: April 05, 1926 Today's Date: 05/21/2014    History of Present Illness 80 yo with dyspnea and hypoxia, pna ; PMHx: chronic systolic CHF and probable ischemic cardiomyopathy, COPD, CKD, EF 20%    PT Comments    *Pt ambulated 120' holding IV pole with 4L O2, he had 1 seated rest break due to SOB. SaO2 85% on RA walking, 88% on 2L O2, 95% on 4L O2 walking. **  Follow Up Recommendations  Home health PT     Equipment Recommendations  Cane, Home O2   Recommendations for Other Services       Precautions / Restrictions Precautions Precautions: Fall Precaution Comments: monitor O2 Restrictions Weight Bearing Restrictions: No    Mobility  Bed Mobility Overal bed mobility: Modified Independent Bed Mobility: Supine to Sit     Supine to sit: Modified independent (Device/Increase time)        Transfers Overall transfer level: Needs assistance Equipment used: Straight cane Transfers: Sit to/from Stand Sit to Stand: Supervision         General transfer comment: close guard for safety  Ambulation/Gait   Ambulation Distance (Feet): 120 Feet (80' + 40' with 4 minute seated rest break) Assistive device:  (pt held onto IV pole with LUE) Gait Pattern/deviations: WFL(Within Functional Limits)     General Gait Details: slow gait speed. 1 seated rest break needed 2* SOB, SaO2 85% on RA walking, 88% on 2L, 95% on 4L O2   Stairs            Wheelchair Mobility    Modified Rankin (Stroke Patients Only)       Balance           Standing balance support: Single extremity supported;During functional activity Standing balance-Leahy Scale: Fair                      Cognition Arousal/Alertness: Awake/alert Behavior During Therapy: WFL for tasks assessed/performed Overall Cognitive Status: Within Functional Limits for tasks assessed                      Exercises       General Comments        Pertinent Vitals/Pain Pain Assessment: No/denies pain    Home Living                      Prior Function            PT Goals (current goals can now be found in the care plan section) Acute Rehab PT Goals Patient Stated Goal: get back to being independent/get strength back PT Goal Formulation: With patient Time For Goal Achievement: 05/25/14 Potential to Achieve Goals: Good Progress towards PT goals: Progressing toward goals    Frequency  Min 3X/week    PT Plan Current plan remains appropriate    Co-evaluation             End of Session Equipment Utilized During Treatment: Gait belt;Oxygen Activity Tolerance: Patient limited by fatigue Patient left: with call bell/phone within reach;in chair;with chair alarm set     Time: 1321-1345 PT Time Calculation (min): 24 min  Charges:  $Gait Training: 8-22 mins $Therapeutic Activity: 8-22 mins                    G Codes:      [name] 05/21/2014, 1:49 PM [phone]

## 2014-05-21 NOTE — Progress Notes (Signed)
Patient ID: Ariez Neilan, male   DOB: 1926-09-23, 80 y.o.   MRN: [id number] [facility] PROGRESS NOTE  Jahmarion Popoff EPP:[id number] DOB: 1926-03-10 DOA: 05/17/2014 PCP: [name], MD  Brief narrative: Pt is 80 yo male with HTN, HLD, prostate cancer but on no active treatment, CKD stage II - III, COPD, severe combined systolic and diastolic CHF (EF 60%), presented to [facility] ED with main concern of progressively worsening shortness of breath, worse with exertion and somewhat better with resting but no other alleviating factors. Pt denied fevers, chills, chest pain.  In ED, pt noted to be hemodynamically stable but hypoxic with oxygen saturations in mid 80's or RA (please note he is not on oxygen at home). BMP notable for Cr 1.49. TRH asked to admit to SDU for further evaluation and management.   Assessment & Plan   Principal Problem:  Acute respiratory failure with hypoxia  Likely combination of community acquired pneumonia, acute COPD exacerbation and acute on chronic diastolic CHF  Respiratory status remains stable. Continue azithromycin and rocephin for treatment of pneumonia. Respiratory culture grew oropharyngeal flora. Legionella and strep pneumonia results negative.  Continue duoneb every 6 hours scheduled and every 2 hours PRN shortness of breath or wheezing for COPD exacerbation. On  PO steroids. Given 2 boluses of IV lasix since admission on admission. Cardio consulted and appreciate their recommendations. Oxygen support via [place] to keep O2 saturation above 90%   Lung mass CXR shows mass like opacity in the right apex, will get pulmonary consultation.  Active Problems:  Tachy-brady / irregular hear rate   Rate controlled this am. Per cardio, started Zebeta 2.5 mg PO daily and Bidil TID COPD (chronic obstructive pulmonary disease)  Continue duoneb every 6 hours scheduled and every 2 hours PRN. Continue solumedrol 40 mg IV daily for next 24 hours.  Continue abx for pneumonia,  azithromycin and rocephin.  Oxygen support via [place] to keep O2 saturation above 90%  COPD gold alert order placed  Community acquired pneumonia  Continue azithromycin and rocephin  Blood cultures, resp culture pending.  Systolic and diastolic CHF, acute on chronic  BNP elevated in 10,000 range which may be also due to renal insufficiency.  Pt has received 2 boluses of lasix since admission. Per cardio, lasix not recommended at this time. Pt not in volume overload. Started zebeta Replace electrolytes as needed  HTN (hypertension)  Hold fosinopril and hctz  BP on admission on lower side so held antihypertensives. Will give 1 dose of lasix (has gotten 40 mg IV on admission)  Appreciate cardio input on BP meds.  Hyperlipidemia  Continue statin therapy  Chronic kidney disease, stage IV  Has had elevated creatinine 1.5 in 2013. This admission creatinine lower than baseline value  Severe protein calorie malnutrition  In the context of chronic illness  Nutrition consulted  DVT Prophylaxis   Lovenox subQ while pt is in hospital    Code Status: Full.  Family Communication: plan of care discussed with the patient  Disposition Plan: transfer to telemetry; order placed..    IV Access:   Peripheral IV Procedures and diagnostic studies:   Dg Chest 2 View 05/17/2014 IMPRESSION: Patchy parenchymal disease throughout the right lung and probably involving the left lower lung. Findings are suggestive for multi-focal pneumonia. Probable small effusions. Persistent density near the right lung apex is nonspecific. Recommend follow-up chest radiographs to ensure resolution.  Medical Consultants:   Cardiology  Other Consultants:   Nutrition Physical therapy  Anti-Infectives:   Azithromycin  05/17/2014 -->  Rocephin 05/17/2014 -->   Oswald Hillock, MD  [facility] Pager [phone]  If 7PM-7AM, please contact night-coverage [email or web address] Password [email or web address] 05/21/2014, 3:24 PM   LOS: 4 days     HPI/Subjective: Patient seen and examined, denies new complaints.  Objective: Filed Vitals:   05/21/14 0821 05/21/14 1202 05/21/14 1343 05/21/14 1438  BP:    128/74  Pulse:    92  Temp:    99.4 F (37.4 C)  TempSrc:    Oral  Resp:    20  Height:      Weight:      SpO2: 98% 97% 85% 100%    Intake/Output Summary (Last 24 hours) at 05/21/14 1524 Last data filed at 05/21/14 1439  Gross per 24 hour  Intake     60 ml  Output    550 ml  Net   -490 ml    Exam:  Physical Exam: Head: Normocephalic, atraumatic.  Neck: supple,No deformities, masses, or tenderness noted. Lungs: Normal respiratory effort. B/L Clear to auscultation, no crackles or wheezes.  Heart: Regular RR. S1 and S2 normal  Abdomen: BS normoactive. Soft, Nondistended, non-tender.  Extremities: No pretibial edema, no erythema   Data Reviewed: Basic Metabolic Panel:  Recent Labs Lab 05/17/14 0902 05/17/14 1315 05/18/14 0400 05/19/14 0348 05/20/14 0444 05/21/14 0446  NA 140 139 144 141 144 147  K 3.7 3.6* 3.5* 3.9 4.1 4.0  CL 103 100 107 104 106 110  CO2 21 25 22 25 26 27   GLUCOSE 134* 105* 112* 93 113* 98  BUN 17 17 27* 31* 28* 27*  CREATININE 1.49* 1.36* 1.72* 1.43* 1.24 1.21  CALCIUM 9.5 9.7 9.4 9.8 10.1 9.8  MG  --  1.5  --   --   --   --   PHOS  --  2.8  --   --   --   --    Liver Function Tests:  Recent Labs Lab 05/17/14 0902 05/17/14 1315 05/18/14 0400  AST 58* 43* 17  ALT 38 37 22  ALKPHOS 141* 155* 118*  BILITOT 0.4 0.4 0.2*  PROT 7.3 8.3 7.0  ALBUMIN 2.4* 2.9* 2.5*   No results found for this basename: LIPASE, AMYLASE,  in the last 168 hours No results found for this basename: AMMONIA,  in the last 168 hours CBC:  Recent Labs Lab 05/17/14 0902 05/17/14 1315 05/18/14 0400 05/19/14 0348 05/20/14 0444 05/21/14 0446  WBC 7.4 5.8 6.2 7.8 8.4 7.2  NEUTROABS 6.0 5.0  --   --   --   --   HGB 10.6* 12.6* 10.8* 10.8* 10.8* 9.9*  HCT 31.9* 35.9* 32.2* 31.3* 31.8* 29.2*   MCV 83.7 81.4 81.5 81.5 81.3 81.8  PLT 268 271 249 298 310 277   Cardiac Enzymes:  Recent Labs Lab 05/17/14 0902  TROPONINI <0.30   BNP: No components found with this basename: POCBNP,  CBG:  Recent Labs Lab 05/18/14 0748 05/19/14 0752 05/20/14 0740 05/21/14 0747  GLUCAP 93 98 103* 92    URINE CULTURE     Status: None   Collection Time    05/17/14 12:17 PM      Result Value Ref Range Status   Specimen Description URINE, CLEAN CATCH   Final   Value: NO GROWTH     Performed at [facility]   Report Status 05/18/2014 FINAL   Final  MRSA PCR SCREENING     Status: None   Collection Time  05/17/14 12:45 PM      Result Value Ref Range Status   MRSA by PCR NEGATIVE  NEGATIVE Final  CULTURE, RESPIRATORY (NON-EXPECTORATED)     Status: None   Collection Time    05/17/14  3:30 PM      Result Value Ref Range Status   Specimen Description SPUTUM   Final   Special Requests NONE   Final   Gram Stain     Final   Value: RARE WBC PRESENT, PREDOMINANTLY PMN     FEW SQUAMOUS EPITHELIAL CELLS PRESENT     FEW GRAM POSITIVE COCCI     IN PAIRS IN CLUSTERS RARE GRAM POSITIVE RODS     RARE GRAM NEGATIVE RODS   Culture     Final   Value: NO GROWTH     Performed at [facility]   Report Status PENDING   Incomplete     Scheduled Meds: . allopurinol  300 mg Oral Daily  . aspirin EC  81 mg Oral Daily  . atorvastatin  20 mg Oral q1800  . azithromycin  500 mg Intravenous Q24H  . bisoprolol  2.5 mg Oral Daily  . cefTRIAXone   1 g Intravenous Q24H  . enoxaparin (LOVENOX)   30 mg Subcutaneous Q24H  . feeding supplement   237 mL Oral TID BM  . ipratropium-albuterol  3 mL Nebulization QID  . isosorbide-hydrALAZINE  0.5 tablet Oral TID  . methylPREDNISolone   40 mg Intravenous Q24H  . zolpidem  5 mg Oral QHS

## 2014-05-22 DIAGNOSIS — R222 Localized swelling, mass and lump, trunk: Secondary | ICD-10-CM

## 2014-05-22 LAB — DIFFERENTIAL
BASOS PCT: 0 % (ref 0–1)
Basophils Absolute: 0 10*3/uL (ref 0.0–0.1)
EOS ABS: 0.2 10*3/uL (ref 0.0–0.7)
Eosinophils Relative: 2 % (ref 0–5)
Lymphocytes Relative: 13 % (ref 12–46)
Lymphs Abs: 1 10*3/uL (ref 0.7–4.0)
MONO ABS: 0.7 10*3/uL (ref 0.1–1.0)
MONOS PCT: 9 % (ref 3–12)
Neutro Abs: 6 10*3/uL (ref 1.7–7.7)
Neutrophils Relative %: 76 % (ref 43–77)

## 2014-05-22 LAB — CBC
HCT: 30.2 % — ABNORMAL LOW (ref 39.0–52.0)
Hemoglobin: 10.1 g/dL — ABNORMAL LOW (ref 13.0–17.0)
MCH: 27.4 pg (ref 26.0–34.0)
MCHC: 33.4 g/dL (ref 30.0–36.0)
MCV: 82.1 fL (ref 78.0–100.0)
PLATELETS: 274 10*3/uL (ref 150–400)
RBC: 3.68 MIL/uL — ABNORMAL LOW (ref 4.22–5.81)
RDW: 15.1 % (ref 11.5–15.5)
WBC: 8 10*3/uL (ref 4.0–10.5)

## 2014-05-22 LAB — BASIC METABOLIC PANEL
Anion gap: 11 (ref 5–15)
BUN: 26 mg/dL — ABNORMAL HIGH (ref 6–23)
CALCIUM: 9.9 mg/dL (ref 8.4–10.5)
CO2: 26 mEq/L (ref 19–32)
CREATININE: 1.14 mg/dL (ref 0.50–1.35)
Chloride: 106 mEq/L (ref 96–112)
GFR calc Af Amer: 66 mL/min — ABNORMAL LOW (ref >90)
GFR, EST NON AFRICAN AMERICAN: 57 mL/min — AB (ref >90)
GLUCOSE: 106 mg/dL — AB (ref 70–99)
Potassium: 4 mEq/L (ref 3.7–5.3)
SODIUM: 143 meq/L (ref 137–147)

## 2014-05-22 LAB — GLUCOSE, CAPILLARY: Glucose-Capillary: 107 mg/dL — ABNORMAL HIGH (ref 70–99)

## 2014-05-22 LAB — PRO B NATRIURETIC PEPTIDE: Pro B Natriuretic peptide (BNP): 15127 pg/mL — ABNORMAL HIGH (ref 0–450)

## 2014-05-22 MED ORDER — FUROSEMIDE 10 MG/ML IJ SOLN
20.0000 mg | Freq: Two times a day (BID) | INTRAMUSCULAR | Status: DC
  Administered 2014-05-22 – 2014-05-24 (×5): 20 mg via INTRAVENOUS
  Filled 2014-05-22 (×6): qty 2

## 2014-05-22 MED ORDER — PREDNISONE 20 MG PO TABS
40.0000 mg | ORAL_TABLET | Freq: Every day | ORAL | Status: AC
  Administered 2014-05-23: 40 mg via ORAL
  Filled 2014-05-22 (×2): qty 2

## 2014-05-22 MED ORDER — PREDNISONE 20 MG PO TABS
20.0000 mg | ORAL_TABLET | Freq: Every day | ORAL | Status: AC
  Administered 2014-05-25: 20 mg via ORAL
  Filled 2014-05-22: qty 1

## 2014-05-22 MED ORDER — DIGOXIN 0.0625 MG HALF TABLET
0.0625 mg | ORAL_TABLET | Freq: Every day | ORAL | Status: DC
  Administered 2014-05-22 – 2014-05-23 (×2): 0.0625 mg via ORAL
  Filled 2014-05-22 (×3): qty 1

## 2014-05-22 MED ORDER — PREDNISONE 5 MG PO TABS
5.0000 mg | ORAL_TABLET | Freq: Every day | ORAL | Status: DC
  Filled 2014-05-22: qty 1

## 2014-05-22 MED ORDER — PREDNISONE 20 MG PO TABS
30.0000 mg | ORAL_TABLET | Freq: Every day | ORAL | Status: AC
  Administered 2014-05-24: 30 mg via ORAL
  Filled 2014-05-22 (×2): qty 1

## 2014-05-22 MED ORDER — PREDNISONE 10 MG PO TABS
10.0000 mg | ORAL_TABLET | Freq: Every day | ORAL | Status: AC
  Administered 2014-05-26: 10 mg via ORAL
  Filled 2014-05-22: qty 1

## 2014-05-22 NOTE — Progress Notes (Addendum)
Name: Edward Nunez MRN: [id number] DOB: June 28, 1926    ADMISSION DATE:  05/17/2014 CONSULTATION DATE:  05/21/14  REFERRING MD :  Dr. Darrick Meigs  PRIMARY SERVICE:  TRH   CHIEF COMPLAINT:  Abnormal CXR, concern for mass   SIGNIFICANT EVENTS: 8/09  Admit with concerns for CAP + CHF exacerbation  8/12  PCCM consulted for RUL lung mass  STUDIES:  8/09  ECHO >> mod LVH, EF 20%, severe global hypokinesis, diastolic dysfunction, mod AS,  trivial pericardial effusion    CULTURES: Sputum 8/9 >> nml flora UC 8/10 >>neg   ANTIBIOTICS: Per Primary SVC  HISTORY OF PRESENT ILLNESS:  80 y/o M, former smoker, with a PMH of HTN, HLD, CHF, CAD s/p PTCA with stent placement, COPD and prostate cancer s/p radioactive seed implants who presented to [place] on 05/17/14 via EMS with  complaints of progressive shortness of breath.  He was recently seen by the [facility] and given an inhaler.  He was scheduled for a lung biopsy by the [facility] on 8/10 but did not make the appointment due to current admission.    Patients initial evaluation noted saturations in the low 80's.  Initial CXR demonstrated hyperinflation, emphysema, & patchy R densities throughout the lung with more prominent consolidation near the R apex.  He was admitted by [facility] and treated for CAP & CHF exacerbation.  ECHO was completed which was notable for an EF of 20%, global hypokinesis, moderate AS and diastolic dysfunction.  He was evaluated by Cardiology and felt to be compensated by the time of evaluation on 8/10 after diuresis.  The patient made slow improvement and follow up CXR was assessed on 8/12 which raised concerns for RUL mass.  PCCM consulted 8/13 for pulmonary evaluation.    SUBJECTIVE: desatn this am placed on NRB oob to chair, denies chest pain, cough Able to speak in full sentences  VITAL SIGNS: Temp:  [97.4 F (36.3 C)-99.4 F (37.4 C)] 97.7 F (36.5 C) (08/14 0446) Pulse Rate:  [92-108] 102 (08/14 0446) Resp:   [20] 20 (08/14 0446) BP: (128-159)/(74-88) 143/80 mmHg (08/14 0446) SpO2:  [85 %-100 %] 96 % (08/14 0659) Weight:  [141 lb 12.1 oz (64.3 kg)] 141 lb 12.1 oz (64.3 kg) (08/14 0446)  PHYSICAL EXAMINATION: General:  Thin adult male, in chair, accessory muscles mild + Neuro:  AAOx4, speech clear, MAE  HEENT:  Mm pink/moist, no jvd, good dentition  Cardiovascular:  s1s2 rrr, no m/r/g, distant tones, no edema Lungs:  resp's even/non-labored, decreased BS BL, no rhonchi Musculoskeletal:  No acute deformities  Skin:  Warm/dry, no lesions / rashes   Recent Labs Lab 05/20/14 0444 05/21/14 0446 05/22/14 0407  NA 144 147 143  K 4.1 4.0 4.0  CL 106 110 106  CO2 26 27 26   BUN 28* 27* 26*  CREATININE 1.24 1.21 1.14  GLUCOSE 113* 98 106*    Recent Labs Lab 05/20/14 0444 05/21/14 0446 05/22/14 0407  HGB 10.8* 9.9* 10.1*  HCT 31.8* 29.2* 30.2*  WBC 8.4 7.2 8.0  PLT 310 277 274   Dg Chest 2 View  05/20/2014   CLINICAL DATA:  Weakness is shortness of breath, possible pneumonia  EXAM: CHEST  2 VIEW  COMPARISON:  05/17/2014  FINDINGS: Heavy diffuse interstitial infiltrate throughout the right lung, minimally improved. More subtle interstitial infiltrate left lower lobe stable. More focal consolidative ovoid 2 cm right apical opacity stable. There is a small right pleural effusion.  IMPRESSION: Findings again  consistent with mild left and severe right airspace disease suggesting multifocal pneumonia. There has been minimal improvement on the right.  Again seen is a more masslike opacity in the right apex. Followup to document resolution is recommended. This opacity is new from 2010 and also represents a change from 2014. Right apical mass is not excluded.   Electronically Signed   By: Skipper Cliche M.D.   On: 05/20/2014 13:37    ASSESSMENT / PLAN:  RUL  Mass -.  Patient has had 3 CT scans at the [facility] in the last year.  He was set up for a bronchoscopy for 8/10 but did not make the appt  due to current admission.   BL fibrotic changes - wih increased Rt airspace disease comapred to 11/2013 CXR -being treated as CAP, could be all fibrotic disease, will need to compare with CT records from [place] Acute Hypoxic Respiratory Failure COPD, doubt exacerbation CAP   Plan:  -Oxygen to support saturations of 88% & above -Complete 7 days abx for CAP, ok to consider narrowing to oral medications once clinically improved -Keep euvolemic -Obtain CT & progress note records from the [facility] further imaging until these can be reviewed -Likely, he will be able to return to the [place] to complete his work up for the RUL mass like opacity, would not pursue any work up here -he will need home o2 -Po prednisone OK, taper once clinical improvement  Protein Calorie Malnutrition   Plan: Ensure TID  Kara Mead MD. Shade Flood. [facility] Pager [phone] If no response call [phone]   05/22/2014 10:25 AM

## 2014-05-22 NOTE — Progress Notes (Signed)
Patient ID: Edward Nunez, male   DOB: [birth year], 80 y.o.   MRN: [id number] [facility] PROGRESS NOTE  Edward Nunez TIR:[id number] DOB: [birth year] DOA: 05/17/2014 PCP: [name], MD  Brief narrative: Pt is 80 yo male with HTN, HLD, prostate cancer but on no active treatment, CKD stage II - III, COPD, severe combined systolic and diastolic CHF (EF 67%), presented to [facility] ED with main concern of progressively worsening shortness of breath, worse with exertion and somewhat better with resting but no other alleviating factors. Pt denied fevers, chills, chest pain.  In ED, pt noted to be hemodynamically stable but hypoxic with oxygen saturations in mid 80's or RA (please note he is not on oxygen at home). BMP notable for Cr 1.49. TRH asked to admit to SDU for further evaluation and management.   Assessment & Plan   Principal Problem:  Acute respiratory failure with hypoxia  Likely combination of community acquired pneumonia, acute COPD exacerbation and acute on chronic diastolic CHF  Respiratory status remains stable. Continue azithromycin and rocephin for treatment of pneumonia. Respiratory culture grew oropharyngeal flora. Legionella and strep pneumonia results negative.  Continue duoneb every 6 hours scheduled and every 2 hours PRN shortness of breath or wheezing for COPD exacerbation. On  PO steroids. Given 2 boluses of IV lasix since admission on admission. Cardio consulted and appreciate their recommendations. Oxygen support via [place] to keep O2 saturation above 90%   Lung mass CXR shows mass like opacity in the right apex, pulmonary has seen the patient. [facility].  Active Problems:  Tachy-brady / irregular hear rate   Rate controlled this am. Per cardio, started Zebeta 2.5 mg PO daily and Bidil TID COPD (chronic obstructive pulmonary disease)  Continue duoneb every 6 hours scheduled and every 2 hours PRN. Continue solumedrol 40  mg IV daily for next 24 hours.  Continue abx for pneumonia, azithromycin and rocephin.  Oxygen support via [place] to keep O2 saturation above 90%  COPD gold alert order placed   Community acquired pneumonia  Continue azithromycin and rocephin  Blood cultures, resp culture pending.   Systolic and diastolic CHF, acute on chronic  [place] [address],   Started zebeta, Bidil Will start IV Lasix 20 mg every 12 hours Replace electrolytes as needed   HTN (hypertension)  Hold fosinopril and hctz  BP on admission on lower side so held antihypertensives.  Appreciate cardio input on BP meds.  Hyperlipidemia  Continue statin therapy  Chronic kidney disease, stage IV  Has had elevated creatinine 1.5 in 2013. This admission creatinine lower than baseline value  Severe protein calorie malnutrition  In the context of chronic illness  Nutrition consulted  DVT Prophylaxis   Lovenox subQ while pt is in hospital    Code Status: Full.  Family Communication: plan of care discussed with the patient  Disposition Plan: transfer to telemetry; order placed..    IV Access:   Peripheral IV Procedures and diagnostic studies:   Dg Chest 2 View 05/17/2014 IMPRESSION: Patchy parenchymal disease throughout the right lung and probably involving the left lower lung. Findings are suggestive for multi-focal pneumonia. Probable small effusions. Persistent density near the right lung apex is nonspecific. Recommend follow-up chest radiographs to ensure resolution.  Medical Consultants:   Cardiology  Other Consultants:   Nutrition Physical therapy  Anti-Infectives:   Azithromycin 05/17/2014 -->  Rocephin 05/17/2014 -->   Edward Hillock, MD  [facility] Pager [phone]  If 7PM-7AM, please contact night-coverage [email or web address] Password [facility] 05/22/2014, 4:12 PM   LOS: 5 days    HPI/Subjective: Patient seen and examined, became hypoxic this am, O2 sats dropped to 80's and required 100% oxygen via  nonrebreather mask  Objective: Filed Vitals:   05/22/14 0446 05/22/14 0659 05/22/14 1211 05/22/14 1254  BP: 143/80   119/68  Pulse: 102   81  Temp: 97.7 F (36.5 C)   97.9 F (36.6 C)  TempSrc: Oral   Oral  Resp: 20   18  Height:      Weight: 64.3 kg (141 lb 12.1 oz)     SpO2: 96% 96% 96% 95%    Intake/Output Summary (Last 24 hours) at 05/22/14 1612 Last data filed at 05/22/14 1226  Gross per 24 hour  Intake    480 ml  Output   1200 ml  Net   -720 ml    Exam:  Physical Exam: Head: Normocephalic, atraumatic.  Neck: supple,No deformities, masses, or tenderness noted. Lungs: Tachypenic Heart: Regular RR. S1 and S2 normal  Abdomen: BS normoactive. Soft, Nondistended, non-tender.  Extremities: No pretibial edema, no erythema   Data Reviewed: Basic Metabolic Panel:  Recent Labs Lab 05/17/14 0902 05/17/14 1315 05/18/14 0400 05/19/14 0348 05/20/14 0444 05/21/14 0446 05/22/14 0407  NA 140 139 144 141 144 147 143  K 3.7 3.6* 3.5* 3.9 4.1 4.0 4.0  CL 103 100 107 104 106 110 106  CO2 21 25 22 25 26 27 26   GLUCOSE 134* 105* 112* 93 113* 98 106*  BUN 17 17 27* 31* 28* 27* 26*  CREATININE 1.49* 1.36* 1.72* 1.43* 1.24 1.21 1.14  CALCIUM 9.5 9.7 9.4 9.8 10.1 9.8 9.9  MG  --  1.5  --   --   --   --   --   PHOS  --  2.8  --   --   --   --   --    Liver Function Tests:  Recent Labs Lab 05/17/14 0902 05/17/14 1315 05/18/14 0400  AST 58* 43* 17  ALT 38 37 22  ALKPHOS 141* 155* 118*  BILITOT 0.4 0.4 0.2*  PROT 7.3 8.3 7.0  ALBUMIN 2.4* 2.9* 2.5*   No results found for this basename: LIPASE, AMYLASE,  in the last 168 hours No results found for this basename: AMMONIA,  in the last 168 hours CBC:  Recent Labs Lab 05/17/14 0902 05/17/14 1315 05/18/14 0400 05/19/14 0348 05/20/14 0444 05/21/14 0446 05/22/14 0407  WBC 7.4 5.8 6.2 7.8 8.4 7.2 8.0  NEUTROABS 6.0 5.0  --   --   --   --  6.0  HGB 10.6* 12.6* 10.8* 10.8* 10.8* 9.9* 10.1*  HCT 31.9* 35.9* 32.2*  31.3* 31.8* 29.2* 30.2*  MCV 83.7 81.4 81.5 81.5 81.3 81.8 82.1  PLT 268 271 249 298 310 277 274   Cardiac Enzymes:  Recent Labs Lab 05/17/14 0902  TROPONINI <0.30   BNP: No components found with this basename: POCBNP,  CBG:  Recent Labs Lab 05/18/14 0748 05/19/14 0752 05/20/14 0740 05/21/14 0747 05/22/14 0731  GLUCAP 93 98 103* 92 107*    URINE CULTURE     Status: None   Collection Time    05/17/14 12:17 PM      Result Value Ref Range Status   Specimen Description URINE, CLEAN CATCH   Final   Value: NO GROWTH     Performed at [facility]   Report Status 05/18/2014 FINAL  Final  MRSA PCR SCREENING     Status: None   Collection Time    05/17/14 12:45 PM      Result Value Ref Range Status   MRSA by PCR NEGATIVE  NEGATIVE Final  CULTURE, RESPIRATORY (NON-EXPECTORATED)     Status: None   Collection Time    05/17/14  3:30 PM      Result Value Ref Range Status   Specimen Description SPUTUM   Final   Special Requests NONE   Final   Gram Stain     Final   Value: RARE WBC PRESENT, PREDOMINANTLY PMN     FEW SQUAMOUS EPITHELIAL CELLS PRESENT     FEW GRAM POSITIVE COCCI     IN PAIRS IN CLUSTERS RARE GRAM POSITIVE RODS     RARE GRAM NEGATIVE RODS   Culture     Final   Value: NO GROWTH     Performed at [facility]   Report Status PENDING   Incomplete     Scheduled Meds: . allopurinol  300 mg Oral Daily  . aspirin EC  81 mg Oral Daily  . atorvastatin  20 mg Oral q1800  . azithromycin  500 mg Intravenous Q24H  . bisoprolol  2.5 mg Oral Daily  . cefTRIAXone   1 g Intravenous Q24H  . enoxaparin (LOVENOX)   30 mg Subcutaneous Q24H  . feeding supplement   237 mL Oral TID BM  . ipratropium-albuterol  3 mL Nebulization QID  . isosorbide-hydrALAZINE  0.5 tablet Oral TID  . methylPREDNISolone   40 mg Intravenous Q24H  . zolpidem  5 mg Oral QHS

## 2014-05-22 NOTE — Progress Notes (Signed)
Desated to 82% on 3l nasal cannula, Non rebreather mask applied, Dr Darrick Meigs notified. Now sats are 99% on mask.

## 2014-05-22 NOTE — Progress Notes (Signed)
.tk    Patient Profile: 80 yo with history of chronic systolic CHF and probable ischemic cardiomyopathy as well as COPD and CKD was admitted with dyspnea and hypoxia.    Subjective:  Appears more dyspneic this am  Objective:   Vital Signs in the last 24 hours: Temp:  [97.4 F (36.3 C)-99.4 F (37.4 C)] 97.7 F (36.5 C) (08/14 0446) Pulse Rate:  [92-108] 102 (08/14 0446) Resp:  [20] 20 (08/14 0446) BP: (128-159)/(74-88) 143/80 mmHg (08/14 0446) SpO2:  [85 %-100 %] 96 % (08/14 0659) Weight:  [141 lb 12.1 oz (64.3 kg)] 141 lb 12.1 oz (64.3 kg) (08/14 0446)  Intake/Output from previous day: -360 08/13 0701 - 08/14 0700 In: 340 [P.O.:240; IV Piggyback:100] Out: 700 [Urine:700]  I/O since admission: -2105  Medications: . allopurinol  300 mg Oral Daily  . antiseptic oral rinse  7 mL Mouth Rinse BID  . aspirin EC  81 mg Oral Daily  . atorvastatin  20 mg Oral q1800  . azithromycin  500 mg Intravenous Q24H  . bisoprolol  5 mg Oral Daily  . cefTRIAXone (ROCEPHIN)  IV  1 g Intravenous Q24H  . enoxaparin (LOVENOX) injection  40 mg Subcutaneous Q24H  . feeding supplement (ENSURE COMPLETE)  237 mL Oral TID BM  . ipratropium-albuterol  3 mL Nebulization QID  . isosorbide-hydrALAZINE  1 tablet Oral TID  . predniSONE  50 mg Oral Q breakfast  . sodium chloride  3 mL Intravenous Q12H  . zolpidem  5 mg Oral QHS       Physical Exam:   General appearance: alert, cooperative Neck: no adenopathy, no carotid bruit, no JVD, supple, symmetrical, trachea midline and thyroid not enlarged, symmetric, no tenderness/mass/nodules Lungs: Decreased BS R>L; no wheezing Heart: regular rate and rhythm and 1/0 sem Abdomen: soft, non-tender; bowel sounds normal; no masses,  no organomegaly Extremities: no edema, redness or tenderness in the calves or thighs Neurologic: Grossly normal   Rate: 95  Rhythm: Sinus  Lab Results:   Recent Labs  05/21/14 0446 05/22/14 0407  NA 147 143  K 4.0  4.0  CL 110 106  CO2 27 26  GLUCOSE 98 106*  BUN 27* 26*  CREATININE 1.21 1.14   CBC    Component Value Date/Time   WBC 8.0 05/22/2014 0407   WBC 5.3 12/28/2013 0930   RBC 3.68* 05/22/2014 0407   RBC 5.02 12/28/2013 0930   HGB 10.1* 05/22/2014 0407   HGB 13.6* 12/28/2013 0930   HCT 30.2* 05/22/2014 0407   HCT 42.8* 12/28/2013 0930   PLT 274 05/22/2014 0407   MCV 82.1 05/22/2014 0407   MCV 85.3 12/28/2013 0930   MCH 27.4 05/22/2014 0407   MCH 27.1 12/28/2013 0930   MCHC 33.4 05/22/2014 0407   MCHC 31.8 12/28/2013 0930   RDW 15.1 05/22/2014 0407   LYMPHSABS 0.6* 05/17/2014 1315   MONOABS 0.1 05/17/2014 1315   EOSABS 0.0 05/17/2014 1315   BASOSABS 0.0 05/17/2014 1315    CBC Latest Ref Rng 05/22/2014 05/21/2014 05/20/2014  WBC 4.0 - 10.5 K/uL 8.0 7.2 8.4  Hemoglobin 13.0 - 17.0 g/dL 10.1(L) 9.9(L) 10.8(L)  Hematocrit 39.0 - 52.0 % 30.2(L) 29.2(L) 31.8(L)  Platelets 150 - 400 K/uL 274 277 310    No results found for this basename: TROPONINI, CK, MB,  in the last 72 hours  Hepatic Function Panel No results found for this basename: PROT, ALBUMIN, AST, ALT, ALKPHOS, BILITOT, BILIDIR, IBILI,  in the last 72 hours No  results found for this basename: INR,  in the last 72 hours BNP (last 3 results)  Recent Labs  05/17/14 0902 05/22/14 0407  PROBNP 10162.0* 15127.0*    Lipid Panel     Component Value Date/Time   CHOL 290* 12/28/2013 0921   TRIG 120 12/28/2013 0921   HDL 56 12/28/2013 0921   CHOLHDL 5.2 12/28/2013 0921   VLDL 24 12/28/2013 0921   LDLCALC 210* 12/28/2013 0921      Imaging:  Dg Chest 2 View  05/20/2014   CLINICAL DATA:  Weakness is shortness of breath, possible pneumonia  EXAM: CHEST  2 VIEW  COMPARISON:  05/17/2014  FINDINGS: Heavy diffuse interstitial infiltrate throughout the right lung, minimally improved. More subtle interstitial infiltrate left lower lobe stable. More focal consolidative ovoid 2 cm right apical opacity stable. There is a small right pleural effusion.   IMPRESSION: Findings again consistent with mild left and severe right airspace disease suggesting multifocal pneumonia. There has been minimal improvement on the right.  Again seen is a more masslike opacity in the right apex. Followup to document resolution is recommended. This opacity is new from 2010 and also represents a change from 2014. Right apical mass is not excluded.   Electronically Signed   By: Skipper Cliche M.D.   On: 05/20/2014 13:37      Assessment/Plan:   Principal Problem:   Acute respiratory failure with hypoxia Active Problems:   HTN (hypertension)   Hyperlipidemia   COPD (chronic obstructive pulmonary disease)   Community acquired pneumonia   Acute renal failure   Systolic and diastolic CHF, acute on chronic   CKD (chronic kidney disease) stage 4, GFR 15-29 ml/min   Protein-calorie malnutrition, severe   Lung mass  1. Community-acquired PNA: Patchy parenchymal disease throughout the right lung and probably involving the left lower lung. Findings are suggestive for multi-focal pneumonia. F/U  CXR with mild left and severe right airspace disease suggesting multifocal pneumonia. There has been minimal improvement on the right.  Again seen is a more masslike opacity in the right apex. Antibiotics per primary service. Pt seen by pulmonary service yesterday. 2. COPD exacerbation: He is being treated with nebs and steroids. No wheezing today.  3. CHF: Acute/chronic systolic CHF, EF 53%. Now on bidil 1 tid . Now on  bisoprolol in place of Coreg for improved cardioselectivity,  titrated to 5 mg yesterday. Not on ACE-I with recent renal insuff. BNP elevated at 15,127 today. Will add lanoxin at 0.0625 mg daily. - Has wide QRS, LBBB. Given age, not ICD candidate but could consider CRT for symptom control.  4. Renal insufficiency: Cr continues to improve Cr 1.14 today 5. Hyperlipidemia; Chol 290, LDL 210 from earlier in year; will increase atorvastain to 40 mg for more aggressive  treatment. 6. Anemia; H/H 10.1/30.2     Troy Sine, MD, [facility] 05/22/2014, 7:48 AM

## 2014-05-22 NOTE — Progress Notes (Signed)
Faxed request to [place], and [place] for pulmonary progress notes and chest CT scans.

## 2014-05-23 DIAGNOSIS — I10 Essential (primary) hypertension: Secondary | ICD-10-CM

## 2014-05-23 LAB — BASIC METABOLIC PANEL
Anion gap: 10 (ref 5–15)
BUN: 28 mg/dL — AB (ref 6–23)
CALCIUM: 10 mg/dL (ref 8.4–10.5)
CO2: 30 mEq/L (ref 19–32)
Chloride: 106 mEq/L (ref 96–112)
Creatinine, Ser: 1.35 mg/dL (ref 0.50–1.35)
GFR, EST AFRICAN AMERICAN: 54 mL/min — AB (ref >90)
GFR, EST NON AFRICAN AMERICAN: 46 mL/min — AB (ref >90)
Glucose, Bld: 95 mg/dL (ref 70–99)
Potassium: 4 mEq/L (ref 3.7–5.3)
Sodium: 146 mEq/L (ref 137–147)

## 2014-05-23 LAB — GLUCOSE, CAPILLARY: Glucose-Capillary: 99 mg/dL (ref 70–99)

## 2014-05-23 LAB — CBC
HCT: 31.5 % — ABNORMAL LOW (ref 39.0–52.0)
Hemoglobin: 10.6 g/dL — ABNORMAL LOW (ref 13.0–17.0)
MCH: 27.5 pg (ref 26.0–34.0)
MCHC: 33.7 g/dL (ref 30.0–36.0)
MCV: 81.6 fL (ref 78.0–100.0)
PLATELETS: 302 10*3/uL (ref 150–400)
RBC: 3.86 MIL/uL — AB (ref 4.22–5.81)
RDW: 15.1 % (ref 11.5–15.5)
WBC: 7.3 10*3/uL (ref 4.0–10.5)

## 2014-05-23 NOTE — Progress Notes (Signed)
AM care offered pt refused. Stating that is wife and daughter will be bring him fresh underwear later and he will take bath then

## 2014-05-23 NOTE — Progress Notes (Signed)
Patient Profile: 80 yo with history of chronic systolic CHF and probable ischemic cardiomyopathy as well as COPD and CKD was admitted with dyspnea and hypoxia.    Subjective:   He is feeling much better. He was in the [employer] and [employer].  Goes to the [place] and [place] .   Has been a Administrator.    Objective:   Vital Signs in the last 24 hours: Temp:  [97.6 F (36.4 C)-98 F (36.7 C)] 98 F (36.7 C) (08/15 0430) Pulse Rate:  [81-108] 86 (08/15 0430) Resp:  [18-20] 19 (08/15 0430) BP: (119-132)/(68-82) 130/82 mmHg (08/15 0430) SpO2:  [95 %-100 %] 100 % (08/15 0430) Weight:  [141 lb 1.5 oz (64 kg)] 141 lb 1.5 oz (64 kg) (08/15 0430)  Intake/Output from previous day: -360 08/14 0701 - 08/15 0700 In: 740 [P.O.:720; IV Piggyback:20] Out: 1330 [WUXLK:4401]  I/O since admission: -2105  Medications: . allopurinol  300 mg Oral Daily  . antiseptic oral rinse  7 mL Mouth Rinse BID  . aspirin EC  81 mg Oral Daily  . atorvastatin  20 mg Oral q1800  . azithromycin  500 mg Intravenous Q24H  . bisoprolol  5 mg Oral Daily  . cefTRIAXone (ROCEPHIN)  IV  1 g Intravenous Q24H  . digoxin  0.0625 mg Oral Daily  . enoxaparin (LOVENOX) injection  40 mg Subcutaneous Q24H  . feeding supplement (ENSURE COMPLETE)  237 mL Oral TID BM  . furosemide  20 mg Intravenous Q12H  . ipratropium-albuterol  3 mL Nebulization QID  . isosorbide-hydrALAZINE  1 tablet Oral TID  . predniSONE  40 mg Oral Q breakfast   Followed by  . [START ON 05/24/2014] predniSONE  30 mg Oral Q breakfast   Followed by  . [START ON 05/25/2014] predniSONE  20 mg Oral Q breakfast   Followed by  . [START ON 05/26/2014] predniSONE  10 mg Oral Q breakfast   Followed by  . [START ON 05/27/2014] predniSONE  5 mg Oral Q breakfast  . sodium chloride  3 mL Intravenous Q12H  . zolpidem  5 mg Oral QHS       Physical Exam:   General appearance: alert, cooperative Neck: no adenopathy, no carotid bruit, no  JVD, supple, symmetrical, trachea midline and thyroid not enlarged, symmetric, no tenderness/mass/nodules Lungs:  ; no wheezing Heart: regular rate and rhythm and 1/0 sem Abdomen: soft, non-tender; bowel sounds normal; no masses,  no organomegaly Extremities: no edema, redness or tenderness in the calves or thighs Neurologic: Grossly normal   Rate: 95  Rhythm: Sinus  Lab Results:   Recent Labs  05/22/14 0407 05/23/14 0505  NA 143 146  K 4.0 4.0  CL 106 106  CO2 26 30  GLUCOSE 106* 95  BUN 26* 28*  CREATININE 1.14 1.35   CBC    Component Value Date/Time   WBC 7.3 05/23/2014 0505   WBC 5.3 12/28/2013 0930   RBC 3.86* 05/23/2014 0505   RBC 5.02 12/28/2013 0930   HGB 10.6* 05/23/2014 0505   HGB 13.6* 12/28/2013 0930   HCT 31.5* 05/23/2014 0505   HCT 42.8* 12/28/2013 0930   PLT 302 05/23/2014 0505   MCV 81.6 05/23/2014 0505   MCV 85.3 12/28/2013 0930   MCH 27.5 05/23/2014 0505   MCH 27.1 12/28/2013 0930   MCHC 33.7 05/23/2014 0505   MCHC 31.8 12/28/2013 0930   RDW 15.1 05/23/2014 0505   LYMPHSABS 1.0 05/22/2014 0407  MONOABS 0.7 05/22/2014 0407   EOSABS 0.2 05/22/2014 0407   BASOSABS 0.0 05/22/2014 0407    CBC Latest Ref Rng 05/23/2014 05/22/2014 05/21/2014  WBC 4.0 - 10.5 K/uL 7.3 8.0 7.2  Hemoglobin 13.0 - 17.0 g/dL 10.6(L) 10.1(L) 9.9(L)  Hematocrit 39.0 - 52.0 % 31.5(L) 30.2(L) 29.2(L)  Platelets 150 - 400 K/uL 302 274 277    No results found for this basename: TROPONINI, CK, MB,  in the last 72 hours  Hepatic Function Panel No results found for this basename: PROT, ALBUMIN, AST, ALT, ALKPHOS, BILITOT, BILIDIR, IBILI,  in the last 72 hours No results found for this basename: INR,  in the last 72 hours BNP (last 3 results)  Recent Labs  05/17/14 0902 05/22/14 0407  PROBNP 10162.0* 15127.0*    Lipid Panel     Component Value Date/Time   CHOL 290* 12/28/2013 0921   TRIG 120 12/28/2013 0921   HDL 56 12/28/2013 0921   CHOLHDL 5.2 12/28/2013 0921   VLDL 24 12/28/2013 0921     LDLCALC 210* 12/28/2013 0921      Imaging:  No results found.    Assessment/Plan:   Principal Problem:   Acute respiratory failure with hypoxia Active Problems:   HTN (hypertension)   Hyperlipidemia   COPD (chronic obstructive pulmonary disease)   Community acquired pneumonia   Acute renal failure   Systolic and diastolic CHF, acute on chronic   CKD (chronic kidney disease) stage 4, GFR 15-29 ml/min   Protein-calorie malnutrition, severe   Lung mass  1. Community-acquired PNA: Patchy parenchymal disease throughout the right lung and probably involving the left lower lung. Findings are suggestive for multi-focal pneumonia. F/U  CXR with mild left and severe right airspace disease suggesting multifocal pneumonia. There has been minimal improvement on the right.  Again seen is a more masslike opacity in the right apex. Antibiotics per primary service. Pt seen by pulmonary service yesterday. 2. COPD exacerbation: He is being treated with nebs and steroids. No wheezing today.  3. CHF: Acute/chronic systolic CHF, EF 29%. Now on bidil 1 tid . Now on  bisoprolol in place of Coreg for improved cardioselectivity,  titrated to 5 mg yesterday. Not on ACE-I with recent renal insuff. BNP elevated    lanoxin at 0.0625 mg daily. - Has wide QRS, LBBB. Given age, not ICD candidate but could consider CRT for symptom control.  He sees a cardiologist in the [place] system as well. He may be able to go home and be followed as OP. He can follow up with Dr. Aundra Dubin at [facility] if he wants to follow up here.  Otherwise, he may want to go to the [place].  4. Renal insufficiency: Cr continues to improve Cr 1.14 today 5. Hyperlipidemia; Chol 290, LDL 210 from earlier in year; will increase atorvastain to 40 mg for more aggressive treatment. 6. Anemia; H/H 10.1/30.2   Thayer Headings, Brooke Bonito., MD, [facility] 05/23/2014, 8:24 AM [address]  [place] Pager [phone]

## 2014-05-23 NOTE — Progress Notes (Signed)
ANTIBIOTIC CONSULT NOTE - INITIAL  Pharmacy Consult for levofloxacin Indication: pneumonia and acute COPD exacerbation  No Known Allergies  Patient Measurements: Height: 6' (182.9 cm) Weight: 141 lb 1.5 oz (64 kg) IBW/kg (Calculated) : 77.6  Vital Signs: Temp: 98 F (36.7 C) (08/15 0430) Temp src: Oral (08/15 0430) BP: 130/82 mmHg (08/15 0430) Pulse Rate: 86 (08/15 0430) Intake/Output from previous day: 08/14 0701 - 08/15 0700 In: 740 [P.O.:720; IV Piggyback:20] Out: 1330 [Urine:1330] Intake/Output from this shift: Total I/O In: 240 [P.O.:240] Out: -   Labs:  Recent Labs  05/21/14 0446 05/22/14 0407 05/23/14 0505  WBC 7.2 8.0 7.3  HGB 9.9* 10.1* 10.6*  PLT 277 274 302  CREATININE 1.21 1.14 1.35   Estimated Creatinine Clearance: 36.2 ml/min (by C-G formula based on Cr of 1.35).   Medical History: Past Medical History  Diagnosis Date  . Hyperlipidemia   . Hypertension   . CHF (congestive heart failure)   . COPD (chronic obstructive pulmonary disease)   . Cancer     prostate  . Postsurgical percutaneous transluminal coronary angioplasty (PTCA) status   . S/P coronary artery stent placement     Medications:  Scheduled:  . allopurinol  300 mg Oral Daily  . antiseptic oral rinse  7 mL Mouth Rinse BID  . aspirin EC  81 mg Oral Daily  . atorvastatin  20 mg Oral q1800  . azithromycin  500 mg Intravenous Q24H  . bisoprolol  5 mg Oral Daily  . cefTRIAXone (ROCEPHIN)  IV  1 g Intravenous Q24H  . digoxin  0.0625 mg Oral Daily  . enoxaparin (LOVENOX) injection  40 mg Subcutaneous Q24H  . feeding supplement (ENSURE COMPLETE)  237 mL Oral TID BM  . furosemide  20 mg Intravenous Q12H  . ipratropium-albuterol  3 mL Nebulization QID  . isosorbide-hydrALAZINE  1 tablet Oral TID  . [START ON 05/24/2014] predniSONE  30 mg Oral Q breakfast   Followed by  . [START ON 05/25/2014] predniSONE  20 mg Oral Q breakfast   Followed by  . [START ON 05/26/2014] predniSONE  10 mg  Oral Q breakfast   Followed by  . [START ON 05/27/2014] predniSONE  5 mg Oral Q breakfast  . sodium chloride  3 mL Intravenous Q12H  . zolpidem  5 mg Oral QHS   Assessment: 57 yoM admitted 05/17/14 with acute respiratory failure due to suspected community-acquired pneumonia, acute COPD exacerbation, and acute on chronic CHF. Patient will complete 7 days of ceftriaxone and azithromycin for CAP treatment today. Patient remains on 3L nasal cannula. MD has requested Pharmacy to dose levofloxacin PO for continued treatment.  8/9 >> azithromycin >> 8/13 8/9 >> ceftriaxone >> 8/15 8/15 >> levofloxacin >>  Tmax: afebrile WBCs: WNL Renal: SCr 1.35 (CKD3 baseline SCr 1.2-1.5), CrCl 36 ml/min CG  8/9 MRSA PCR: negative 8/9 urine: NGF  8/9 sputum: normal oropharyngeal flora    Goal of Therapy:  levofloxacin per indication and renal function  Plan:  - levofloxacin 500mg  PO q48h - follow-up clinical course, culture results, renal function - follow-up antibiotic de-escalation and length of therapy  Thank you for the consult.  Currie Paris, PharmD, BCPS Pager: [phone] Pharmacy: [phone] 05/23/2014 2:41 PM

## 2014-05-23 NOTE — Progress Notes (Signed)
Patient ID: Edward Nunez, male   DOB: 11-29-1925, 80 y.o.   MRN: [id number] [facility] PROGRESS NOTE  Edward Nunez MVE:[id number] DOB: 1926/04/29 DOA: 05/17/2014 PCP: [name], MD  Brief narrative: Pt is 80 yo male with HTN, HLD, prostate cancer but on no active treatment, CKD stage II - III, COPD, severe combined systolic and diastolic CHF (EF 28%), presented to [facility] ED with main concern of progressively worsening shortness of breath, worse with exertion and somewhat better with resting but no other alleviating factors. Pt denied fevers, chills, chest pain.  In ED, pt noted to be hemodynamically stable but hypoxic with oxygen saturations in mid 80's or RA (please note he is not on oxygen at home). BMP notable for Cr 1.49. TRH asked to admit to SDU for further evaluation and management.   Assessment & Plan   Principal Problem:  Acute respiratory failure with hypoxia  Likely combination of community acquired pneumonia, acute COPD exacerbation and acute on chronic diastolic CHF  Respiratory status remains stable. Continue azithromycin and rocephin for treatment of pneumonia for total 7 days, will now start po Levaquin.[name] Respiratory culture grew oropharyngeal flora. Legionella and strep pneumonia results negative.  Continue duoneb every 6 hours scheduled and every 2 hours PRN shortness of breath or wheezing for COPD exacerbation. On  PO steroids. Given 2 boluses of IV lasix since admission on admission. Cardio consulted and appreciate their recommendations. Oxygen support via [place] to keep O2 saturation above 90%   Lung mass CXR shows mass like opacity in the right apex, pulmonary has seen the patient. [facility].  Active Problems:  Tachy-brady / irregular hear rate   Rate controlled this am. Per cardio, started Zebeta 2.5 mg PO daily and Bidil TID COPD (chronic obstructive pulmonary disease)  Continue duoneb every 6 hours scheduled  and every 2 hours PRN. Continue solumedrol 40 mg IV daily for next 24 hours.  Continue abx for pneumonia, azithromycin and rocephin.  Oxygen support via [place] to keep O2 saturation above 90%  COPD gold alert order placed   Community acquired pneumonia  Completed  azithromycin and rocephin for total 7 days. Blood cultures, resp culture grew oropharyngeal flora,  Will start levaquin per pharmacy consult  Systolic and diastolic CHF, acute on chronic  [place] to [address],   Started zebeta, Bidil Will start IV Lasix 20 mg every 12 hours Replace electrolytes as needed   HTN (hypertension)  Hold fosinopril and hctz  BP on admission on lower side so held antihypertensives.  Appreciate cardio input on BP meds.   Hyperlipidemia  Continue statin therapy   Chronic kidney disease, stage IV  Has had elevated creatinine 1.5 in 2013. This admission creatinine lower than baseline value   Severe protein calorie malnutrition  In the context of chronic illness  Nutrition consulted    DVT Prophylaxis   Lovenox subQ while pt is in hospital    Code Status: Full.  Family Communication: plan of care discussed with the patient  Disposition Plan: transfer to telemetry; order placed..    IV Access:   Peripheral IV Procedures and diagnostic studies:   Dg Chest 2 View 05/17/2014 IMPRESSION: Patchy parenchymal disease throughout the right lung and probably involving the left lower lung. Findings are suggestive for multi-focal pneumonia. Probable small effusions. Persistent density near the right lung apex is nonspecific. Recommend follow-up chest radiographs to ensure resolution.  Medical Consultants:   Cardiology  Other Consultants:  Nutrition Physical therapy  Anti-Infectives:   Azithromycin 05/17/2014 --> 05/23/14 Rocephin 05/17/2014 --> 05/23/14   Oswald Hillock, MD  [facility] Pager [phone]  If 7PM-7AM, please contact night-coverage [email or web address] Password [facility] 05/23/2014, 2:03  PM   LOS: 6 days    HPI/Subjective: Patient seen and examined, breathing has now improved, was started on IV lasix 20 mg q  12 hr.  Objective: Filed Vitals:   05/22/14 2113 05/23/14 0430 05/23/14 0827 05/23/14 1232  BP: 132/76 130/82    Pulse: 108 86    Temp: 97.6 F (36.4 C) 98 F (36.7 C)    TempSrc: Oral Oral    Resp: 20 19    Height:      Weight:  64 kg (141 lb 1.5 oz)    SpO2: 95% 100% 96% 97%    Intake/Output Summary (Last 24 hours) at 05/23/14 1403 Last data filed at 05/23/14 0800  Gross per 24 hour  Intake    740 ml  Output    530 ml  Net    210 ml    Exam:  Physical Exam: Head: Normocephalic, atraumatic.  Neck: supple,No deformities, masses, or tenderness noted. Lungs: Tachypenic, bibasilar crackles R> L Heart: Regular RR. S1 and S2 normal  Abdomen: BS normoactive. Soft, Nondistended, non-tender.  Extremities: No pretibial edema, no erythema   Data Reviewed: Basic Metabolic Panel:  Recent Labs Lab 05/17/14 0902 05/17/14 1315  05/19/14 0348 05/20/14 0444 05/21/14 0446 05/22/14 0407 05/23/14 0505  NA 140 139  < > 141 144 147 143 146  K 3.7 3.6*  < > 3.9 4.1 4.0 4.0 4.0  CL 103 100  < > 104 106 110 106 106  CO2 21 25  < > 25 26 27 26 30   GLUCOSE 134* 105*  < > 93 113* 98 106* 95  BUN 17 17  < > 31* 28* 27* 26* 28*  CREATININE 1.49* 1.36*  < > 1.43* 1.24 1.21 1.14 1.35  CALCIUM 9.5 9.7  < > 9.8 10.1 9.8 9.9 10.0  MG  --  1.5  --   --   --   --   --   --   PHOS  --  2.8  --   --   --   --   --   --   < > = values in this interval not displayed. Liver Function Tests:  Recent Labs Lab 05/17/14 0902 05/17/14 1315 05/18/14 0400  AST 58* 43* 17  ALT 38 37 22  ALKPHOS 141* 155* 118*  BILITOT 0.4 0.4 0.2*  PROT 7.3 8.3 7.0  ALBUMIN 2.4* 2.9* 2.5*   No results found for this basename: LIPASE, AMYLASE,  in the last 168 hours No results found for this basename: AMMONIA,  in the last 168 hours CBC:  Recent Labs Lab 05/17/14 0902  05/17/14 1315  05/19/14 0348 05/20/14 0444 05/21/14 0446 05/22/14 0407 05/23/14 0505  WBC 7.4 5.8  < > 7.8 8.4 7.2 8.0 7.3  NEUTROABS 6.0 5.0  --   --   --   --  6.0  --   HGB 10.6* 12.6*  < > 10.8* 10.8* 9.9* 10.1* 10.6*  HCT 31.9* 35.9*  < > 31.3* 31.8* 29.2* 30.2* 31.5*  MCV 83.7 81.4  < > 81.5 81.3 81.8 82.1 81.6  PLT 268 271  < > 298 310 277 274 302  < > = values in this interval not displayed. Cardiac Enzymes:  Recent Labs Lab 05/17/14 0902  TROPONINI <  0.30   BNP: No components found with this basename: POCBNP,  CBG:  Recent Labs Lab 05/19/14 0752 05/20/14 0740 05/21/14 0747 05/22/14 0731 05/23/14 0756  GLUCAP 98 103* 92 107* 99    URINE CULTURE     Status: None   Collection Time    05/17/14 12:17 PM      Result Value Ref Range Status   Specimen Description URINE, CLEAN CATCH   Final   Value: NO GROWTH     Performed at [facility]   Report Status 05/18/2014 FINAL   Final  MRSA PCR SCREENING     Status: None   Collection Time    05/17/14 12:45 PM      Result Value Ref Range Status   MRSA by PCR NEGATIVE  NEGATIVE Final  CULTURE, RESPIRATORY (NON-EXPECTORATED)     Status: None   Collection Time    05/17/14  3:30 PM      Result Value Ref Range Status   Specimen Description SPUTUM   Final   Special Requests NONE   Final   Gram Stain     Final   Value: RARE WBC PRESENT, PREDOMINANTLY PMN     FEW SQUAMOUS EPITHELIAL CELLS PRESENT     FEW GRAM POSITIVE COCCI     IN PAIRS IN CLUSTERS RARE GRAM POSITIVE RODS     RARE GRAM NEGATIVE RODS   Culture     Final   Value: NO GROWTH     Performed at [facility]   Report Status PENDING   Incomplete     Scheduled Meds: . allopurinol  300 mg Oral Daily  . aspirin EC  81 mg Oral Daily  . atorvastatin  20 mg Oral q1800  . azithromycin  500 mg Intravenous Q24H  . bisoprolol  2.5 mg Oral Daily  . cefTRIAXone   1 g Intravenous Q24H  . enoxaparin (LOVENOX)   30 mg Subcutaneous Q24H  . feeding  supplement   237 mL Oral TID BM  . ipratropium-albuterol  3 mL Nebulization QID  . isosorbide-hydrALAZINE  0.5 tablet Oral TID  . methylPREDNISolone   40 mg Intravenous Q24H  . zolpidem  5 mg Oral QHS

## 2014-05-24 LAB — CBC
HEMATOCRIT: 32.5 % — AB (ref 39.0–52.0)
Hemoglobin: 10.7 g/dL — ABNORMAL LOW (ref 13.0–17.0)
MCH: 27.3 pg (ref 26.0–34.0)
MCHC: 32.9 g/dL (ref 30.0–36.0)
MCV: 82.9 fL (ref 78.0–100.0)
Platelets: 299 10*3/uL (ref 150–400)
RBC: 3.92 MIL/uL — AB (ref 4.22–5.81)
RDW: 15.1 % (ref 11.5–15.5)
WBC: 7.3 10*3/uL (ref 4.0–10.5)

## 2014-05-24 LAB — BASIC METABOLIC PANEL
Anion gap: 10 (ref 5–15)
BUN: 34 mg/dL — ABNORMAL HIGH (ref 6–23)
CALCIUM: 9.7 mg/dL (ref 8.4–10.5)
CO2: 29 meq/L (ref 19–32)
Chloride: 103 mEq/L (ref 96–112)
Creatinine, Ser: 1.48 mg/dL — ABNORMAL HIGH (ref 0.50–1.35)
GFR calc Af Amer: 48 mL/min — ABNORMAL LOW (ref >90)
GFR calc non Af Amer: 41 mL/min — ABNORMAL LOW (ref >90)
GLUCOSE: 108 mg/dL — AB (ref 70–99)
Potassium: 3.6 mEq/L — ABNORMAL LOW (ref 3.7–5.3)
SODIUM: 142 meq/L (ref 137–147)

## 2014-05-24 LAB — GLUCOSE, CAPILLARY: Glucose-Capillary: 99 mg/dL (ref 70–99)

## 2014-05-24 MED ORDER — FUROSEMIDE 40 MG PO TABS
40.0000 mg | ORAL_TABLET | Freq: Every day | ORAL | Status: DC
  Filled 2014-05-24: qty 1

## 2014-05-24 MED ORDER — LEVOFLOXACIN 750 MG PO TABS
750.0000 mg | ORAL_TABLET | ORAL | Status: DC
  Administered 2014-05-24 – 2014-05-26 (×2): 750 mg via ORAL
  Filled 2014-05-24 (×2): qty 1

## 2014-05-24 NOTE — Progress Notes (Signed)
ANTIBIOTIC CONSULT NOTE   Pharmacy Consult for levofloxacin Indication: pneumonia and acute COPD exacerbation  No Known Allergies  Patient Measurements: Height: 6' (182.9 cm) Weight: 140 lb 10.5 oz (63.8 kg) IBW/kg (Calculated) : 77.6  Vital Signs: Temp: 98.9 F (37.2 C) (08/16 0536) Temp src: Oral (08/16 0536) BP: 133/88 mmHg (08/16 0536) Pulse Rate: 85 (08/16 0536) Intake/Output from previous day: 08/15 0701 - 08/16 0700 In: 480 [P.O.:480] Out: 1625 [Urine:1625] Intake/Output from this shift: Total I/O In: 236 [P.O.:236] Out: -   Labs:  Recent Labs  05/22/14 0407 05/23/14 0505 05/24/14 0516  WBC 8.0 7.3 7.3  HGB 10.1* 10.6* 10.7*  PLT 274 302 299  CREATININE 1.14 1.35 1.48*   Estimated Creatinine Clearance: 32.9 ml/min (by C-G formula based on Cr of 1.48).   Medical History: Past Medical History  Diagnosis Date  . Hyperlipidemia   . Hypertension   . CHF (congestive heart failure)   . COPD (chronic obstructive pulmonary disease)   . Cancer     prostate  . Postsurgical percutaneous transluminal coronary angioplasty (PTCA) status   . S/P coronary artery stent placement     Medications:  Scheduled:  . allopurinol  300 mg Oral Daily  . antiseptic oral rinse  7 mL Mouth Rinse BID  . aspirin EC  81 mg Oral Daily  . atorvastatin  20 mg Oral q1800  . bisoprolol  5 mg Oral Daily  . cefTRIAXone (ROCEPHIN)  IV  1 g Intravenous Q24H  . enoxaparin (LOVENOX) injection  40 mg Subcutaneous Q24H  . feeding supplement (ENSURE COMPLETE)  237 mL Oral TID BM  . furosemide  20 mg Intravenous Q12H  . ipratropium-albuterol  3 mL Nebulization QID  . isosorbide-hydrALAZINE  1 tablet Oral TID  . levofloxacin  750 mg Oral Q48H  . [START ON 05/25/2014] predniSONE  20 mg Oral Q breakfast   Followed by  . [START ON 05/26/2014] predniSONE  10 mg Oral Q breakfast   Followed by  . [START ON 05/27/2014] predniSONE  5 mg Oral Q breakfast  . sodium chloride  3 mL Intravenous Q12H   . zolpidem  5 mg Oral QHS   Assessment: 57 yoM admitted 05/17/14 with acute respiratory failure due to suspected community-acquired pneumonia, acute COPD exacerbation, and acute on chronic CHF. Patient will complete 7 days of ceftriaxone and azithromycin for CAP treatment today. Patient remains on 3L nasal cannula. MD has requested Pharmacy to dose levofloxacin PO for continued treatment.  8/9 >> azithromycin >> 8/13 8/9 >> ceftriaxone >> 8/15 8/16 >> levofloxacin >>  Tmax: afebrile WBCs: WNL Renal: SCr 1.48 (CKD3 baseline SCr 1.2-1.5), CrCl 33 ml/min CG  8/9 MRSA PCR: negative 8/9 urine: NGF  8/9 sputum: normal oropharyngeal flora  8/9: influenza panel: neg 8/9: legionella and S. Pneumoniae Ags: both negative   Goal of Therapy:  levofloxacin per indication and renal function  Plan:  D1 Levofloxacin/D8 total antibiotics - levofloxacin 750mg  PO q48h (start 24h after last ceftriaxone dose) - follow-up antibiotic length of therapy  Thank you for the consult.  Doreene Eland, PharmD, BCPS.   Pager: [phone] 05/24/2014 12:04 PM

## 2014-05-24 NOTE — Progress Notes (Signed)
Patient ID: Edward Nunez, male   DOB: [birth year], 80 y.o.   MRN: [id number] [facility] PROGRESS NOTE  Edward Nunez:[id number] DOB: 10-11-1925 DOA: 05/17/2014 PCP: [name], MD  Brief narrative: Pt is 80 yo male with HTN, HLD, prostate cancer but on no active treatment, CKD stage II - III, COPD, severe combined systolic and diastolic CHF (EF 24%), presented to [facility] ED with main concern of progressively worsening shortness of breath, worse with exertion and somewhat better with resting but no other alleviating factors. Pt denied fevers, chills, chest pain.  In ED, pt noted to be hemodynamically stable but hypoxic with oxygen saturations in mid 80's or RA (please note he is not on oxygen at home). BMP notable for Cr 1.49. TRH asked to admit to SDU for further evaluation and management.   Assessment & Plan   Principal Problem:  Acute respiratory failure with hypoxia  Likely combination of community acquired pneumonia, acute COPD exacerbation and acute on chronic diastolic CHF  Respiratory status remains stable. Continue azithromycin and rocephin for treatment of pneumonia for total 7 days, will now start po Levaquin.[name] Respiratory culture grew oropharyngeal flora. Legionella and strep pneumonia results negative.  Continue duoneb every 6 hours scheduled and every 2 hours PRN shortness of breath or wheezing for COPD exacerbation. On  PO steroids.   Lung mass CXR shows mass like opacity in the right apex, pulmonary has seen the patient. [facility].  Active Problems:  Tachy-brady / irregular hear rate   Rate controlled this am. Per cardio, started Zebeta 2.5 mg PO daily and Bidil TID COPD (chronic obstructive pulmonary disease)  Continue duoneb every 6 hours scheduled and every 2 hours PRN. Continue solumedrol 40 mg IV daily for next 24 hours.  Continue abx for pneumonia, azithromycin and rocephin.  Oxygen support via [place] to keep O2  saturation above 90%  COPD gold alert order placed   Community acquired pneumonia  Completed  azithromycin and rocephin for total 7 days. Blood cultures, resp culture grew oropharyngeal flora,  Will start levaquin per pharmacy consult  Systolic and diastolic CHF, acute on chronic  [place] [address],   Started zebeta, Bidil Will change Lasix to by mouth 40 mg daily. Replace electrolytes as needed  Digoxin has been discontinued by cardiology  HTN (hypertension)  Hold fosinopril and hctz  BP on admission on lower side so held antihypertensives.  Appreciate cardio input on BP meds.   Hyperlipidemia  Continue statin therapy   Chronic kidney disease, stage IV  Has had elevated creatinine 1.5 in 2013. This admission creatinine lower than baseline value   Severe protein calorie malnutrition  In the context of chronic illness  Nutrition consulted    DVT Prophylaxis   Lovenox subQ while pt is in hospital    Code Status: Full.  Family Communication: plan of care discussed with the patient  Disposition Plan: transfer to telemetry; order placed..    IV Access:   Peripheral IV Procedures and diagnostic studies:   Dg Chest 2 View 05/17/2014 IMPRESSION: Patchy parenchymal disease throughout the right lung and probably involving the left lower lung. Findings are suggestive for multi-focal pneumonia. Probable small effusions. Persistent density near the right lung apex is nonspecific. Recommend follow-up chest radiographs to ensure resolution.  Medical Consultants:   Cardiology  Other Consultants:   Nutrition Physical therapy  Anti-Infectives:   Azithromycin 05/17/2014 --> 05/23/14 Rocephin 05/17/2014 --> 05/23/14 Levaquin 8/16>>  Edward Hillock, MD  [facility] Pager [phone]  If 7PM-7AM, please contact night-coverage [email or web address] Password [email or web address] 05/24/2014, 2:30 PM   LOS: 7 days    HPI/Subjective: Patient seen and examined, breathing has now improved, was  started on IV lasix 20 mg q  12 hr.  Objective: Filed Vitals:   05/24/14 0536 05/24/14 0949 05/24/14 1208 05/24/14 1359  BP: 133/88   103/55  Pulse: 85   87  Temp: 98.9 F (37.2 C)   97.8 F (36.6 C)  TempSrc: Oral   Oral  Resp: 20   22  Height:      Weight: 63.8 kg (140 lb 10.5 oz)     SpO2: 98% 98% 94% 95%    Intake/Output Summary (Last 24 hours) at 05/24/14 1430 Last data filed at 05/24/14 1402  Gross per 24 hour  Intake    476 ml  Output   2125 ml  Net  -1649 ml    Exam:  Physical Exam: Head: Normocephalic, atraumatic.  Neck: supple,No deformities, masses, or tenderness noted. Lungs: Tachypenic, bibasilar crackles R> L Heart: Regular RR. S1 and S2 normal  Abdomen: BS normoactive. Soft, Nondistended, non-tender.  Extremities: No pretibial edema, no erythema   Data Reviewed: Basic Metabolic Panel:  Recent Labs Lab 05/20/14 0444 05/21/14 0446 05/22/14 0407 05/23/14 0505 05/24/14 0516  NA 144 147 143 146 142  K 4.1 4.0 4.0 4.0 3.6*  CL 106 110 106 106 103  CO2 26 27 26 30 29   GLUCOSE 113* 98 106* 95 108*  BUN 28* 27* 26* 28* 34*  CREATININE 1.24 1.21 1.14 1.35 1.48*  CALCIUM 10.1 9.8 9.9 10.0 9.7   Liver Function Tests:  Recent Labs Lab 05/18/14 0400  AST 17  ALT 22  ALKPHOS 118*  BILITOT 0.2*  PROT 7.0  ALBUMIN 2.5*   No results found for this basename: LIPASE, AMYLASE,  in the last 168 hours No results found for this basename: AMMONIA,  in the last 168 hours CBC:  Recent Labs Lab 05/20/14 0444 05/21/14 0446 05/22/14 0407 05/23/14 0505 05/24/14 0516  WBC 8.4 7.2 8.0 7.3 7.3  NEUTROABS  --   --  6.0  --   --   HGB 10.8* 9.9* 10.1* 10.6* 10.7*  HCT 31.8* 29.2* 30.2* 31.5* 32.5*  MCV 81.3 81.8 82.1 81.6 82.9  PLT 310 277 274 302 299   Cardiac Enzymes: No results found for this basename: CKTOTAL, CKMB, CKMBINDEX, TROPONINI,  in the last 168 hours BNP: No components found with this basename: POCBNP,  CBG:  Recent Labs Lab  05/20/14 0740 05/21/14 0747 05/22/14 0731 05/23/14 0756 05/24/14 0815  GLUCAP 103* 92 107* 99 99    URINE CULTURE     Status: None   Collection Time    05/17/14 12:17 PM      Result Value Ref Range Status   Specimen Description URINE, CLEAN CATCH   Final   Value: NO GROWTH     Performed at [facility]   Report Status 05/18/2014 FINAL   Final  MRSA PCR SCREENING     Status: None   Collection Time    05/17/14 12:45 PM      Result Value Ref Range Status   MRSA by PCR NEGATIVE  NEGATIVE Final  CULTURE, RESPIRATORY (NON-EXPECTORATED)     Status: None   Collection Time    05/17/14  3:30 PM      Result Value Ref Range Status   Specimen Description SPUTUM  Final   Special Requests NONE   Final   Gram Stain     Final   Value: RARE WBC PRESENT, PREDOMINANTLY PMN     FEW SQUAMOUS EPITHELIAL CELLS PRESENT     FEW GRAM POSITIVE COCCI     IN PAIRS IN CLUSTERS RARE GRAM POSITIVE RODS     RARE GRAM NEGATIVE RODS   Culture     Final   Value: NO GROWTH     Performed at [facility]   Report Status PENDING   Incomplete     Scheduled Meds: . allopurinol  300 mg Oral Daily  . aspirin EC  81 mg Oral Daily  . atorvastatin  20 mg Oral q1800  . azithromycin  500 mg Intravenous Q24H  . bisoprolol  2.5 mg Oral Daily  . cefTRIAXone   1 g Intravenous Q24H  . enoxaparin (LOVENOX)   30 mg Subcutaneous Q24H  . feeding supplement   237 mL Oral TID BM  . ipratropium-albuterol  3 mL Nebulization QID  . isosorbide-hydrALAZINE  0.5 tablet Oral TID  . methylPREDNISolone   40 mg Intravenous Q24H  . zolpidem  5 mg Oral QHS

## 2014-05-24 NOTE — Progress Notes (Signed)
Patient Profile: 80 yo with history of chronic systolic CHF and probable ischemic cardiomyopathy as well as COPD and CKD was admitted with dyspnea and hypoxia.    Subjective:   He is feeling much better. He was in the [employer] and [employer].  Goes to the [place] and [place] .   Has been a Administrator.    Objective:   Vital Signs in the last 24 hours: Temp:  [98 F (36.7 C)-98.9 F (37.2 C)] 98.9 F (37.2 C) (08/16 0536) Pulse Rate:  [79-89] 85 (08/16 0536) Resp:  [18-20] 20 (08/16 0536) BP: (132-143)/(61-88) 133/88 mmHg (08/16 0536) SpO2:  [91 %-98 %] 98 % (08/16 0536) Weight:  [140 lb 10.5 oz (63.8 kg)] 140 lb 10.5 oz (63.8 kg) (08/16 0536)  Intake/Output from previous day: -360 08/15 0701 - 08/16 0700 In: 480 [P.O.:480] Out: 1625 [Urine:1625]  I/O since admission: -2105  Medications: . allopurinol  300 mg Oral Daily  . antiseptic oral rinse  7 mL Mouth Rinse BID  . aspirin EC  81 mg Oral Daily  . atorvastatin  20 mg Oral q1800  . bisoprolol  5 mg Oral Daily  . cefTRIAXone (ROCEPHIN)  IV  1 g Intravenous Q24H  . enoxaparin (LOVENOX) injection  40 mg Subcutaneous Q24H  . feeding supplement (ENSURE COMPLETE)  237 mL Oral TID BM  . furosemide  20 mg Intravenous Q12H  . ipratropium-albuterol  3 mL Nebulization QID  . isosorbide-hydrALAZINE  1 tablet Oral TID  . predniSONE  30 mg Oral Q breakfast   Followed by  . [START ON 05/25/2014] predniSONE  20 mg Oral Q breakfast   Followed by  . [START ON 05/26/2014] predniSONE  10 mg Oral Q breakfast   Followed by  . [START ON 05/27/2014] predniSONE  5 mg Oral Q breakfast  . sodium chloride  3 mL Intravenous Q12H  . zolpidem  5 mg Oral QHS       Physical Exam:   General appearance: alert, cooperative Neck: no adenopathy, no carotid bruit, no JVD, supple, symmetrical, trachea midline and thyroid not enlarged, symmetric, no tenderness/mass/nodules Lungs:  ; no wheezing Heart: regular rate and rhythm and  1/0 sem Abdomen: soft, non-tender; bowel sounds normal; no masses,  no organomegaly Extremities: no edema, redness or tenderness in the calves or thighs Neurologic: Grossly normal   Rate: 95  Rhythm: Sinus  Lab Results:   Recent Labs  05/23/14 0505 05/24/14 0516  NA 146 142  K 4.0 3.6*  CL 106 103  CO2 30 29  GLUCOSE 95 108*  BUN 28* 34*  CREATININE 1.35 1.48*   CBC    Component Value Date/Time   WBC 7.3 05/24/2014 0516   WBC 5.3 12/28/2013 0930   RBC 3.92* 05/24/2014 0516   RBC 5.02 12/28/2013 0930   HGB 10.7* 05/24/2014 0516   HGB 13.6* 12/28/2013 0930   HCT 32.5* 05/24/2014 0516   HCT 42.8* 12/28/2013 0930   PLT 299 05/24/2014 0516   MCV 82.9 05/24/2014 0516   MCV 85.3 12/28/2013 0930   MCH 27.3 05/24/2014 0516   MCH 27.1 12/28/2013 0930   MCHC 32.9 05/24/2014 0516   MCHC 31.8 12/28/2013 0930   RDW 15.1 05/24/2014 0516   LYMPHSABS 1.0 05/22/2014 0407   MONOABS 0.7 05/22/2014 0407   EOSABS 0.2 05/22/2014 0407   BASOSABS 0.0 05/22/2014 0407    CBC Latest Ref Rng 05/24/2014 05/23/2014 05/22/2014  WBC 4.0 - 10.5 K/uL 7.3  7.3 8.0  Hemoglobin 13.0 - 17.0 g/dL 10.7(L) 10.6(L) 10.1(L)  Hematocrit 39.0 - 52.0 % 32.5(L) 31.5(L) 30.2(L)  Platelets 150 - 400 K/uL 299 302 274    No results found for this basename: TROPONINI, CK, MB,  in the last 72 hours  Hepatic Function Panel No results found for this basename: PROT, ALBUMIN, AST, ALT, ALKPHOS, BILITOT, BILIDIR, IBILI,  in the last 72 hours No results found for this basename: INR,  in the last 72 hours BNP (last 3 results)  Recent Labs  05/17/14 0902 05/22/14 0407  PROBNP 10162.0* 15127.0*    Lipid Panel     Component Value Date/Time   CHOL 290* 12/28/2013 0921   TRIG 120 12/28/2013 0921   HDL 56 12/28/2013 0921   CHOLHDL 5.2 12/28/2013 0921   VLDL 24 12/28/2013 0921   LDLCALC 210* 12/28/2013 0921      Imaging:  No results found.    Assessment/Plan:   Principal Problem:   Acute respiratory failure with  hypoxia Active Problems:   HTN (hypertension)   Hyperlipidemia   COPD (chronic obstructive pulmonary disease)   Community acquired pneumonia   Acute renal failure   Systolic and diastolic CHF, acute on chronic   CKD (chronic kidney disease) stage 4, GFR 15-29 ml/min   Protein-calorie malnutrition, severe   Lung mass  1. Community-acquired PNA: Patchy parenchymal disease throughout the right lung and probably involving the left lower lung. Findings are suggestive for multi-focal pneumonia. F/U  CXR with mild left and severe right airspace disease suggesting multifocal pneumonia. There has been minimal improvement on the right.  Again seen is a more masslike opacity in the right apex. Antibiotics per primary service. Pt seen by pulmonary service yesterday. 2. COPD exacerbation: He is being treated with nebs and steroids. No wheezing today.  3. CHF: Acute/chronic systolic CHF, EF 25%. Now on bidil 1 tid . Now on  bisoprolol in place of Coreg for improved cardioselectivity,  titrated to 5 mg yesterday. Not on ACE-I with recent renal insuff. BNP elevated    lanoxin at 0.0625 mg daily. - Has wide QRS, LBBB. Given age, not ICD candidate but could consider CRT for symptom control.  He sees a cardiologist in the [place] system as well. He may be able to go home and be followed as OP. He can follow up with Dr. Aundra Dubin at [facility] if he wants to follow up here.  Otherwise, he may want to go to the [place].  4. Renal insufficiency: Cr has gone back up slightly.  Agree with changing IV lasix to po. Will DC digoxin given his age and renal insufficiency 5. Hyperlipidemia; Chol 290, LDL 210 from earlier in year; will increase atorvastain to 40 mg for more aggressive treatment. 6. Anemia; H/H 10.1/30.2   Thayer Headings, Brooke Bonito., MD, [facility] 05/24/2014, 8:22 AM [address]  [place] Pager [phone]

## 2014-05-25 LAB — CBC
HCT: 30.1 % — ABNORMAL LOW (ref 39.0–52.0)
Hemoglobin: 10.2 g/dL — ABNORMAL LOW (ref 13.0–17.0)
MCH: 27.6 pg (ref 26.0–34.0)
MCHC: 33.9 g/dL (ref 30.0–36.0)
MCV: 81.6 fL (ref 78.0–100.0)
Platelets: 294 10*3/uL (ref 150–400)
RBC: 3.69 MIL/uL — AB (ref 4.22–5.81)
RDW: 14.8 % (ref 11.5–15.5)
WBC: 8.6 10*3/uL (ref 4.0–10.5)

## 2014-05-25 LAB — BASIC METABOLIC PANEL
Anion gap: 10 (ref 5–15)
BUN: 41 mg/dL — ABNORMAL HIGH (ref 6–23)
CHLORIDE: 103 meq/L (ref 96–112)
CO2: 29 mEq/L (ref 19–32)
Calcium: 9.5 mg/dL (ref 8.4–10.5)
Creatinine, Ser: 1.62 mg/dL — ABNORMAL HIGH (ref 0.50–1.35)
GFR calc non Af Amer: 37 mL/min — ABNORMAL LOW (ref >90)
GFR, EST AFRICAN AMERICAN: 43 mL/min — AB (ref >90)
GLUCOSE: 106 mg/dL — AB (ref 70–99)
Potassium: 4.2 mEq/L (ref 3.7–5.3)
Sodium: 142 mEq/L (ref 137–147)

## 2014-05-25 LAB — GLUCOSE, CAPILLARY: GLUCOSE-CAPILLARY: 96 mg/dL (ref 70–99)

## 2014-05-25 MED ORDER — IPRATROPIUM-ALBUTEROL 0.5-2.5 (3) MG/3ML IN SOLN
3.0000 mL | RESPIRATORY_TRACT | Status: DC | PRN
  Administered 2014-05-25: 3 mL via RESPIRATORY_TRACT
  Filled 2014-05-25: qty 3

## 2014-05-25 NOTE — Progress Notes (Signed)
Physical Therapy Treatment Patient Details Name: Bain Whichard MRN: [id number] DOB: 12/22/1925 Today's Date: 05/25/2014    History of Present Illness 80 yo with dyspnea and hypoxia, pna ; PMHx: chronic systolic CHF and probable ischemic cardiomyopathy, COPD, CKD, EF 20%    PT Comments    **Significant progress with activity tolerance compared to last visit, pt walked total of 260' (130' x 2 with seated rest break) with 4L O2. SaO2 81% on RA walking, 93% on 4L walking. *  Follow Up Recommendations  Home health PT     Equipment Recommendations  Cane;Other (comment) (oxygen)    Recommendations for Other Services       Precautions / Restrictions Precautions Precautions: Fall Precaution Comments: monitor O2 Restrictions Weight Bearing Restrictions: No    Mobility  Bed Mobility Overal bed mobility: Modified Independent Bed Mobility: Supine to Sit     Supine to sit: Modified independent (Device/Increase time)        Transfers Overall transfer level: Modified independent Equipment used: None Transfers: Sit to/from Stand Sit to Stand: Modified independent (Device/Increase time)         General transfer comment: pushed up from armrests  Ambulation/Gait   Ambulation Distance (Feet): 260 Feet (130' x 2, with seated rest break between trials) Assistive device:  (held onto O2 cart with one hand) Gait Pattern/deviations: WFL(Within Functional Limits)   Gait velocity interpretation: at or above normal speed for age/gender General Gait Details: slow gait speed. 1 seated rest break needed 2* SOB, SaO2 82% on RA walking, 93% on 4L O2   Stairs            Wheelchair Mobility    Modified Rankin (Stroke Patients Only)       Balance             Standing balance-Leahy Scale: Fair                      Cognition Arousal/Alertness: Awake/alert Behavior During Therapy: WFL for tasks assessed/performed Overall Cognitive Status: Within Functional  Limits for tasks assessed                      Exercises      General Comments        Pertinent Vitals/Pain Pain Assessment: No/denies pain    Home Living                      Prior Function            PT Goals (current goals can now be found in the care plan section) Acute Rehab PT Goals Patient Stated Goal: get back to being independent/get strength back PT Goal Formulation: With patient Time For Goal Achievement: 06/08/14 Potential to Achieve Goals: Good Progress towards PT goals: Progressing toward goals    Frequency  Min 3X/week    PT Plan Current plan remains appropriate    Co-evaluation             End of Session Equipment Utilized During Treatment: Gait belt;Oxygen Activity Tolerance: Patient limited by fatigue Patient left: with call bell/phone within reach;in chair;with chair alarm set     Time: 1300-1325 PT Time Calculation (min): 25 min  Charges:  $Gait Training: 23-37 mins                    G Codes:      [name] 05/25/2014, 1:39 PM [phone]

## 2014-05-25 NOTE — Progress Notes (Signed)
Patient ID: Edward Nunez, male   DOB: [birth year], 80 y.o.   MRN: [id number] [facility] PROGRESS NOTE  Burwell Bethel ZJI:[id number] DOB: 05/14/1926 DOA: 05/17/2014 PCP: [name], MD  Brief narrative: Pt is 80 yo male with HTN, HLD, prostate cancer but on no active treatment, CKD stage II - III, COPD, severe combined systolic and diastolic CHF (EF 17%), presented to [facility] ED with main concern of progressively worsening shortness of breath, worse with exertion and somewhat better with resting but no other alleviating factors. Pt denied fevers, chills, chest pain.  In ED, pt noted to be hemodynamically stable but hypoxic with oxygen saturations in mid 80's or RA (please note he is not on oxygen at home). BMP notable for Cr 1.49. TRH asked to admit to SDU for further evaluation and management.   Assessment & Plan   Principal Problem:  Acute respiratory failure with hypoxia  Likely combination of community acquired pneumonia, acute COPD exacerbation and acute on chronic diastolic CHF  Respiratory status remains stable. Continue azithromycin and rocephin for treatment of pneumonia for total 7 days, will now start po Levaquin.[name] Respiratory culture grew oropharyngeal flora. Legionella and strep pneumonia results negative.  Continue duoneb every 6 hours scheduled and every 2 hours PRN shortness of breath or wheezing for COPD exacerbation. On  PO steroids. Given 2 boluses of IV lasix since admission on admission. Cardio consulted and appreciate their recommendations. Oxygen support via [place] to keep O2 saturation above 90%   Lung mass CXR shows mass like opacity in the right apex, pulmonary has seen the patient. [facility]  Active Problems:  Tachy-brady / irregular hear rate   Rate controlled this am. Per cardio, started Zebeta 2.5 mg PO daily and Bidil TID COPD (chronic obstructive pulmonary disease)  Continue duoneb every 6 hours scheduled  and every 2 hours PRN. Continue solumedrol 40 mg IV daily for next 24 hours.  Continue abx for pneumonia, azithromycin and rocephin.  Oxygen support via [place] to keep O2 saturation above 90%  COPD gold alert order placed   Community acquired pneumonia  Completed  azithromycin and rocephin for total 7 days. Blood cultures, resp culture grew oropharyngeal flora,  Will start levaquin per pharmacy consult  Systolic and diastolic CHF, acute on chronic  [place] [address],   Started zebeta, Bidil Will start IV Lasix 20 mg every 12 hours Replace electrolytes as needed   HTN (hypertension)  Hold fosinopril and hctz  BP on admission on lower side so held antihypertensives.  Appreciate cardio input on BP meds.   Hyperlipidemia  Continue statin therapy   Chronic kidney disease, stage IV  Has had elevated creatinine 1.5 in 2013. Cr slowly rising due to lasix. Will follow bmp in am.  Severe protein calorie malnutrition  In the context of chronic illness  Nutrition consulted    DVT Prophylaxis   Lovenox subQ while pt is in hospital    Code Status: Full.  Family Communication: plan of care discussed with the patient  Disposition Plan: transfer to telemetry; order placed..    IV Access:   Peripheral IV Procedures and diagnostic studies:   Dg Chest 2 View 05/17/2014 IMPRESSION: Patchy parenchymal disease throughout the right lung and probably involving the left lower lung. Findings are suggestive for multi-focal pneumonia. Probable small effusions. Persistent density near the right lung apex is nonspecific. Recommend follow-up chest radiographs to ensure resolution.  Medical Consultants:   Cardiology  Other Consultants:   Nutrition Physical therapy  Anti-Infectives:   Azithromycin 05/17/2014 --> 05/23/14 Rocephin 05/17/2014 --> 05/23/14   Oswald Hillock, MD  [facility] Pager [phone]  If 7PM-7AM, please contact night-coverage [email or web address] Password [facility] 05/25/2014, 11:31  AM   LOS: 8 days    HPI/Subjective: Patient seen and examined, breathing has now improved, was started on IV lasix 20 mg q  12 hr. Now switched to po  Lasix 40 mg daily.  Objective: Filed Vitals:   05/24/14 2154 05/25/14 0122 05/25/14 0523 05/25/14 0815  BP: 104/58  135/82   Pulse: 88  83   Temp: 97.4 F (36.3 C)  98 F (36.7 C)   TempSrc: Oral  Oral   Resp: 20  18   Height:      Weight:  64 kg (141 lb 1.5 oz)    SpO2: 96%  93% 94%    Intake/Output Summary (Last 24 hours) at 05/25/14 1131 Last data filed at 05/25/14 1044  Gross per 24 hour  Intake    240 ml  Output   1225 ml  Net   -985 ml    Exam:  Physical Exam: Head: Normocephalic, atraumatic.  Neck: supple,No deformities, masses, or tenderness noted. Lungs: Tachypenic, bibasilar crackles R> L Heart: Regular RR. S1 and S2 normal  Abdomen: BS normoactive. Soft, Nondistended, non-tender.  Extremities: No pretibial edema, no erythema   Data Reviewed: Basic Metabolic Panel:  Recent Labs Lab 05/21/14 0446 05/22/14 0407 05/23/14 0505 05/24/14 0516 05/25/14 0413  NA 147 143 146 142 142  K 4.0 4.0 4.0 3.6* 4.2  CL 110 106 106 103 103  CO2 27 26 30 29 29   GLUCOSE 98 106* 95 108* 106*  BUN 27* 26* 28* 34* 41*  CREATININE 1.21 1.14 1.35 1.48* 1.62*  CALCIUM 9.8 9.9 10.0 9.7 9.5   Liver Function Tests: No results found for this basename: AST, ALT, ALKPHOS, BILITOT, PROT, ALBUMIN,  in the last 168 hours No results found for this basename: LIPASE, AMYLASE,  in the last 168 hours No results found for this basename: AMMONIA,  in the last 168 hours CBC:  Recent Labs Lab 05/21/14 0446 05/22/14 0407 05/23/14 0505 05/24/14 0516 05/25/14 0413  WBC 7.2 8.0 7.3 7.3 8.6  NEUTROABS  --  6.0  --   --   --   HGB 9.9* 10.1* 10.6* 10.7* 10.2*  HCT 29.2* 30.2* 31.5* 32.5* 30.1*  MCV 81.8 82.1 81.6 82.9 81.6  PLT 277 274 302 299 294   Cardiac Enzymes: No results found for this basename: CKTOTAL, CKMB, CKMBINDEX,  TROPONINI,  in the last 168 hours BNP: No components found with this basename: POCBNP,  CBG:  Recent Labs Lab 05/21/14 0747 05/22/14 0731 05/23/14 0756 05/24/14 0815 05/25/14 0728  GLUCAP 92 107* 99 99 96    URINE CULTURE     Status: None   Collection Time    05/17/14 12:17 PM      Result Value Ref Range Status   Specimen Description URINE, CLEAN CATCH   Final   Value: NO GROWTH     Performed at [facility]   Report Status 05/18/2014 FINAL   Final  MRSA PCR SCREENING     Status: None   Collection Time    05/17/14 12:45 PM      Result Value Ref Range Status   MRSA by PCR NEGATIVE  NEGATIVE Final  CULTURE, RESPIRATORY (NON-EXPECTORATED)     Status: None   Collection Time  05/17/14  3:30 PM      Result Value Ref Range Status   Specimen Description SPUTUM   Final   Special Requests NONE   Final   Gram Stain     Final   Value: RARE WBC PRESENT, PREDOMINANTLY PMN     FEW SQUAMOUS EPITHELIAL CELLS PRESENT     FEW GRAM POSITIVE COCCI     IN PAIRS IN CLUSTERS RARE GRAM POSITIVE RODS     RARE GRAM NEGATIVE RODS   Culture     Final   Value: NO GROWTH     Performed at [facility]   Report Status PENDING   Incomplete     Scheduled Meds: . allopurinol  300 mg Oral Daily  . aspirin EC  81 mg Oral Daily  . atorvastatin  20 mg Oral q1800  . azithromycin  500 mg Intravenous Q24H  . bisoprolol  2.5 mg Oral Daily  . cefTRIAXone   1 g Intravenous Q24H  . enoxaparin (LOVENOX)   30 mg Subcutaneous Q24H  . feeding supplement   237 mL Oral TID BM  . ipratropium-albuterol  3 mL Nebulization QID  . isosorbide-hydrALAZINE  0.5 tablet Oral TID  . methylPREDNISolone   40 mg Intravenous Q24H  . zolpidem  5 mg Oral QHS

## 2014-05-25 NOTE — Progress Notes (Signed)
NUTRITION FOLLOW UP   INTERVENTION: - Continue Ensure Complete TID - Encouraged increased meal intake - Encouraged intake and continued use of Ensure once discharged. - RD to continue to monitor   NUTRITION DIAGNOSIS: Increased nutrient needs related to COPD and unintended weight loss as evidenced by MD notes, weight trend.   Goal: Pt to consume >90% of meals/supplements  Monitor:  Weights, labs, intake   Reason for Assessment: Consult for assessment, malnutrition screening tool   80 y.o. male  Admitting Dx: Acute respiratory failure with hypoxia  ASSESSMENT: Pt with HTN, HLD, prostate cancer but on no active treatment, CKD stage II - III, COPD, severe combined systolic and diastolic CHF (EF 73%), presented to [facility] ED with main concern of progressively worsening shortness of breath, worse with exertion and somewhat better with resting but no other alleviating factors.   8/10: - Pt reports on/off appetite and intake - Sometimes eats 1 small meal/day, other times 2-3 meals/day - Drinks Ensure on/off  - Said he's lost 35 pounds unintentionally in the past 4 years - Ate 100% of his breakfast this morning  - Pt reports he uses a lot of salt on his foods at home - discouraged this as pt with CHF and briefly discussed salt-free ways to season his foods   7/18: - Drinking Ensure - Intake good with 75-100% meals.  Patient reports variable intake overall related to appetite.  Height: Ht Readings from Last 1 Encounters:  05/17/14 6' (1.829 m)    Weight: Wt Readings from Last 1 Encounters:  05/25/14 141 lb 1.5 oz (64 kg)    Ideal Body Weight: 178 lbs   % Ideal Body Weight: 76%  Wt Readings from Last 10 Encounters:  05/25/14 141 lb 1.5 oz (64 kg)  01/21/14 144 lb 6.4 oz (65.499 kg)  12/28/13 154 lb (69.854 kg)  12/06/13 161 lb (73.029 kg)  04/28/13 164 lb 12.8 oz (74.753 kg)  12/09/12 164 lb (74.39 kg)  04/22/12 165 lb (74.844 kg)    Usual Body Weight: 171 lbs 4 years  ago per pt  % Usual Body Weight: 79%  BMI:  Body mass index is 19.13 kg/(m^2).  Estimated Nutritional Needs: Kcal: 2200-2400 Protein: 100-120g Fluid: 2.2-2.4L/day   Skin: intact   Diet Order: General  EDUCATION NEEDS: -No education needs identified at this time   Intake/Output Summary (Last 24 hours) at 05/25/14 1407 Last data filed at 05/25/14 1044  Gross per 24 hour  Intake    240 ml  Output    725 ml  Net   -485 ml    Last BM: PTA  Labs:   Recent Labs Lab 05/23/14 0505 05/24/14 0516 05/25/14 0413  NA 146 142 142  K 4.0 3.6* 4.2  CL 106 103 103  CO2 30 29 29   BUN 28* 34* 41*  CREATININE 1.35 1.48* 1.62*  CALCIUM 10.0 9.7 9.5  GLUCOSE 95 108* 106*    CBG (last 3)   Recent Labs  05/23/14 0756 05/24/14 0815 05/25/14 0728  GLUCAP 99 99 96    Scheduled Meds: . allopurinol  300 mg Oral Daily  . antiseptic oral rinse  7 mL Mouth Rinse BID  . aspirin EC  81 mg Oral Daily  . atorvastatin  20 mg Oral q1800  . bisoprolol  5 mg Oral Daily  . enoxaparin (LOVENOX) injection  40 mg Subcutaneous Q24H  . feeding supplement (ENSURE COMPLETE)  237 mL Oral TID BM  . isosorbide-hydrALAZINE  1 tablet Oral  TID  . levofloxacin  750 mg Oral Q48H  . [START ON 05/26/2014] predniSONE  10 mg Oral Q breakfast   Followed by  . [START ON 05/27/2014] predniSONE  5 mg Oral Q breakfast  . sodium chloride  3 mL Intravenous Q12H  . zolpidem  5 mg Oral QHS    Continuous Infusions:   Past Medical History  Diagnosis Date  . Hyperlipidemia   . Hypertension   . CHF (congestive heart failure)   . COPD (chronic obstructive pulmonary disease)   . Cancer     prostate  . Postsurgical percutaneous transluminal coronary angioplasty (PTCA) status   . S/P coronary artery stent placement     Past Surgical History  Procedure Laterality Date  . Radioactive seed implant      Antonieta Iba, RD, LDN Clinical Inpatient Dietitian Pager:  [phone] Weekend and after hours pager:   [phone]

## 2014-05-25 NOTE — Progress Notes (Signed)
Occupational Therapy Treatment Patient Details Name: Edward Nunez MRN: [id number] DOB: [birth year] Today's Date: 05/25/2014    History of present illness   ARF      Follow Up Recommendations  Home health OT    Equipment Recommendations  None recommended by OT       Precautions / Restrictions Precautions Precautions: Fall Precaution Comments: monitor O2 Restrictions Weight Bearing Restrictions: No       Mobility Bed Mobility Overal bed mobility: Modified Independent Bed Mobility: Supine to Sit     Supine to sit: Modified independent (Device/Increase time)        Transfers Overall transfer level: Needs assistance Equipment used: Straight cane   Sit to Stand: Supervision         General transfer comment: close guard for safety        ADL Overall ADL's : Needs assistance/impaired     Grooming: Wash/dry face;Sitting;Set up                   Toilet Transfer: Min guard;Ambulation;Regular Museum/gallery exhibitions officer and Hygiene: Min guard;Sit to/from stand                          Cognition   Behavior During Therapy: [facility] for tasks assessed/performed Overall Cognitive Status: Within Functional Limits for tasks assessed                               General Comments  Educated pt on energy conservation strategies with ADL activity .  Pt will need further education with a family member present      Frequency Min 2X/week     Progress Toward Goals  OT Goals(current goals can now be found in the care plan section)  Progress towards OT goals: Progressing toward goals  Acute Rehab OT Goals Patient Stated Goal: get back to being independent/get strength back         End of Session     Activity Tolerance Patient tolerated treatment well   Patient Left in chair;with call bell/phone within reach;with chair alarm set   Nurse Communication Mobility status        Time: 4503-8882 OT Time Calculation  (min): 24 min  Charges: OT General Charges $OT Visit: 1 Procedure OT Treatments $Self Care/Home Management : 23-37 mins  Dewon Mendizabal, Thereasa Parkin 05/25/2014, 1:29 PM

## 2014-05-25 NOTE — Progress Notes (Signed)
Patient Profile: 80 yo with history of chronic systolic CHF and probable ischemic cardiomyopathy as well as COPD and CKD was admitted with dyspnea and hypoxia.    Subjective:   He is feeling much better. He was in the [employer] and [employer].  Goes to the [employer] .   Has been a Administrator.    Objective:   Vital Signs in the last 24 hours: Temp:  [97.4 F (36.3 C)-98 F (36.7 C)] 98 F (36.7 C) (08/17 0523) Pulse Rate:  [83-88] 83 (08/17 0523) Resp:  [18-22] 18 (08/17 0523) BP: (103-135)/(55-82) 135/82 mmHg (08/17 0523) SpO2:  [93 %-99 %] 93 % (08/17 0523) Weight:  [141 lb 1.5 oz (64 kg)] 141 lb 1.5 oz (64 kg) (08/17 0122)  Intake/Output from previous day: -360 08/16 0701 - 08/17 0700 In: 236 [P.O.:236] Out: 1225 [Urine:1225]  I/O since admission: -2105  Medications: . allopurinol  300 mg Oral Daily  . antiseptic oral rinse  7 mL Mouth Rinse BID  . aspirin EC  81 mg Oral Daily  . atorvastatin  20 mg Oral q1800  . bisoprolol  5 mg Oral Daily  . enoxaparin (LOVENOX) injection  40 mg Subcutaneous Q24H  . feeding supplement (ENSURE COMPLETE)  237 mL Oral TID BM  . furosemide  40 mg Oral Daily  . ipratropium-albuterol  3 mL Nebulization QID  . isosorbide-hydrALAZINE  1 tablet Oral TID  . levofloxacin  750 mg Oral Q48H  . predniSONE  20 mg Oral Q breakfast   Followed by  . [START ON 05/26/2014] predniSONE  10 mg Oral Q breakfast   Followed by  . [START ON 05/27/2014] predniSONE  5 mg Oral Q breakfast  . sodium chloride  3 mL Intravenous Q12H  . zolpidem  5 mg Oral QHS       Physical Exam:   General appearance: alert, cooperative Neck: no adenopathy, no carotid bruit, no JVD, supple, symmetrical, trachea midline and thyroid not enlarged, symmetric, no tenderness/mass/nodules Lungs:  Coarse rales right base Heart: regular rate and rhythm and 1/0 sem Abdomen: soft, non-tender; bowel sounds normal; no masses,  no organomegaly Extremities:  no edema, redness or tenderness in the calves or thighs Neurologic: Grossly normal   Rate: 95  Rhythm: Sinus  Lab Results:   Recent Labs  05/24/14 0516 05/25/14 0413  NA 142 142  K 3.6* 4.2  CL 103 103  CO2 29 29  GLUCOSE 108* 106*  BUN 34* 41*  CREATININE 1.48* 1.62*   CBC    Component Value Date/Time   WBC 8.6 05/25/2014 0413   WBC 5.3 12/28/2013 0930   RBC 3.69* 05/25/2014 0413   RBC 5.02 12/28/2013 0930   HGB 10.2* 05/25/2014 0413   HGB 13.6* 12/28/2013 0930   HCT 30.1* 05/25/2014 0413   HCT 42.8* 12/28/2013 0930   PLT 294 05/25/2014 0413   MCV 81.6 05/25/2014 0413   MCV 85.3 12/28/2013 0930   MCH 27.6 05/25/2014 0413   MCH 27.1 12/28/2013 0930   MCHC 33.9 05/25/2014 0413   MCHC 31.8 12/28/2013 0930   RDW 14.8 05/25/2014 0413   LYMPHSABS 1.0 05/22/2014 0407   MONOABS 0.7 05/22/2014 0407   EOSABS 0.2 05/22/2014 0407   BASOSABS 0.0 05/22/2014 0407    CBC Latest Ref Rng 05/25/2014 05/24/2014 05/23/2014  WBC 4.0 - 10.5 K/uL 8.6 7.3 7.3  Hemoglobin 13.0 - 17.0 g/dL 10.2(L) 10.7(L) 10.6(L)  Hematocrit 39.0 - 52.0 % 30.1(L)  32.5(L) 31.5(L)  Platelets 150 - 400 K/uL 294 299 302    No results found for this basename: TROPONINI, CK, MB,  in the last 72 hours  Hepatic Function Panel No results found for this basename: PROT, ALBUMIN, AST, ALT, ALKPHOS, BILITOT, BILIDIR, IBILI,  in the last 72 hours No results found for this basename: INR,  in the last 72 hours BNP (last 3 results)  Recent Labs  05/17/14 0902 05/22/14 0407  PROBNP 10162.0* 15127.0*    Lipid Panel     Component Value Date/Time   CHOL 290* 12/28/2013 0921   TRIG 120 12/28/2013 0921   HDL 56 12/28/2013 0921   CHOLHDL 5.2 12/28/2013 0921   VLDL 24 12/28/2013 0921   LDLCALC 210* 12/28/2013 0921      Imaging:  No results found.    Assessment/Plan:   Principal Problem:   Acute respiratory failure with hypoxia Active Problems:   HTN (hypertension)   Hyperlipidemia   COPD (chronic obstructive pulmonary  disease)   Community acquired pneumonia   Acute renal failure   Systolic and diastolic CHF, acute on chronic   CKD (chronic kidney disease) stage 4, GFR 15-29 ml/min   Protein-calorie malnutrition, severe   Lung mass  1. Community-acquired PNA: Patchy parenchymal disease throughout the right lung and probably involving the left lower lung. Findings are suggestive for multi-focal pneumonia. F/U  CXR with mild left and severe right airspace disease suggesting multifocal pneumonia. There has been minimal improvement on the right.  Again seen is a more masslike opacity in the right apex. Antibiotics per primary service. Pt seen by pulmonary service yesterday. 2. COPD exacerbation: He is being treated with nebs and steroids. No wheezing today.  3. CHF: Acute/chronic systolic CHF, EF 35%. Now on bidil 1 tid . Now on  bisoprolol in place of Coreg for improved cardioselectivity,  titrated to 5 mg.  Not on ACE-I with recent renal insuff. BNP elevated. He was on Digoxin but I stopped it due to his age and worsening renal function  - Has wide QRS, LBBB. Given age, not ICD candidate but could consider CRT for symptom control.  He sees a cardiologist in the [place] system as well. He may be able to go home and be followed as OP. He can follow up with Dr. Aundra Dubin at [facility] if he wants to follow up here.  Otherwise, he may want to go to the [place]. His creatinine and BUN have continued to rise.  I suspect he may be volume depleted at this point. Will hold lasix today and re-evaluate tomorrow.    4. Renal insufficiency: Cr has gone back up slightly.  Agree with changing IV lasix to po ( will hold today) . Will DC digoxin given his age and renal insufficiency  5. Hyperlipidemia; Chol 290, LDL 210 from earlier in year; will increase atorvastain to 40 mg for more aggressive treatment. 6. Anemia; H/H 10.1/30.2   Thayer Headings, Brooke Bonito., MD, [facility] 05/25/2014, 7:48 AM [address]  [place] Pager [phone]

## 2014-05-25 NOTE — Discharge Instructions (Signed)
Continue Ensure Complete or Ensure Plus tid along with good intake.

## 2014-05-26 LAB — CBC
HCT: 30.8 % — ABNORMAL LOW (ref 39.0–52.0)
HEMOGLOBIN: 10.2 g/dL — AB (ref 13.0–17.0)
MCH: 27.7 pg (ref 26.0–34.0)
MCHC: 33.1 g/dL (ref 30.0–36.0)
MCV: 83.7 fL (ref 78.0–100.0)
Platelets: 296 10*3/uL (ref 150–400)
RBC: 3.68 MIL/uL — AB (ref 4.22–5.81)
RDW: 15 % (ref 11.5–15.5)
WBC: 7.5 10*3/uL (ref 4.0–10.5)

## 2014-05-26 LAB — BASIC METABOLIC PANEL
ANION GAP: 9 (ref 5–15)
BUN: 37 mg/dL — ABNORMAL HIGH (ref 6–23)
CHLORIDE: 108 meq/L (ref 96–112)
CO2: 28 mEq/L (ref 19–32)
Calcium: 9.6 mg/dL (ref 8.4–10.5)
Creatinine, Ser: 1.41 mg/dL — ABNORMAL HIGH (ref 0.50–1.35)
GFR calc non Af Amer: 44 mL/min — ABNORMAL LOW (ref >90)
GFR, EST AFRICAN AMERICAN: 51 mL/min — AB (ref >90)
Glucose, Bld: 93 mg/dL (ref 70–99)
POTASSIUM: 4 meq/L (ref 3.7–5.3)
Sodium: 145 mEq/L (ref 137–147)

## 2014-05-26 LAB — GLUCOSE, CAPILLARY: GLUCOSE-CAPILLARY: 85 mg/dL (ref 70–99)

## 2014-05-26 MED ORDER — ISOSORB DINITRATE-HYDRALAZINE 20-37.5 MG PO TABS: 1.0000 | ORAL_TABLET | Freq: Three times a day (TID) | ORAL | Status: DC

## 2014-05-26 MED ORDER — FUROSEMIDE 40 MG PO TABS: 40.0000 mg | ORAL_TABLET | Freq: Every day | ORAL | Status: DC

## 2014-05-26 MED ORDER — BISOPROLOL FUMARATE 5 MG PO TABS: 5.0000 mg | ORAL_TABLET | Freq: Every day | ORAL | Status: DC

## 2014-05-26 MED ORDER — IPRATROPIUM-ALBUTEROL 0.5-2.5 (3) MG/3ML IN SOLN
3.0000 mL | RESPIRATORY_TRACT | Status: DC
  Administered 2014-05-26 (×2): 3 mL via RESPIRATORY_TRACT
  Filled 2014-05-26 (×2): qty 3

## 2014-05-26 MED ORDER — LEVOFLOXACIN 750 MG PO TABS: 750.0000 mg | ORAL_TABLET | ORAL | Status: DC

## 2014-05-26 MED ORDER — IPRATROPIUM-ALBUTEROL 0.5-2.5 (3) MG/3ML IN SOLN: 3.0000 mL | RESPIRATORY_TRACT | Status: DC | PRN

## 2014-05-26 NOTE — Progress Notes (Signed)
[facility]  [facility] is providing the following services: Oxygen  If patient discharges after hours, please call [phone].   Linward Headland 05/26/2014, 12:23 PM

## 2014-05-26 NOTE — Care Management Note (Signed)
CARE MANAGEMENT NOTE 05/26/2014  Patient:  Edward Nunez, Edward Nunez   Account Number:  [id number]  Date Initiated:  05/18/2014  Documentation initiated by:  DAVIS,RHONDA  Subjective/Objective Assessment:   pt with hx of copd now with pna, o2 in ed at 81% on room air, placed on o2 at 4l/min     Action/Plan:   home -pt does [facility] with the [facility] about increased wob and copd.   Anticipated DC Date:  05/26/2014   Anticipated DC Plan:  [place]  In-house referral  NA      DC Planning Services  CM consult      [facility] Choice  NA   Choice offered to / List presented to:  C-1 Patient   DME arranged  [facility]      DME agency  [place] arranged  [place]   Status of service:  Completed, signed off Medicare Important Message given?  NA - LOS <3 / Initial given by admissions (If response is "NO", the following Medicare IM given date fields will be blank) Date Medicare IM given:  05/20/2014 Medicare IM given by:  Dessa Phi Date Additional Medicare IM given:  05/26/2014 Additional Medicare IM given by:  Leafy Kindle  Discharge Disposition:  [place]  Per UR Regulation:  Reviewed for med. necessity/level of care/duration of stay  If discussed at [place] of Stay Meetings, dates discussed:   05/21/2014  05/26/2014    Comments:  05/26/14 Marney Doctor Rn, BSN NCM [facility]) confirmed start of care tomorrow 05/27/14 for [facility] PT.  Faxed orders with confirmation.  Home O2 sats qualify, home O2 order written.  [place] DME rep Pura Spice aware of order and DC today.  No further DC needs.   05/21/14 KATHY MAHABIR RN,BSN NCM [address]) TEL#[phone],THEY CAN START SERVICES ON 05/25/14 EVEN HE HE DISCHARGES 8/14-8/16,CALL THEM WITH UPDATES.THEY DO NEED HHC ORDERS FAXED TO [phone].PATIENT QUALIFIES FOR HOME 02.[facility] DME REP [place] AWARE OF 02 SATS.AWAIT FINAL  HHC/DME ORDERS.PATIENT VOICED UNDERSTANDING.  05/20/14 KATHY MAHABIR RN,BSN  NCM [place].PT-HH.[facility]-SPKE TO KAREN @ [phone]/FAXED W/CONFIRMATION [phone]-FACE SHEET,H&P.TENTATIVE SOC DATE 05/25/14.AWAIT HHPT/OT ORDER.  [id number]/WLNLGX Rosana Hoes, RN, BSN, CCM: [id number] Chart reviewed for discharge needs.  Patient used [facility] for med.needs.Has an upcoming apointment. Next chart review due on [id number].

## 2014-05-26 NOTE — Discharge Summary (Addendum)
Physician Discharge Summary  Edward Nunez VQX:[id number] DOB: 02-08-1926 DOA: 05/17/2014  PCP: [name], MD  Admit date: 05/17/2014 Discharge date: 05/26/2014  Time spent: 55* minutes  Recommendations for Outpatient Follow-up:  1. Follow up PCP in 2 weeks  Discharge Diagnoses:  Principal Problem:   Acute respiratory failure with hypoxia Active Problems:   HTN (hypertension)   Hyperlipidemia   COPD (chronic obstructive pulmonary disease)   Community acquired pneumonia   Acute renal failure   Systolic and diastolic CHF, acute on chronic   CKD (chronic kidney disease) stage 4, GFR 15-29 ml/min   Protein-calorie malnutrition, severe   Lung mass   Discharge Condition: *Stable  Diet recommendation: Low salt diet  Filed Weights   05/24/14 0536 05/25/14 0122 05/26/14 0432  Weight: 63.8 kg (140 lb 10.5 oz) 64 kg (141 lb 1.5 oz) 63.2 kg (139 lb 5.3 oz)    History of present illness:  80 yo male with HTN, HLD, prostate cancer but on no active treatment, CKD stage II - III, COPD, severe combined systolic and diastolic CHF (EF 00%), presented to [facility] ED with main concern of progressively worsening shortness of breath, worse with exertion and somewhat better with resting but no other alleviating factors. Pt denies fevers, chills, chest pain, no specific abdominal or urinary concerns.  In ED, pt noted to be hemodynamically stable but hypoxic with oxygen saturations in mid 80's or RA (please note he is not on oxygen at home). BMP notable for Cr 1.49. TRH asked to admit to SDU for further evaluation and management   Hospital Course:  Acute respiratory failure with hypoxia  Likely combination of community acquired pneumonia, acute COPD exacerbation and acute on chronic diastolic CHF  Respiratory status remains stable.  Patient was started on zithromycin and rocephin for treatment of pneumonia. Respiratory culture grew oropharyngeal flora. Legionella and strep pneumonia results negative. s.   Oxygen support via [place] to keep O2 saturation above 90%, will need home oxygen as the o2 sat drop to 81% on RA on walking.  Lung mass  CXR shows mass like opacity in the right apex,was seen by pulmonary, no work up in the hospital, he has been followed by [place], obtained the records from [place], CT chest ( jul 13,2015) showed the unchanged  mass like consolidation in the right apex measuring 5 cm. Will follow up pulmonary at [place] / irregular hear rate  Rate controlled this am. Per cardio, started Zebeta 2.5 mg PO daily and Bidil TID  COPD (chronic obstructive pulmonary disease)  Continued duoneb every 6 hours scheduled and every 2 hours PRN. Continued solumedrol 40 mg IV daily for next 24 hours. Prednisone po was started and it has been weaned off. Continued abx for pneumonia, azithromycin and rocephin.  Home oxygen ordered. COPD gold alert order placed   Community acquired pneumonia  Continue azithromycin and rocephin  Levaquin was started on 8/16, has completed 9 days of antibiotics in the hospital. Will not discharge on the antibiotics.  Systolic and diastolic CHF, acute on chronic  BNP elevated in 10,000 range which may be also due to renal insufficiency. Echo showed EF 20%, cardiology was consulted . Started zebeta  Was started on IV lasix and diuresed well, will discharge home on po lasix 40 mg daily.  HTN (hypertension)  Hold fosinopril and hctz  BP on admission on lower side so held antihypertensives.  Started on Bidil and zebeta, will discontinue coreg.  Hyperlipidemia  Continue statin therapy  Chronic kidney disease, stage IV  Stable, cr today is 1.41   Procedures:  Echo LVEF 20%, global hypokinesis with inferior scar, probably moderate aortic stenosis with low flow, low gradient state, there is diastolic dysfunction as expected and elevated LV filling pressures. A trivial pericardial effusion is present. This represents a lower EF than  reported from a prior echo at the [facility] of 35-40% in 01/2014.     Consultations:  Cardiology  Pulmonary   Discharge Exam: Filed Vitals:   05/26/14 0432  BP: 110/60  Pulse: 78  Temp: 97.4 F (36.3 C)  Resp: 24    General: Appear in no acute distress Cardiovascular: s1s2 RRR Respiratory: *Clear bilaterally  Discharge Instructions You were cared for by a hospitalist during your hospital stay. If you have any questions about your discharge medications or the care you received while you were in the hospital after you are discharged, you can call the unit and asked to speak with the hospitalist on call if the hospitalist that took care of you is not available. Once you are discharged, your primary care physician will handle any further medical issues. Please note that NO REFILLS for any discharge medications will be authorized once you are discharged, as it is imperative that you return to your primary care physician (or establish a relationship with a primary care physician if you do not have one) for your aftercare needs so that they can reassess your need for medications and monitor your lab values.  Discharge Instructions   Diet - low sodium heart healthy    Complete by:  As directed      Discharge instructions    Complete by:  As directed   Follow up [facility] for the further work up of lung mass     Increase activity slowly    Complete by:  As directed             Medication List    STOP taking these medications       carvedilol 3.125 MG tablet  Commonly known as:  COREG     fosinopril 40 MG tablet  Commonly known as:  MONOPRIL     hydrochlorothiazide 25 MG tablet  Commonly known as:  HYDRODIURIL      TAKE these medications       albuterol-ipratropium 18-103 MCG/ACT inhaler  Commonly known as:  COMBIVENT  Inhale 1 puff into the lungs every 4 (four) hours.     ipratropium-albuterol 0.5-2.5 (3) MG/3ML Soln  Commonly known as:  DUONEB  Take 3  mLs by nebulization every 4 (four) hours as needed (shortness of breath).     allopurinol 300 MG tablet  Commonly known as:  ZYLOPRIM  Take 300 mg by mouth daily.     aspirin 81 MG tablet  Take 81 mg by mouth daily.     atorvastatin 20 MG tablet  Commonly known as:  LIPITOR  Take 20 mg by mouth daily.     bisoprolol 5 MG tablet  Commonly known as:  ZEBETA  Take 1 tablet (5 mg total) by mouth daily.     furosemide 40 MG tablet  Commonly known as:  LASIX  Take 1 tablet (40 mg total) by mouth daily.     isosorbide-hydrALAZINE 20-37.5 MG per tablet  Commonly known as:  BIDIL  Take 1 tablet by mouth 3 (three) times daily.     levofloxacin 750 MG tablet  Commonly known as:  LEVAQUIN  Take 1 tablet (  750 mg total) by mouth every other day.       No Known Allergies    The results of significant diagnostics from this hospitalization (including imaging, microbiology, ancillary and laboratory) are listed below for reference.    Significant Diagnostic Studies: Dg Chest 2 View  05/20/2014   CLINICAL DATA:  Weakness is shortness of breath, possible pneumonia  EXAM: CHEST  2 VIEW  COMPARISON:  05/17/2014  FINDINGS: Heavy diffuse interstitial infiltrate throughout the right lung, minimally improved. More subtle interstitial infiltrate left lower lobe stable. More focal consolidative ovoid 2 cm right apical opacity stable. There is a small right pleural effusion.  IMPRESSION: Findings again consistent with mild left and severe right airspace disease suggesting multifocal pneumonia. There has been minimal improvement on the right.  Again seen is a more masslike opacity in the right apex. Followup to document resolution is recommended. This opacity is new from 2010 and also represents a change from 2014. Right apical mass is not excluded.   Electronically Signed   By: Skipper Cliche M.D.   On: 05/20/2014 13:37   Dg Chest 2 View  05/17/2014   CLINICAL DATA:  Shortness of breath and COPD.  EXAM:  CHEST  2 VIEW  COMPARISON:  12/06/2013  FINDINGS: Two views of the chest demonstrate hyperinflation and emphysema. There are increased patchy parenchymal densities throughout the right lung. Again noted is a more confluent density near the right lung apex. Increased patchy densities at the left lung base. Difficult to exclude small pleural effusions. Heart size is grossly stable.  IMPRESSION: Patchy parenchymal disease throughout the right lung and probably involving the left lower lung. Findings are suggestive for multi-focal pneumonia. Probable small effusions.  Persistent density near the right lung apex is nonspecific. Recommend follow-up chest radiographs to ensure resolution.   Electronically Signed   By: Markus Daft M.D.   On: 05/17/2014 09:44    Microbiology: Recent Results (from the past 240 hour(s))  URINE CULTURE     Status: None   Collection Time    05/17/14 12:17 PM      Result Value Ref Range Status   Specimen Description URINE, CLEAN CATCH   Final   Special Requests NONE   Final   Culture  Setup Time     Final   Value: 05/17/2014 18:56     Performed at [place]     Final   Value: NO GROWTH     Performed at [facility]   Culture     Final   Value: NO GROWTH     Performed at [facility]   Report Status 05/18/2014 FINAL   Final  MRSA PCR SCREENING     Status: None   Collection Time    05/17/14 12:45 PM      Result Value Ref Range Status   MRSA by PCR NEGATIVE  NEGATIVE Final   Comment:            The GeneXpert MRSA Assay (FDA     approved for NASAL specimens     only), is one component of a     comprehensive MRSA colonization     surveillance program. It is not     intended to diagnose MRSA     infection nor to guide or     monitor treatment for     MRSA infections.  CULTURE, EXPECTORATED SPUTUM-ASSESSMENT     Status: None   Collection Time  05/17/14  3:30 PM      Result Value Ref Range Status   Specimen Description  SPUTUM   Final   Special Requests NONE   Final   Sputum evaluation     Final   Value: THIS SPECIMEN IS ACCEPTABLE. RESPIRATORY CULTURE REPORT TO FOLLOW.   Report Status 05/17/2014 FINAL   Final  CULTURE, RESPIRATORY (NON-EXPECTORATED)     Status: None   Collection Time    05/17/14  3:30 PM      Result Value Ref Range Status   Specimen Description SPUTUM   Final   Special Requests NONE   Final   Gram Stain     Final   Value: RARE WBC PRESENT, PREDOMINANTLY PMN     FEW SQUAMOUS EPITHELIAL CELLS PRESENT     FEW GRAM POSITIVE COCCI     IN PAIRS IN CLUSTERS RARE GRAM POSITIVE RODS     RARE GRAM NEGATIVE RODS   Culture     Final   Value: NORMAL OROPHARYNGEAL FLORA     Performed at [facility]   Report Status 05/20/2014 FINAL   Final     Labs: Basic Metabolic Panel:  Recent Labs Lab 05/22/14 0407 05/23/14 0505 05/24/14 0516 05/25/14 0413 05/26/14 0450  NA 143 146 142 142 145  K 4.0 4.0 3.6* 4.2 4.0  CL 106 106 103 103 108  CO2 26 30 29 29 28   GLUCOSE 106* 95 108* 106* 93  BUN 26* 28* 34* 41* 37*  CREATININE 1.14 1.35 1.48* 1.62* 1.41*  CALCIUM 9.9 10.0 9.7 9.5 9.6   Liver Function Tests: No results found for this basename: AST, ALT, ALKPHOS, BILITOT, PROT, ALBUMIN,  in the last 168 hours No results found for this basename: LIPASE, AMYLASE,  in the last 168 hours No results found for this basename: AMMONIA,  in the last 168 hours CBC:  Recent Labs Lab 05/22/14 0407 05/23/14 0505 05/24/14 0516 05/25/14 0413 05/26/14 0450  WBC 8.0 7.3 7.3 8.6 7.5  NEUTROABS 6.0  --   --   --   --   HGB 10.1* 10.6* 10.7* 10.2* 10.2*  HCT 30.2* 31.5* 32.5* 30.1* 30.8*  MCV 82.1 81.6 82.9 81.6 83.7  PLT 274 302 299 294 296   Cardiac Enzymes: No results found for this basename: CKTOTAL, CKMB, CKMBINDEX, TROPONINI,  in the last 168 hours BNP: BNP (last 3 results)  Recent Labs  05/17/14 0902 05/22/14 0407  PROBNP 10162.0* 15127.0*   CBG:  Recent Labs Lab  05/22/14 0731 05/23/14 0756 05/24/14 0815 05/25/14 0728 05/26/14 0715  GLUCAP 107* 99 99 96 85       Signed:  Khayden Herzberg S  [facility] 05/26/2014, 11:35 AM

## 2014-05-26 NOTE — Progress Notes (Signed)
Pt room air o2 sat at rest was 88%

## 2014-05-27 ENCOUNTER — Other Ambulatory Visit: Payer: Self-pay | Admitting: Emergency Medicine

## 2014-05-27 ENCOUNTER — Telehealth: Payer: Self-pay

## 2014-05-27 DIAGNOSIS — J441 Chronic obstructive pulmonary disease with (acute) exacerbation: Secondary | ICD-10-CM

## 2014-05-27 NOTE — Telephone Encounter (Signed)
Pended order for nebulizer. Please advise.

## 2014-05-27 NOTE — Telephone Encounter (Signed)
Patient was recently hospitalized and upon discharge he as given medication to take through a nebulizer, however, he was not prescribed a nebulizer. The nurse told him to follow up with his pcp to order a nebulizer

## 2014-05-27 NOTE — Telephone Encounter (Signed)
Order has been sent for nebulized

## 2014-05-28 NOTE — Telephone Encounter (Signed)
Patient's daughter, Annell Greening, is calling back on behalf of patient to follow up from a phone message she left yesterday 8/19 in reguards to patient's needs for a nebulizer. Patient sees Dr. Linna Darner as his primary and was told that he has to get it ordered by his PCP. Please call Langley Gauss [phone]

## 2014-05-28 NOTE — Telephone Encounter (Signed)
Tried to rtn call to pt- number left in message is incorrect/ chart is disconnected.  This order was sent this morning.

## 2014-06-01 ENCOUNTER — Ambulatory Visit (INDEPENDENT_AMBULATORY_CARE_PROVIDER_SITE_OTHER): Payer: Medicare Other | Admitting: Family Medicine

## 2014-06-01 ENCOUNTER — Ambulatory Visit (INDEPENDENT_AMBULATORY_CARE_PROVIDER_SITE_OTHER): Payer: Medicare Other

## 2014-06-01 VITALS — BP 110/60 | HR 71 | Temp 97.8°F | Resp 18 | Ht 72.0 in | Wt 141.0 lb

## 2014-06-01 DIAGNOSIS — R0609 Other forms of dyspnea: Secondary | ICD-10-CM

## 2014-06-01 DIAGNOSIS — D649 Anemia, unspecified: Secondary | ICD-10-CM

## 2014-06-01 DIAGNOSIS — J189 Pneumonia, unspecified organism: Secondary | ICD-10-CM

## 2014-06-01 DIAGNOSIS — R918 Other nonspecific abnormal finding of lung field: Secondary | ICD-10-CM

## 2014-06-01 DIAGNOSIS — R0600 Dyspnea, unspecified: Secondary | ICD-10-CM

## 2014-06-01 DIAGNOSIS — R0989 Other specified symptoms and signs involving the circulatory and respiratory systems: Secondary | ICD-10-CM

## 2014-06-01 DIAGNOSIS — R222 Localized swelling, mass and lump, trunk: Secondary | ICD-10-CM

## 2014-06-01 LAB — POCT CBC
Granulocyte percent: 74.3 %G (ref 37–80)
HCT, POC: 36.8 % — AB (ref 43.5–53.7)
Hemoglobin: 11.4 g/dL — AB (ref 14.1–18.1)
Lymph, poc: 1.5 (ref 0.6–3.4)
MCH: 26.7 pg — AB (ref 27–31.2)
MCHC: 31 g/dL — AB (ref 31.8–35.4)
MCV: 86 fL (ref 80–97)
MID (CBC): 0.4 (ref 0–0.9)
MPV: 7.2 fL (ref 0–99.8)
PLATELET COUNT, POC: 331 10*3/uL (ref 142–424)
POC Granulocyte: 5.6 (ref 2–6.9)
POC LYMPH %: 20 % (ref 10–50)
POC MID %: 5.7 %M (ref 0–12)
RBC: 4.28 M/uL — AB (ref 4.69–6.13)
RDW, POC: 15.8 %
WBC: 7.6 10*3/uL (ref 4.6–10.2)

## 2014-06-01 MED ORDER — CEFDINIR 300 MG PO CAPS: 300.0000 mg | ORAL_CAPSULE | Freq: Two times a day (BID) | ORAL | Status: DC

## 2014-06-01 NOTE — Patient Instructions (Addendum)
Take the [facility] antibiotic one twice daily for the residual infection  We will schedule you a CT scan of your chest for about a week from now.  Return to see me in about 3 weeks  Referral is being made also to a lung specialist  Consider getting completion of advanced directives including a living will, medical power of attorney, and power of attorney if necessary.

## 2014-06-01 NOTE — Progress Notes (Signed)
Subjective: Patient was recently at the hospital with a pneumonia. He was not sent home on any antibiotics, but he has continued to improve. He was given nebulizer treatments take when he got home, but he was not given a nebulizer. He has been losing weight. His family would like something to help him gain weight. He has a pulmonary lesion on his chest x-ray as well as the infiltrate. He is breathing a little bit better. He is on home oxygen. I changed a lot of his medications in the hospital, and the daughter had the list of the current meds which we reviewed. Was told he had stage IV chronic kidney disease.  Objective: Patient is in a wheelchair, walks with a walker at home. Alert and oriented. He has lost weight since I last saw him. Throat clear. Neck supple without nodes. Chest has coarse breath sounds on the right throughout the lungs. Heart regular without murmur. No ankle edema.   [facility] reading (PRIMARY) by  Dr. Linna Darner Mass right upper lung. Patchy infiltrates in the upper and lower lungs, right. This appears a little bit better than his last ones from the hospital in my opinion.  Assessment: Pneumonia, improving Right upper lobe pulmonary mass, suspicious for malignancy Weight loss Anemia Chronic kidney disease  Plan: Results for orders placed in visit on 06/01/14  POCT CBC      Result Value Ref Range   WBC 7.6  4.6 - 10.2 K/uL   Lymph, poc 1.5  0.6 - 3.4   POC LYMPH PERCENT 20.0  10 - 50 %L   MID (cbc) 0.4  0 - 0.9   POC MID % 5.7  0 - 12 %M   POC Granulocyte 5.6  2 - 6.9   Granulocyte percent 74.3  37 - 80 %G   RBC 4.28 (*) 4.69 - 6.13 M/uL   Hemoglobin 11.4 (*) 14.1 - 18.1 g/dL   HCT, POC 36.8 (*) 43.5 - 53.7 %   MCV 86.0  80 - 97 fL   MCH, POC 26.7 (*) 27 - 31.2 pg   MCHC 31.0 (*) 31.8 - 35.4 g/dL   RDW, POC 15.8     Platelet Count, POC 331  142 - 424 K/uL   MPV 7.2  0 - 99.8 fL   . We will refer him to pulmonology. Ordered a noncontrast CT of his chest. Decided to  place him on [facility]. Return in 2 weeks.

## 2014-06-02 ENCOUNTER — Telehealth: Payer: Self-pay

## 2014-06-02 DIAGNOSIS — R0609 Other forms of dyspnea: Secondary | ICD-10-CM

## 2014-06-02 DIAGNOSIS — R0989 Other specified symptoms and signs involving the circulatory and respiratory systems: Principal | ICD-10-CM

## 2014-06-02 LAB — COMPREHENSIVE METABOLIC PANEL
ALT: 13 U/L (ref 0–53)
AST: 16 U/L (ref 0–37)
Albumin: 3.2 g/dL — ABNORMAL LOW (ref 3.5–5.2)
Alkaline Phosphatase: 80 U/L (ref 39–117)
BUN: 26 mg/dL — ABNORMAL HIGH (ref 6–23)
CO2: 28 mEq/L (ref 19–32)
Calcium: 9.6 mg/dL (ref 8.4–10.5)
Chloride: 105 mEq/L (ref 96–112)
Creat: 1.41 mg/dL — ABNORMAL HIGH (ref 0.50–1.35)
Glucose, Bld: 103 mg/dL — ABNORMAL HIGH (ref 70–99)
Potassium: 3.9 mEq/L (ref 3.5–5.3)
Sodium: 142 mEq/L (ref 135–145)
Total Bilirubin: 0.4 mg/dL (ref 0.2–1.2)
Total Protein: 7 g/dL (ref 6.0–8.3)

## 2014-06-02 NOTE — Telephone Encounter (Signed)
Pt needs order re faxed. They will be able to hand deliver neb.  Pt daughter advised. Re ordered neb for Edward Nunez to sign.  [phone]  Fax sent-

## 2014-06-02 NOTE — Telephone Encounter (Signed)
Patient's daughter states that an order was to be faxed to [place] for her father and they have not received anything yet.   Please call daughter Hakim Minniefield at [phone]

## 2014-06-03 NOTE — Telephone Encounter (Signed)
Patients daughter, Annell Greening, is calling on behalf of the patient in regards to order that was placed for the nebulizer. She states that the order was placed but the person helping her at [place] thinks we might have the wrong fax number. Please fax to Charlean Merl at [facility]. Fax: [phone]. She can also be e-mailed at [email or web address]. Patients daughter would like a phone call once the order has been faxed. Daughters home phone: [phone]

## 2014-06-03 NOTE — Telephone Encounter (Signed)
Spoke to [place] they are working on the order now.

## 2014-06-03 NOTE — Telephone Encounter (Signed)
Refaxed order to [place] have confirmation. Advised Langley Gauss

## 2014-06-09 ENCOUNTER — Encounter: Payer: Self-pay | Admitting: Internal Medicine

## 2014-06-09 ENCOUNTER — Ambulatory Visit (INDEPENDENT_AMBULATORY_CARE_PROVIDER_SITE_OTHER): Payer: Medicare Other | Admitting: Internal Medicine

## 2014-06-09 VITALS — BP 120/66 | HR 71 | Temp 98.0°F | Ht 72.0 in | Wt 145.0 lb

## 2014-06-09 DIAGNOSIS — R222 Localized swelling, mass and lump, trunk: Secondary | ICD-10-CM

## 2014-06-09 DIAGNOSIS — R918 Other nonspecific abnormal finding of lung field: Secondary | ICD-10-CM

## 2014-06-09 DIAGNOSIS — J438 Other emphysema: Secondary | ICD-10-CM

## 2014-06-09 DIAGNOSIS — I509 Heart failure, unspecified: Secondary | ICD-10-CM

## 2014-06-09 DIAGNOSIS — I5043 Acute on chronic combined systolic (congestive) and diastolic (congestive) heart failure: Secondary | ICD-10-CM

## 2014-06-09 DIAGNOSIS — J9601 Acute respiratory failure with hypoxia: Secondary | ICD-10-CM

## 2014-06-09 DIAGNOSIS — J432 Centrilobular emphysema: Secondary | ICD-10-CM

## 2014-06-09 DIAGNOSIS — J9600 Acute respiratory failure, unspecified whether with hypoxia or hypercapnia: Secondary | ICD-10-CM

## 2014-06-09 NOTE — Patient Instructions (Addendum)
Increase the lasix to 40 mg daily and stay on 02 24/7 and see Dr Linna Darner as planned   Late add:  rec PET scan around 07/09/14  since getting over pna from mid Aug 2015 and needs 6 week interval to PET)  and bx most accessible location for tissue dx but not an operative candidate at this point and doubt he would even tolerate much systemic chemo if did turn out to be a neoplasm so not much benefit in "early dx" here.   Ann Lions is pulmonary doctor who originally saw him so should be referred back to him prn.

## 2014-06-09 NOTE — Assessment & Plan Note (Addendum)
See echo 05/17/14  -LVEF 20%, global hypokinesis with inferior scar, probably moderate aortic stenosis with low flow, low gradient state, there is diastolic dysfunction as expected and elevated LV filling pressures. A trivial pericardial effusion is present. This represents a lower EF than reported from a prior echo at the [facility] of 35-40% in 01/2014  Increased lasix to 40 mg daily and f/u Dr Linna Darner  Advise caution with acei as this may have resulted in renal insufficiency/ pseudoasthma on recent admit and would add back clinical uncertainty in interpretation of ambiguous symptoms if restarted at this point

## 2014-06-09 NOTE — Assessment & Plan Note (Signed)
rec PET for around Jul 09 2014 and bx best site as had clinical pna mid Aug 2015   Comment: clearly not a candidate for lobectomy or aggressive systemic rx but need tissue dx once over his acute deterioration which was probably not related to the peripheral / apical mass on cxr

## 2014-06-09 NOTE — Assessment & Plan Note (Addendum)
  When respiratory symptoms begin or become refractory well after a patient reports complete smoking cessation,  especially when this wasn't the case while they were smoking, a red flag is raised based on the work of Dr Kris Mouton which states:  if you quit smoking when your best day FEV1 is still well preserved it is highly unlikely you will progress to severe disease.  That is to say, once the smoking stops,  the symptoms should not suddenly erupt or markedly worsen.  If so, the differential diagnosis should include  obesity/deconditioning,  LPR/Reflux/Aspiration syndromes,  occult CHF, or  especially side effect of medications commonly used in this population, which previously included acei prior to his admit and should therefore probably be avoided here   In this case chf clearly the problem > see chf a/p In meantime just use the duoneb prn

## 2014-06-09 NOTE — Progress Notes (Signed)
Subjective:    Patient ID: Edward Nunez, male    DOB: 20-Jan-1926   MRN: [id number]  HPI  80 yobm quit smoking 2004 with onset Oct 2014 sob / wt loss / loss of appetite / coughing > [facility]  > rec bx of R Apex but in meantime breahting much worse >>>  Admit date: 05/17/2014  Discharge date: 05/26/2014  Discharge Diagnoses:  Acute respiratory failure with hypoxia  HTN (hypertension)  Hyperlipidemia  COPD (chronic obstructive pulmonary disease)  Community acquired pneumonia  Acute renal failure  Systolic and diastolic CHF, acute on chronic  CKD (chronic kidney disease) stage 4, GFR 15-29 ml/min  Protein-calorie malnutrition, severe  Lung mass   Discharge Condition: *Stable  Diet recommendation: Low salt diet  Filed Weights    05/24/14 0536  05/25/14 0122  05/26/14 0432   Weight:  63.8 kg (140 lb 10.5 oz)  64 kg (141 lb 1.5 oz)  63.2 kg (139 lb 5.3 oz)   History of present illness:  80 yo male with HTN, HLD, prostate cancer but on no active treatment, CKD stage II - III, COPD, severe combined systolic and diastolic CHF (EF 06%), presented to [facility] ED with main concern of progressively worsening shortness of breath, worse with exertion and somewhat better with resting but no other alleviating factors. Pt denies fevers, chills, chest pain, no specific abdominal or urinary concerns.  In ED, pt noted to be hemodynamically stable but hypoxic with oxygen saturations in mid 80's or RA (please note he is not on oxygen at home). BMP notable for Cr 1.49. TRH asked to admit to SDU for further evaluation and management  Hospital Course:  Acute respiratory failure with hypoxia  Likely combination of community acquired pneumonia, acute COPD exacerbation and acute on chronic diastolic CHF  Respiratory status remains stable.  Patient was started on zithromycin and rocephin for treatment of pneumonia. Respiratory culture grew oropharyngeal flora. Legionella and strep pneumonia results negative.  s.  Oxygen support via [place] to keep O2 saturation above 90%, will need home oxygen as the o2 sat drop to 81% on RA on walking. Lung mass  CXR shows mass like opacity in the right apex,was seen by pulmonary, no work up in the hospital, he has been followed by [place], obtained the records from [place], CT chest ( jul 13,2015) showed the unchanged mass like consolidation in the right apex measuring 5 cm. Will follow up pulmonary at [place] / irregular hear rate  Rate controlled this am. Per cardio, started Zebeta 2.5 mg PO daily and Bidil TID  COPD (chronic obstructive pulmonary disease)  Continued duoneb every 6 hours scheduled and every 2 hours PRN. Continued solumedrol 40 mg IV daily for next 24 hours. Prednisone po was started and it has been weaned off.  Continued abx for pneumonia, azithromycin and rocephin.  Home oxygen ordered.  COPD gold alert order placed  Community acquired pneumonia  Continue azithromycin and rocephin  Levaquin was started on 8/16, has completed 9 days of antibiotics in the hospital. Will not discharge on the antibiotics. Systolic and diastolic CHF, acute on chronic  BNP elevated in 10,000 range which may be also due to renal insufficiency. Echo showed EF 20%, cardiology was consulted  . Started zebeta  Was started on IV lasix and diuresed well, will discharge home on po lasix 40 mg daily. HTN (hypertension)  Hold fosinopril and hctz  BP on admission on lower side so held  antihypertensives.  Started on Bidil and zebeta, will discontinue coreg. Hyperlipidemia  Continue statin therapy  Chronic kidney disease, stage IV  Stable, cr today is 1.41  Procedures:  Echo LVEF 20%, global hypokinesis with inferior scar, probably moderate aortic stenosis with low flow, low gradient state, there is diastolic dysfunction as expected and elevated LV filling pressures. A trivial pericardial effusion is present. This represents a lower EF than reported from a  prior echo at the [facility] of 35-40% in 01/2014.       06/09/2014 pulmonary consultation Artemisia Auvil  bx never done so referred to pulmonary clinic by Dr [name]    Chief Complaint  Patient presents with  . Pulmonary Consult    Referred by Dr. [name] for eval of lung mass.  Pt c/o SOB since Jan 2015- progressively getting worse. He states that he is SOB with any type of exertion. He also c/o hemoptysis for the past 6 wks. He is using neb on average 2-3 times per day and has combivnet inhaler on hand but has not been using.   cough better off acei, breathing better on 02 and leg swelling gone until reduced lasix to 20 mg per day and then worse again      No obvious other patterns in day to day or daytime variabilty or assoc   or cp or chest tightness, subjective wheeze overt sinus or hb symptoms. No unusual exp hx or h/o childhood pna/ asthma or knowledge of premature birth.  Sleeping ok without nocturnal  or early am exacerbation  of respiratory  c/o's or need for noct saba. Also denies any obvious fluctuation of symptoms with weather or environmental changes or other aggravating or alleviating factors except as outlined above   Current Medications, Allergies, Complete Past Medical History, Past Surgical History, Family History, and Social History were reviewed in [employer] record.           Review of Systems  Constitutional: Positive for unexpected weight change. Negative for fever, chills, activity change and appetite change.  HENT: Negative for congestion, dental problem, postnasal drip, rhinorrhea, sneezing, sore throat, trouble swallowing and voice change.   Eyes: Negative for visual disturbance.  Respiratory: Positive for cough and shortness of breath. Negative for choking.   Cardiovascular: Negative for chest pain and leg swelling.  Gastrointestinal: Negative for nausea, vomiting and abdominal pain.  Genitourinary: Negative for difficulty  urinating.  Musculoskeletal: Negative for arthralgias.  Skin: Negative for rash.  Psychiatric/Behavioral: Negative for behavioral problems and confusion.       Objective:   Physical Exam  W/c bound elderly bm on 02   Wt Readings from Last 3 Encounters:  06/09/14 145 lb (65.772 kg)  06/01/14 141 lb (63.957 kg)  05/26/14 139 lb 5.3 oz (63.2 kg)     HEENT mild turbinate edema.  Oropharynx no thrush or excess pnd or cobblestoning.  No JVD or cervical adenopathy. Mild accessory muscle hypertrophy. Trachea midline, nl thryroid. Chest was hyperinflated by percussion with diminished breath sounds and moderate increased exp time without wheeze. Hoover sign positive at mid inspiration. Regular rate and rhythm without murmur gallop or rub or increase P2 or 2+  edema.  Abd: no hsm, nl excursion. Ext warm without cyanosis or clubbing.      CXR  06/01/14  1. Persistent bilateral airspace disease particularly prominent on  the right. No change.  2. Persistent right apical masslike density, no change from prior  exam. As noted on  prior studies follow-up will be needed to  demonstrate clearing.         Assessment & Plan:

## 2014-06-09 NOTE — Assessment & Plan Note (Signed)
Likely multifactorial but mostly related to chf and worse edema on lower dose lasix so rec double the dose and f/u as planned with Dr Linna Darner.

## 2014-06-11 ENCOUNTER — Institutional Professional Consult (permissible substitution): Payer: Medicare Other | Admitting: Internal Medicine

## 2014-06-12 ENCOUNTER — Telehealth: Payer: Self-pay

## 2014-06-12 NOTE — Telephone Encounter (Signed)
Pt's daughter called concerned because pt is wanting to drive and says Dr. Linna Darner told him to hold off for 3 weeks until he RTC. Spoke with pt and advised that it wouldn't be safe for him to drive at least until re-evaluated by Dr. Linna Darner. Pt was agreeable.

## 2014-06-19 ENCOUNTER — Ambulatory Visit
Admission: RE | Admit: 2014-06-19 | Discharge: 2014-06-19 | Disposition: A | Discharge status: Home / Self Care | Payer: Medicare Other | Source: Ambulatory Visit | Attending: Family Medicine | Admitting: Family Medicine

## 2014-06-19 DIAGNOSIS — R918 Other nonspecific abnormal finding of lung field: Secondary | ICD-10-CM

## 2014-06-20 ENCOUNTER — Ambulatory Visit (INDEPENDENT_AMBULATORY_CARE_PROVIDER_SITE_OTHER): Payer: Medicare Other | Admitting: Family Medicine

## 2014-06-20 VITALS — BP 100/56 | HR 62 | Resp 16 | Ht 70.0 in | Wt 132.8 lb

## 2014-06-20 DIAGNOSIS — J90 Pleural effusion, not elsewhere classified: Secondary | ICD-10-CM

## 2014-06-20 DIAGNOSIS — R531 Weakness: Secondary | ICD-10-CM

## 2014-06-20 DIAGNOSIS — R222 Localized swelling, mass and lump, trunk: Secondary | ICD-10-CM

## 2014-06-20 DIAGNOSIS — R5381 Other malaise: Secondary | ICD-10-CM

## 2014-06-20 DIAGNOSIS — R599 Enlarged lymph nodes, unspecified: Secondary | ICD-10-CM

## 2014-06-20 DIAGNOSIS — J449 Chronic obstructive pulmonary disease, unspecified: Secondary | ICD-10-CM

## 2014-06-20 DIAGNOSIS — R590 Localized enlarged lymph nodes: Secondary | ICD-10-CM

## 2014-06-20 DIAGNOSIS — R5383 Other fatigue: Secondary | ICD-10-CM

## 2014-06-20 DIAGNOSIS — R918 Other nonspecific abnormal finding of lung field: Secondary | ICD-10-CM

## 2014-06-20 DIAGNOSIS — R0602 Shortness of breath: Secondary | ICD-10-CM

## 2014-06-20 LAB — BASIC METABOLIC PANEL
BUN: 22 mg/dL (ref 6–23)
CALCIUM: 10.1 mg/dL (ref 8.4–10.5)
CO2: 23 mEq/L (ref 19–32)
CREATININE: 1.88 mg/dL — AB (ref 0.50–1.35)
Chloride: 104 mEq/L (ref 96–112)
Glucose, Bld: 118 mg/dL — ABNORMAL HIGH (ref 70–99)
Potassium: 4 mEq/L (ref 3.5–5.3)
Sodium: 140 mEq/L (ref 135–145)

## 2014-06-20 NOTE — Patient Instructions (Signed)
Use parking permit  No driving  Eat as much as you can.  Consider Ensure supplements for snacks if desired  Wear oxygen as much of the time as possible.  We will work on a hospital bed for home  We will request a visiting nurse

## 2014-06-20 NOTE — Progress Notes (Signed)
Subjective:  Patient has come back in for a followup visit, having gotten his CT scan yesterday. He has been losing some weight. Some of that may be from being on the diuretic. Dr. work to increase his Lasix back to 40 mg. He is still short of breath. He takes off his oxygen off at night. His grandfather will be going home soon. The other daughter is caring for him. They want to discuss a parking permit, home health nurse, and hospital bed. He denies any pain.  Objective: Review the CT scan with the patient and family. Showed x-ray films. Chest is slightly coarse on the right. Heart regular without murmurs. No wheezing. Abdomen soft, nontender. No ankle edema today. Throat was clear. Weight loss was noted and discussed.  Assessment: Pulmonary mass, probably bronchogenic carcinoma COPD Shortness of breath Home oxygen Weight loss/cachexia Weakness Hilar adenopathy   Plan: Arrange for hospital bed Form for parking sticker Arrange for home health nurse Check labs Discussed possibility of hospice in the future 45 minutes of discussion

## 2014-06-23 ENCOUNTER — Telehealth: Payer: Self-pay

## 2014-06-23 NOTE — Telephone Encounter (Signed)
VO given for PT & OT 

## 2014-06-23 NOTE — Telephone Encounter (Signed)
Patient's daughter called and states that patient's ins company is going to require Dr. Linna Darner complete a prior auth for hospital bed that was ordered. Langley Gauss states she was given a phone number to [employer] company that Dr. Linna Darner is to call to receive the paperwork. That number is [phone].

## 2014-06-23 NOTE — Telephone Encounter (Signed)
Santiago Glad with [facility] is calling to get a verbal approval to continue occupational and physical therapy for this patient Please call karen with [facility] at [phone]

## 2014-06-24 ENCOUNTER — Other Ambulatory Visit: Payer: Self-pay | Admitting: Family Medicine

## 2014-06-24 NOTE — Telephone Encounter (Signed)
Dr. Linna Darner have you received this paperwork yet?

## 2014-06-25 ENCOUNTER — Telehealth: Payer: Self-pay

## 2014-06-25 DIAGNOSIS — J441 Chronic obstructive pulmonary disease with (acute) exacerbation: Secondary | ICD-10-CM

## 2014-06-25 DIAGNOSIS — R269 Unspecified abnormalities of gait and mobility: Secondary | ICD-10-CM

## 2014-06-25 NOTE — Telephone Encounter (Signed)
Pt's daughter called concerned about the recent home health care arrangements made for her father. He was just assigned a new nurse from [facility], however Mr. Schubring is already receiving PT and OT care from [facility]. His daughter was told that they can not be receiving care from both agencies so she would like [name] to correct this please. She also wants to know if she can suggest a different home health care agency.

## 2014-06-25 NOTE — Telephone Encounter (Signed)
Patients daughter wants to switch all Home Health Services to [facility]

## 2014-06-27 NOTE — Telephone Encounter (Signed)
I did this yesterday. 

## 2014-06-27 NOTE — Telephone Encounter (Signed)
Okay 

## 2014-06-29 NOTE — Telephone Encounter (Signed)
Patients daughter Langley Gauss calling to check the status of all patients therapy to be transferred to [facility]. Per daughter she lives in another state and is leaving Thursday. She wants to make sure her dad is taking care of before she leaves. Langley Gauss wants to know once his care is all transferred to [facility], does she call [facility] to cancel his treatments or does our office contact them. Patients daughter Langley Gauss call back number is [phone]

## 2014-06-30 NOTE — Telephone Encounter (Signed)
Pt needs to have PT/OT referral faxed to [facility]- [phone]. Pt is currently receiving therapy from [facility].

## 2014-06-30 NOTE — Telephone Encounter (Signed)
Spoke to [place] to advise the new order for PT/OT has been placed. She is going to call [facility] today to cancel the home visits and will call the intake at [facility] to make sure they start next week.

## 2014-07-03 ENCOUNTER — Inpatient Hospital Stay (HOSPITAL_COMMUNITY)
Admission: EM | Admit: 2014-07-03 | Discharge: 2014-07-07 | DRG: 193 | Disposition: A | Discharge status: Skilled Nursing Facility | Payer: Medicare Other | Attending: Internal Medicine | Admitting: Internal Medicine

## 2014-07-03 ENCOUNTER — Emergency Department (HOSPITAL_COMMUNITY): Payer: Medicare Other

## 2014-07-03 ENCOUNTER — Encounter (HOSPITAL_COMMUNITY): Payer: Self-pay | Admitting: Emergency Medicine

## 2014-07-03 ENCOUNTER — Emergency Department (HOSPITAL_COMMUNITY)
Admission: EM | Admit: 2014-07-03 | Discharge: 2014-07-03 | Disposition: A | Discharge status: Home / Self Care | Payer: Medicare Other | Attending: Emergency Medicine | Admitting: Emergency Medicine

## 2014-07-03 DIAGNOSIS — I5022 Chronic systolic (congestive) heart failure: Secondary | ICD-10-CM | POA: Diagnosis present

## 2014-07-03 DIAGNOSIS — R222 Localized swelling, mass and lump, trunk: Secondary | ICD-10-CM | POA: Insufficient documentation

## 2014-07-03 DIAGNOSIS — J441 Chronic obstructive pulmonary disease with (acute) exacerbation: Secondary | ICD-10-CM | POA: Diagnosis present

## 2014-07-03 DIAGNOSIS — J9611 Chronic respiratory failure with hypoxia: Secondary | ICD-10-CM | POA: Diagnosis present

## 2014-07-03 DIAGNOSIS — R5381 Other malaise: Secondary | ICD-10-CM | POA: Diagnosis present

## 2014-07-03 DIAGNOSIS — Z8546 Personal history of malignant neoplasm of prostate: Secondary | ICD-10-CM | POA: Diagnosis not present

## 2014-07-03 DIAGNOSIS — Z7982 Long term (current) use of aspirin: Secondary | ICD-10-CM | POA: Diagnosis not present

## 2014-07-03 DIAGNOSIS — Z87891 Personal history of nicotine dependence: Secondary | ICD-10-CM | POA: Insufficient documentation

## 2014-07-03 DIAGNOSIS — Z9861 Coronary angioplasty status: Secondary | ICD-10-CM

## 2014-07-03 DIAGNOSIS — Z9981 Dependence on supplemental oxygen: Secondary | ICD-10-CM | POA: Diagnosis not present

## 2014-07-03 DIAGNOSIS — I10 Essential (primary) hypertension: Secondary | ICD-10-CM | POA: Diagnosis present

## 2014-07-03 DIAGNOSIS — I2589 Other forms of chronic ischemic heart disease: Secondary | ICD-10-CM | POA: Diagnosis present

## 2014-07-03 DIAGNOSIS — L03213 Periorbital cellulitis: Secondary | ICD-10-CM

## 2014-07-03 DIAGNOSIS — E43 Unspecified severe protein-calorie malnutrition: Secondary | ICD-10-CM | POA: Diagnosis present

## 2014-07-03 DIAGNOSIS — J9601 Acute respiratory failure with hypoxia: Secondary | ICD-10-CM

## 2014-07-03 DIAGNOSIS — Z79899 Other long term (current) drug therapy: Secondary | ICD-10-CM

## 2014-07-03 DIAGNOSIS — E785 Hyperlipidemia, unspecified: Secondary | ICD-10-CM | POA: Insufficient documentation

## 2014-07-03 DIAGNOSIS — H02409 Unspecified ptosis of unspecified eyelid: Secondary | ICD-10-CM | POA: Diagnosis present

## 2014-07-03 DIAGNOSIS — I429 Cardiomyopathy, unspecified: Secondary | ICD-10-CM

## 2014-07-03 DIAGNOSIS — R918 Other nonspecific abnormal finding of lung field: Secondary | ICD-10-CM | POA: Diagnosis present

## 2014-07-03 DIAGNOSIS — Z515 Encounter for palliative care: Secondary | ICD-10-CM

## 2014-07-03 DIAGNOSIS — H00019 Hordeolum externum unspecified eye, unspecified eyelid: Secondary | ICD-10-CM | POA: Diagnosis not present

## 2014-07-03 DIAGNOSIS — J9620 Acute and chronic respiratory failure, unspecified whether with hypoxia or hypercapnia: Secondary | ICD-10-CM | POA: Diagnosis present

## 2014-07-03 DIAGNOSIS — I509 Heart failure, unspecified: Secondary | ICD-10-CM | POA: Diagnosis present

## 2014-07-03 DIAGNOSIS — Z681 Body mass index (BMI) 19 or less, adult: Secondary | ICD-10-CM

## 2014-07-03 DIAGNOSIS — R0602 Shortness of breath: Secondary | ICD-10-CM

## 2014-07-03 DIAGNOSIS — L03211 Cellulitis of face: Secondary | ICD-10-CM | POA: Diagnosis present

## 2014-07-03 DIAGNOSIS — R627 Adult failure to thrive: Secondary | ICD-10-CM | POA: Diagnosis present

## 2014-07-03 DIAGNOSIS — E876 Hypokalemia: Secondary | ICD-10-CM | POA: Diagnosis present

## 2014-07-03 DIAGNOSIS — J189 Pneumonia, unspecified organism: Secondary | ICD-10-CM | POA: Diagnosis not present

## 2014-07-03 DIAGNOSIS — Z66 Do not resuscitate: Secondary | ICD-10-CM | POA: Diagnosis present

## 2014-07-03 DIAGNOSIS — N184 Chronic kidney disease, stage 4 (severe): Secondary | ICD-10-CM | POA: Diagnosis present

## 2014-07-03 DIAGNOSIS — J9600 Acute respiratory failure, unspecified whether with hypoxia or hypercapnia: Secondary | ICD-10-CM

## 2014-07-03 DIAGNOSIS — I129 Hypertensive chronic kidney disease with stage 1 through stage 4 chronic kidney disease, or unspecified chronic kidney disease: Secondary | ICD-10-CM | POA: Diagnosis present

## 2014-07-03 DIAGNOSIS — L0201 Cutaneous abscess of face: Secondary | ICD-10-CM | POA: Diagnosis present

## 2014-07-03 LAB — PRO B NATRIURETIC PEPTIDE: PRO B NATRI PEPTIDE: 2758 pg/mL — AB (ref 0–450)

## 2014-07-03 LAB — BASIC METABOLIC PANEL
ANION GAP: 15 (ref 5–15)
BUN: 24 mg/dL — ABNORMAL HIGH (ref 6–23)
CO2: 25 mEq/L (ref 19–32)
CREATININE: 1.49 mg/dL — AB (ref 0.50–1.35)
Calcium: 9.5 mg/dL (ref 8.4–10.5)
Chloride: 103 mEq/L (ref 96–112)
GFR, EST AFRICAN AMERICAN: 48 mL/min — AB (ref >90)
GFR, EST NON AFRICAN AMERICAN: 41 mL/min — AB (ref >90)
Glucose, Bld: 99 mg/dL (ref 70–99)
POTASSIUM: 3.5 meq/L — AB (ref 3.7–5.3)
Sodium: 143 mEq/L (ref 137–147)

## 2014-07-03 LAB — CBC
HCT: 34.4 % — ABNORMAL LOW (ref 39.0–52.0)
HEMOGLOBIN: 11.5 g/dL — AB (ref 13.0–17.0)
MCH: 26.9 pg (ref 26.0–34.0)
MCHC: 33.4 g/dL (ref 30.0–36.0)
MCV: 80.6 fL (ref 78.0–100.0)
PLATELETS: 296 10*3/uL (ref 150–400)
RBC: 4.27 MIL/uL (ref 4.22–5.81)
RDW: 15.2 % (ref 11.5–15.5)
WBC: 6.1 10*3/uL (ref 4.0–10.5)

## 2014-07-03 LAB — TROPONIN I

## 2014-07-03 MED ORDER — ONDANSETRON HCL 4 MG PO TABS: 4.0000 mg | ORAL_TABLET | Freq: Four times a day (QID) | ORAL | Status: DC | PRN

## 2014-07-03 MED ORDER — FUROSEMIDE 40 MG PO TABS
40.0000 mg | ORAL_TABLET | Freq: Every day | ORAL | Status: DC
  Administered 2014-07-04 – 2014-07-07 (×4): 40 mg via ORAL
  Filled 2014-07-03 (×4): qty 1

## 2014-07-03 MED ORDER — GUAIFENESIN ER 600 MG PO TB12
600.0000 mg | ORAL_TABLET | Freq: Two times a day (BID) | ORAL | Status: DC
  Administered 2014-07-03 – 2014-07-07 (×8): 600 mg via ORAL
  Filled 2014-07-03 (×9): qty 1

## 2014-07-03 MED ORDER — ONDANSETRON HCL 4 MG/2ML IJ SOLN: 4.0000 mg | Freq: Four times a day (QID) | INTRAMUSCULAR | Status: DC | PRN

## 2014-07-03 MED ORDER — ALBUTEROL SULFATE (2.5 MG/3ML) 0.083% IN NEBU
2.5000 mg | INHALATION_SOLUTION | RESPIRATORY_TRACT | Status: DC | PRN
  Administered 2014-07-04 – 2014-07-07 (×6): 2.5 mg via RESPIRATORY_TRACT
  Filled 2014-07-03 (×6): qty 3

## 2014-07-03 MED ORDER — ENSURE COMPLETE PO LIQD: 237.0000 mL | Freq: Three times a day (TID) | ORAL | Status: DC

## 2014-07-03 MED ORDER — IPRATROPIUM-ALBUTEROL 0.5-2.5 (3) MG/3ML IN SOLN
3.0000 mL | Freq: Four times a day (QID) | RESPIRATORY_TRACT | Status: DC
  Administered 2014-07-03 – 2014-07-07 (×13): 3 mL via RESPIRATORY_TRACT
  Filled 2014-07-03 (×13): qty 3

## 2014-07-03 MED ORDER — ISOSORB DINITRATE-HYDRALAZINE 20-37.5 MG PO TABS
1.0000 | ORAL_TABLET | Freq: Three times a day (TID) | ORAL | Status: DC
  Administered 2014-07-03 – 2014-07-07 (×12): 1 via ORAL
  Filled 2014-07-03 (×14): qty 1

## 2014-07-03 MED ORDER — POTASSIUM CHLORIDE CRYS ER 20 MEQ PO TBCR
40.0000 meq | EXTENDED_RELEASE_TABLET | Freq: Once | ORAL | Status: AC
  Administered 2014-07-03: 40 meq via ORAL
  Filled 2014-07-03: qty 2

## 2014-07-03 MED ORDER — VANCOMYCIN HCL IN DEXTROSE 750-5 MG/150ML-% IV SOLN
750.0000 mg | INTRAVENOUS | Status: DC
  Administered 2014-07-04 – 2014-07-06 (×3): 750 mg via INTRAVENOUS
  Filled 2014-07-03 (×3): qty 150

## 2014-07-03 MED ORDER — VANCOMYCIN HCL 10 G IV SOLR
1500.0000 mg | Freq: Once | INTRAVENOUS | Status: AC
  Administered 2014-07-03: 1500 mg via INTRAVENOUS
  Filled 2014-07-03: qty 1500

## 2014-07-03 MED ORDER — ATORVASTATIN CALCIUM 20 MG PO TABS
20.0000 mg | ORAL_TABLET | Freq: Every day | ORAL | Status: DC
  Administered 2014-07-03 – 2014-07-06 (×4): 20 mg via ORAL
  Filled 2014-07-03 (×5): qty 1

## 2014-07-03 MED ORDER — DEXTROSE 5 % IV SOLN
2.0000 g | Freq: Once | INTRAVENOUS | Status: AC
  Administered 2014-07-03: 2 g via INTRAVENOUS
  Filled 2014-07-03: qty 2

## 2014-07-03 MED ORDER — SODIUM CHLORIDE 0.9 % IV SOLN: 250.0000 mL | INTRAVENOUS | Status: DC | PRN

## 2014-07-03 MED ORDER — DEXTROSE 5 % IV SOLN
500.0000 mg | Freq: Two times a day (BID) | INTRAVENOUS | Status: DC
  Administered 2014-07-04 – 2014-07-06 (×6): 500 mg via INTRAVENOUS
  Filled 2014-07-03 (×6): qty 0.5

## 2014-07-03 MED ORDER — ENOXAPARIN SODIUM 30 MG/0.3ML ~~LOC~~ SOLN
30.0000 mg | SUBCUTANEOUS | Status: DC
  Administered 2014-07-03 – 2014-07-06 (×4): 30 mg via SUBCUTANEOUS
  Filled 2014-07-03 (×5): qty 0.3

## 2014-07-03 MED ORDER — IPRATROPIUM-ALBUTEROL 18-103 MCG/ACT IN AERO: 1.0000 | INHALATION_SPRAY | RESPIRATORY_TRACT | Status: DC

## 2014-07-03 MED ORDER — ACETAMINOPHEN 325 MG PO TABS
650.0000 mg | ORAL_TABLET | Freq: Four times a day (QID) | ORAL | Status: DC | PRN
  Administered 2014-07-04: 650 mg via ORAL
  Filled 2014-07-03: qty 2

## 2014-07-03 MED ORDER — IPRATROPIUM-ALBUTEROL 0.5-2.5 (3) MG/3ML IN SOLN
3.0000 mL | Freq: Once | RESPIRATORY_TRACT | Status: AC
Start: 2014-07-03 — End: 2014-07-03
  Administered 2014-07-03: 3 mL via RESPIRATORY_TRACT

## 2014-07-03 MED ORDER — ASPIRIN EC 81 MG PO TBEC
81.0000 mg | DELAYED_RELEASE_TABLET | Freq: Every day | ORAL | Status: DC
  Administered 2014-07-04 – 2014-07-07 (×4): 81 mg via ORAL
  Filled 2014-07-03 (×4): qty 1

## 2014-07-03 MED ORDER — GUAIFENESIN-DM 100-10 MG/5ML PO SYRP: 5.0000 mL | ORAL_SOLUTION | ORAL | Status: DC | PRN

## 2014-07-03 MED ORDER — CETYLPYRIDINIUM CHLORIDE 0.05 % MT LIQD
7.0000 mL | Freq: Two times a day (BID) | OROMUCOSAL | Status: DC
  Administered 2014-07-03 – 2014-07-07 (×8): 7 mL via OROMUCOSAL

## 2014-07-03 MED ORDER — SODIUM CHLORIDE 0.9 % IJ SOLN: 3.0000 mL | INTRAMUSCULAR | Status: DC | PRN

## 2014-07-03 MED ORDER — CHLORHEXIDINE GLUCONATE 0.12 % MT SOLN
15.0000 mL | Freq: Two times a day (BID) | OROMUCOSAL | Status: DC
  Administered 2014-07-03 – 2014-07-07 (×8): 15 mL via OROMUCOSAL
  Filled 2014-07-03 (×10): qty 15

## 2014-07-03 MED ORDER — BISOPROLOL FUMARATE 5 MG PO TABS
5.0000 mg | ORAL_TABLET | Freq: Every day | ORAL | Status: DC
  Administered 2014-07-04 – 2014-07-07 (×4): 5 mg via ORAL
  Filled 2014-07-03 (×4): qty 1

## 2014-07-03 MED ORDER — METHYLPREDNISOLONE SODIUM SUCC 125 MG IJ SOLR
60.0000 mg | Freq: Three times a day (TID) | INTRAMUSCULAR | Status: DC
  Administered 2014-07-03 – 2014-07-04 (×2): 60 mg via INTRAVENOUS
  Filled 2014-07-03 (×5): qty 0.96

## 2014-07-03 MED ORDER — SODIUM CHLORIDE 0.9 % IJ SOLN
3.0000 mL | Freq: Two times a day (BID) | INTRAMUSCULAR | Status: DC
  Administered 2014-07-03 – 2014-07-07 (×4): 3 mL via INTRAVENOUS

## 2014-07-03 MED ORDER — IPRATROPIUM-ALBUTEROL 0.5-2.5 (3) MG/3ML IN SOLN
3.0000 mL | RESPIRATORY_TRACT | Status: DC
  Administered 2014-07-03: 3 mL via RESPIRATORY_TRACT
  Filled 2014-07-03: qty 3

## 2014-07-03 MED ORDER — ENSURE COMPLETE PO LIQD
237.0000 mL | Freq: Two times a day (BID) | ORAL | Status: DC
  Administered 2014-07-04 – 2014-07-07 (×7): 237 mL via ORAL

## 2014-07-03 MED ORDER — ACETAMINOPHEN 650 MG RE SUPP: 650.0000 mg | Freq: Four times a day (QID) | RECTAL | Status: DC | PRN

## 2014-07-03 MED ORDER — LEVOFLOXACIN 750 MG PO TABS: 750.0000 mg | ORAL_TABLET | Freq: Every day | ORAL | Status: DC

## 2014-07-03 NOTE — Progress Notes (Signed)
ANTIBIOTIC CONSULT NOTE - INITIAL  Pharmacy Consult for cefepime and vancomycin Indication: pneumonia  No Known Allergies  Patient Measurements: Height: 6' (182.9 cm) Weight: 132 lb (59.875 kg) IBW/kg (Calculated) : 77.6  Vital Signs: Temp: 97.6 F (36.4 C) (09/25 1205) Temp src: Oral (09/25 1205) BP: 118/60 mmHg (09/25 1153) Pulse Rate: 57 (09/25 1153)  Labs:  Recent Labs  07/03/14 0529  WBC 6.1  HGB 11.5*  PLT 296  CREATININE 1.49*   Estimated Creatinine Clearance: 30.7 ml/min (by C-G formula based on Cr of 1.49).  Medical History: Past Medical History  Diagnosis Date  . Hyperlipidemia   . Hypertension   . CHF (congestive heart failure)   . COPD (chronic obstructive pulmonary disease)   . Cancer     prostate  . Postsurgical percutaneous transluminal coronary angioplasty (PTCA) status   . S/P coronary artery stent placement     Medications:  Scheduled:  . vancomycin  1,500 mg Intravenous Once   Assessment: 28 yoM admitted 9/25 with acute worsening of chronic ShOB and hypoxia. Patient has Hx CKD4, COPD and is on home O2, also with recently diagnosed lung mass. Patient was seen in the ED earlier today, opacity was noted on CXR, and patient was discharged on levofloxacin and told to follow-up with his PCP. Patient then returned to the ED ~5 hours later with worsening symptoms and requesting admission. Noted patient with a recent admission 8/9-8/18 for CAP treated with ceftriaxone and azithromycin while inpatient, and finished with 8 more days of levofloxacin therapy. Pharmacy has been consulted to dose vancomycin and cefepime for HCAP.  Antiinfectives 9/25 >> cefepime >> 9/25 >> vancomycin >>    Labs / vitals Tmax: afebrile WBCs: WNL Renal: SCr 1.49 (appears at baseline), CrCl 30 ml/min CG, 36 ml/min normalized   Microbiology 9/25 blood x2: IP 9/25 Legionella Ur Ag: ordered  9/25 S pneumo Ur Ag: ordered 9/25 sputum: ordered   Goal of Therapy:   Vancomycin trough level 15-20 mcg/ml cefepime per indication and renal function  Plan:  - cefepime 2g IV x1 - cefepime 500mg  IV q12h to start 9/26 at 0200 - vancomycin 1500mg  IV x1 - vancomycin 750mg  IV q24h to start 9/26 at 1400 - vancomycin trough at steady state if indicated - follow-up clinical course, culture results, renal function - follow-up antibiotic de-escalation and length of therapy  Thank you for the consult.  Currie Paris, PharmD, BCPS Pager: [phone] Pharmacy: [phone] 07/03/2014 3:02 PM

## 2014-07-03 NOTE — ED Provider Notes (Signed)
CSN: [id number]     Arrival date & time 07/03/14  0415 History   First MD Initiated Contact with Patient 07/03/14 [phone]     Chief Complaint  Patient presents with  . Shortness of Breath     (Consider location/radiation/quality/duration/timing/severity/associated sxs/prior Treatment) Patient is a 80 y.o. male presenting with shortness of breath. The history is provided by the patient. No language interpreter was used.  Shortness of Breath Severity:  Moderate Onset quality:  Gradual Duration:  8 hours Timing:  Constant Progression:  Worsening Chronicity:  Chronic Relieved by:  Nothing Worsened by:  Nothing tried Ineffective treatments:  Inhaler Associated symptoms: cough   Associated symptoms: no abdominal pain, no chest pain, no fever, no headaches, no rash, no sputum production, no vomiting and no wheezing   Cough:    Cough characteristics:  Non-productive   Severity:  Moderate   Timing:  Constant   Progression:  Unchanged Risk factors comment:  COPD, CHF, lung mass which is likely bronchogenic carcinoma   Past Medical History  Diagnosis Date  . Hyperlipidemia   . Hypertension   . CHF (congestive heart failure)   . COPD (chronic obstructive pulmonary disease)   . Cancer     prostate  . Postsurgical percutaneous transluminal coronary angioplasty (PTCA) status   . S/P coronary artery stent placement    Past Surgical History  Procedure Laterality Date  . Radioactive seed implant    . Colon resection     No family history on file. History  Substance Use Topics  . Smoking status: Former Smoker -- 1.00 packs/day for 55 years    Types: Cigarettes    Quit date: 02/10/2003  . Smokeless tobacco: Never Used  . Alcohol Use: No    Review of Systems  Constitutional: Negative for fever, activity change, appetite change and fatigue.  HENT: Negative for congestion, facial swelling, rhinorrhea and trouble swallowing.   Eyes: Negative for photophobia and pain.  Respiratory:  Positive for cough and shortness of breath. Negative for sputum production, chest tightness and wheezing.   Cardiovascular: Negative for chest pain and leg swelling.  Gastrointestinal: Negative for nausea, vomiting, abdominal pain, diarrhea and constipation.  Endocrine: Negative for polydipsia and polyuria.  Genitourinary: Negative for dysuria, urgency, decreased urine volume and difficulty urinating.  Musculoskeletal: Negative for back pain and gait problem.  Skin: Negative for color change, rash and wound.  Allergic/Immunologic: Negative for immunocompromised state.  Neurological: Negative for dizziness, facial asymmetry, speech difficulty, weakness, numbness and headaches.  Psychiatric/Behavioral: Negative for confusion, decreased concentration and agitation.      Allergies  Review of patient's allergies indicates no known allergies.  Home Medications   Prior to Admission medications   Medication Sig Start Date End Date Taking? Authorizing Provider  albuterol-ipratropium (COMBIVENT) 18-103 MCG/ACT inhaler Inhale 1 puff into the lungs every 4 (four) hours.   Yes Historical Provider, MD  aspirin 81 MG tablet Take 81 mg by mouth daily.   Yes Historical Provider, MD  atorvastatin (LIPITOR) 20 MG tablet Take 20 mg by mouth daily.   Yes Historical Provider, MD  BIDIL 20-37.5 MG per tablet TAKE 1 TABLET BY MOUTH THREE TIMES DAILY 06/25/14  Yes Mancel Bale, PA-C  bisoprolol (ZEBETA) 5 MG tablet Take 1 tablet (5 mg total) by mouth daily. 05/26/14  Yes Oswald Hillock, MD  furosemide (LASIX) 40 MG tablet Take 40 mg by mouth daily.  05/26/14  Yes Oswald Hillock, MD  ipratropium-albuterol (DUONEB) 0.5-2.5 (3) MG/3ML SOLN Take  3 mLs by nebulization every 4 (four) hours as needed (shortness of breath). 05/26/14  Yes Oswald Hillock, MD  levofloxacin (LEVAQUIN) 750 MG tablet Take 1 tablet (750 mg total) by mouth daily. X 7 days 07/03/14   Ernestina Patches, MD   BP 164/70  Pulse 98  Temp(Src) 97.6 F (36.4 C)  (Oral)  Resp 27  SpO2 94% Physical Exam  Constitutional: He is oriented to person, place, and time. He appears cachectic. He appears ill. No distress.  HENT:  Head: Normocephalic and atraumatic.  Mouth/Throat: No oropharyngeal exudate.  Eyes: Pupils are equal, round, and reactive to light.  Neck: Normal range of motion. Neck supple.  Cardiovascular: Normal rate, regular rhythm and normal heart sounds.  Exam reveals no gallop and no friction rub.   No murmur heard. Pulmonary/Chest: Effort normal. No respiratory distress. He has decreased breath sounds in the right lower field and the left lower field. He has no wheezes. He has no rales.  Abdominal: Soft. Bowel sounds are normal. He exhibits no distension and no mass. There is no tenderness. There is no rebound and no guarding.  Musculoskeletal: Normal range of motion. He exhibits no edema and no tenderness.  Neurological: He is alert and oriented to person, place, and time.  Skin: Skin is warm and dry.  Psychiatric: He has a normal mood and affect.    ED Course  Procedures (including critical care time) Labs Review Labs Reviewed  BASIC METABOLIC PANEL - Abnormal; Notable for the following:    Potassium 3.5 (*)    BUN 24 (*)    Creatinine, Ser 1.49 (*)    GFR calc non Af Amer 41 (*)    GFR calc Af Amer 48 (*)    All other components within normal limits  CBC - Abnormal; Notable for the following:    Hemoglobin 11.5 (*)    HCT 34.4 (*)    All other components within normal limits  PRO B NATRIURETIC PEPTIDE - Abnormal; Notable for the following:    Pro B Natriuretic peptide (BNP) 2758.0 (*)    All other components within normal limits  TROPONIN I    Imaging Review No results found.   EKG Interpretation   Date/Time:  Friday July 03 2014 04:18:30 EDT Ventricular Rate:  69 PR Interval:  174 QRS Duration: 167 QT Interval:  440 QTC Calculation: 471 R Axis:   64 Text Interpretation:  Sinus rhythm IVCD, consider  atypical LBBB No  significant change since last tracing Confirmed by Foyil  ([id number]) on 07/03/2014 4:41:33 AM     <HTML><META HTTP-EQUIV="content-type" CONTENT="text/html;charset=utf-8">CLINICAL DATA: Shortness of breath which has increased over 24 hr<BR> <BR>EXAM:<BR>CHEST 2 VIEW<BR> <BR>COMPARISON: 06/19/2014<BR> <BR>FINDINGS:<BR>Mild cardiomegaly is stable from previous. There is mild aortic<BR>tortuosity.<BR> <BR>There is likely a background of interstitial lung disease. There are<BR>increasingly dense opacities in the right lung which are now coarse<BR>and nodular. Nodular density in the right upper lobe is again seen,<BR>a possible neoplasm as described on recent chest CT. No edema in the<BR>left lung. No significant effusion or pneumothorax.<BR> <BR>IMPRESSION:<BR>1. Increasing airspace disease on the right may reflect a new or<BR>progressive pneumonia.<BR>2. Right upper lobe nodule/mass, reference chest CT 06/19/2014.<BR> MDM   Final diagnoses:  Shortness of breath  Lung mass    Pt is a 80 y.o. male with Pmhx as above including her recently diagnosed lung mass which is likely bronchogenic carcinoma, who presents with acute worsening of his  chronic shortness of breath 8 hours ago. He says is a  chronic unchanged cough. He denies chest pain, fever, chills, nausea, vomiting, or extremity swelling. is been wearing 2 L home at all times since being discharged from the hospital 5-6 weeks ago. On physical exam patient appears in no acute distress. He is decreased breath sounds at bases. He is afebrile. His O2 sats drop to upper 80s with movement but recovered quickly.   EKG unchanged, trop neg, BNP elevated, but much improved from prior values. CXR with possible new or progressive pna. However, pt has no WBC elevation, no fever. He states he is feeling much improved than time of arrival after 2 duoneb treatments. We discussed inpt treatment vs close outpt f/u with his PCP. He would prefer  to go home, but agrees to return for worsening symptoms. Will start on 7d levaquin.    Ernestina Patches, MD 07/03/14 [phone]

## 2014-07-03 NOTE — ED Notes (Addendum)
Per EMS, pt has been having SOB since about 8hrs ago. Pt has hx of COPD and is on 2L [place] at home. Pt is speaking in full sentences and no wheezing noted, however, breath sounds are diminished bilaterally. Pt denies chest pain.

## 2014-07-03 NOTE — ED Notes (Signed)
Bed: DU20 Expected date:  Expected time:  Means of arrival:  Comments: EMS 80yo [facility]

## 2014-07-03 NOTE — ED Notes (Signed)
Per pt, states he left here this am with same symptoms, states it did not improve when he got home

## 2014-07-03 NOTE — ED Provider Notes (Signed)
80 y.o. Male with copd exacerbation.  Patient seen here this am and treated with albuterol and levaquin.  He wished to be discharged home, but after getting home had worsening dyspnea and returned.   WDWN mild respiratory distress Lungs decreased breaths sounds through out with some expiratory wheezing cv rrr   I saw and evaluated the patient, reviewed the resident's note and I agree with the findings and plan.   EKG Interpretation   Date/Time:  Friday July 03 2014 11:52:39 EDT Ventricular Rate:  52 PR Interval:  170 QRS Duration: 171 QT Interval:  467 QTC Calculation: 434 R Axis:   48 Text Interpretation:  Normal sinus rhythm Premature ventricular complexes  Left bundle branch block No significant change since last tracing  Confirmed by Maison Agrusa MD, Andee Poles [phone]) on 07/03/2014 1:15:36 PM        Shaune Pollack, MD 07/03/14 1316

## 2014-07-03 NOTE — ED Provider Notes (Signed)
CSN: [id number]     Arrival date & time 07/03/14  1138 History   First MD Initiated Contact with Patient 07/03/14 1145     Chief Complaint  Patient presents with  . Shortness of Breath     (Consider location/radiation/quality/duration/timing/severity/associated sxs/prior Treatment) HPI Comments: Patient comes back for worsening shortness of breath after evaluated in ED this morning for same complaint.  She has new lung mass likely bronchogenic carcinoma and end-stage COPD on 2 [place] oxygen.  Chest x-ray this morning showed likely pneumonia and patient opted for outpatient treatment with Levaquin.  However, when he returned home his breathing got worse and he came back desiring inpatient treatment.  He reports being recently treated in the hospital for CAP ~ one month ago.  Denies any chest pain, worsening lower surety, edema, fevers, but does endorse chills.   Patient is a 80 y.o. male presenting with shortness of breath. The history is provided by the patient and a relative.  Shortness of Breath Severity:  Moderate Onset quality:  Gradual Timing:  Constant Progression:  Worsening Chronicity:  Chronic Context: activity   Context: not URI   Relieved by:  Nothing Worsened by:  Activity and exertion Ineffective treatments:  Oxygen, inhaler and rest Associated symptoms: cough   Associated symptoms: no abdominal pain, no chest pain, no diaphoresis, no fever, no headaches, no hemoptysis, no PND, no sputum production, no syncope and no vomiting   Risk factors: hx of cancer   Risk factors comment:  COPD, CHF, lung mass which is likely bronchogenic carcinoma   Past Medical History  Diagnosis Date  . Hyperlipidemia   . Hypertension   . CHF (congestive heart failure)   . COPD (chronic obstructive pulmonary disease)   . Cancer     prostate  . Postsurgical percutaneous transluminal coronary angioplasty (PTCA) status   . S/P coronary artery stent placement    Past Surgical History  Procedure  Laterality Date  . Radioactive seed implant    . Colon resection     History reviewed. No pertinent family history. History  Substance Use Topics  . Smoking status: Former Smoker -- 1.00 packs/day for 55 years    Types: Cigarettes    Quit date: 02/10/2003  . Smokeless tobacco: Never Used  . Alcohol Use: No    Review of Systems  Constitutional: Positive for chills. Negative for fever, diaphoresis and fatigue.  Respiratory: Positive for cough and shortness of breath. Negative for hemoptysis, sputum production and chest tightness.   Cardiovascular: Negative for chest pain, palpitations, leg swelling, syncope and PND.  Gastrointestinal: Negative for nausea, vomiting, abdominal pain, diarrhea, constipation and blood in stool.  Musculoskeletal: Positive for myalgias.  Neurological: Negative for headaches.      Allergies  Review of patient's allergies indicates no known allergies.  Home Medications   Prior to Admission medications   Medication Sig Start Date End Date Taking? Authorizing Provider  albuterol-ipratropium (COMBIVENT) 18-103 MCG/ACT inhaler Inhale 1 puff into the lungs every 4 (four) hours.    Historical Provider, MD  aspirin 81 MG tablet Take 81 mg by mouth daily.    Historical Provider, MD  atorvastatin (LIPITOR) 20 MG tablet Take 20 mg by mouth daily.    Historical Provider, MD  BIDIL 20-37.5 MG per tablet TAKE 1 TABLET BY MOUTH THREE TIMES DAILY 06/25/14   Mancel Bale, PA-C  bisoprolol (ZEBETA) 5 MG tablet Take 1 tablet (5 mg total) by mouth daily. 05/26/14   Oswald Hillock, MD  furosemide (LASIX) 40 MG tablet Take 40 mg by mouth daily.  05/26/14   Oswald Hillock, MD  ipratropium-albuterol (DUONEB) 0.5-2.5 (3) MG/3ML SOLN Take 3 mLs by nebulization every 4 (four) hours as needed (shortness of breath). 05/26/14   Oswald Hillock, MD  levofloxacin (LEVAQUIN) 750 MG tablet Take 1 tablet (750 mg total) by mouth daily. X 7 days 07/03/14   Ernestina Patches, MD   BP 118/60  Pulse 57   Temp(Src) 97.6 F (36.4 C) (Oral)  Resp 26  SpO2 98% Physical Exam  Vitals reviewed. Constitutional: He is oriented to person, place, and time. He appears well-developed. No distress.  HENT:  Mouth/Throat: Oropharynx is clear and moist.  Eyes: Left eye exhibits discharge.  Cardiovascular: Regular rhythm, normal heart sounds and intact distal pulses.   Bradycardic  Pulmonary/Chest: No stridor. No respiratory distress. He has wheezes. He has rales.  Tachypnea on 3L [place] O2; decreased breath sounds bilaterally  Abdominal: Soft. He exhibits no distension. There is no tenderness.  Musculoskeletal: He exhibits no edema.  Neurological: He is alert and oriented to person, place, and time.  Skin: Skin is warm. No rash noted. He is not diaphoretic.    ED Course  Procedures (including critical care time) Labs Review Results for orders placed during the hospital encounter of 07/03/14 (from the past 24 hour(s))  BASIC METABOLIC PANEL     Status: Abnormal   Collection Time    07/03/14  5:29 AM      Result Value Ref Range   Sodium 143  137 - 147 mEq/L   Potassium 3.5 (*) 3.7 - 5.3 mEq/L   Chloride 103  96 - 112 mEq/L   CO2 25  19 - 32 mEq/L   Glucose, Bld 99  70 - 99 mg/dL   BUN 24 (*) 6 - 23 mg/dL   Creatinine, Ser 1.49 (*) 0.50 - 1.35 mg/dL   Calcium 9.5  8.4 - 10.5 mg/dL   GFR calc non Af Amer 41 (*) >90 mL/min   GFR calc Af Amer 48 (*) >90 mL/min   Anion gap 15  5 - 15  CBC     Status: Abnormal   Collection Time    07/03/14  5:29 AM      Result Value Ref Range   WBC 6.1  4.0 - 10.5 K/uL   RBC 4.27  4.22 - 5.81 MIL/uL   Hemoglobin 11.5 (*) 13.0 - 17.0 g/dL   HCT 34.4 (*) 39.0 - 52.0 %   MCV 80.6  78.0 - 100.0 fL   MCH 26.9  26.0 - 34.0 pg   MCHC 33.4  30.0 - 36.0 g/dL   RDW 15.2  11.5 - 15.5 %   Platelets 296  150 - 400 K/uL  PRO B NATRIURETIC PEPTIDE     Status: Abnormal   Collection Time    07/03/14  5:30 AM      Result Value Ref Range   Pro B Natriuretic peptide (BNP)  2758.0 (*) 0 - 450 pg/mL  TROPONIN I     Status: None   Collection Time    07/03/14  5:39 AM      Result Value Ref Range   Troponin I <0.30  <0.30 ng/mL    MDM   Final diagnoses:  HCAP (healthcare-associated pneumonia)   Patient comes in for worsening shortness of breath after her hospital treatment of CAP 1 month ago.  Has end-stage COPD on chronic oxygen and new lung mass likely  bronchogenic carcinoma.  Chest x-ray earlier today concerning for pneumonia.  Do to recent hospitalization and antibiotics for CAP .  Will admit and treat at [facility] with vancomycin and cefepime.  [place] O2 to maintain O2 sats between 88 and 92%.   Olam Idler, MD 07/03/14 [phone]

## 2014-07-03 NOTE — Discharge Instructions (Signed)
Pneumonia Pneumonia is an infection of the lungs.  CAUSES Pneumonia may be caused by bacteria or a virus. Usually, these infections are caused by breathing infectious particles into the lungs (respiratory tract). SIGNS AND SYMPTOMS   Cough.  Fever.  Chest pain.  Increased rate of breathing.  Wheezing.  Mucus production. DIAGNOSIS  If you have the common symptoms of pneumonia, your health care provider will typically confirm the diagnosis with a chest X-ray. The X-ray will show an abnormality in the lung (pulmonary infiltrate) if you have pneumonia. Other tests of your blood, urine, or sputum may be done to find the specific cause of your pneumonia. Your health care provider may also do tests (blood gases or pulse oximetry) to see how well your lungs are working. TREATMENT  Some forms of pneumonia may be spread to other people when you cough or sneeze. You may be asked to wear a mask before and during your exam. Pneumonia that is caused by bacteria is treated with antibiotic medicine. Pneumonia that is caused by the influenza virus may be treated with an antiviral medicine. Most other viral infections must run their course. These infections will not respond to antibiotics.  HOME CARE INSTRUCTIONS   Cough suppressants may be used if you are losing too much rest. However, coughing protects you by clearing your lungs. You should avoid using cough suppressants if you can.  Your health care provider may have prescribed medicine if he or she thinks your pneumonia is caused by bacteria or influenza. Finish your medicine even if you start to feel better.  Your health care provider may also prescribe an expectorant. This loosens the mucus to be coughed up.  Take medicines only as directed by your health care provider.  Do not smoke. Smoking is a common cause of bronchitis and can contribute to pneumonia. If you are a smoker and continue to smoke, your cough may last several weeks after your  pneumonia has cleared.  A cold steam vaporizer or humidifier in your room or home may help loosen mucus.  Coughing is often worse at night. Sleeping in a semi-upright position in a recliner or using a couple pillows under your head will help with this.  Get rest as you feel it is needed. Your body will usually let you know when you need to rest. PREVENTION A pneumococcal shot (vaccine) is available to prevent a common bacterial cause of pneumonia. This is usually suggested for:  People over 29 years old.  Patients on chemotherapy.  People with chronic lung problems, such as bronchitis or emphysema.  People with immune system problems. If you are over 65 or have a high risk condition, you may receive the pneumococcal vaccine if you have not received it before. In some countries, a routine influenza vaccine is also recommended. This vaccine can help prevent some cases of pneumonia.You may be offered the influenza vaccine as part of your care. If you smoke, it is time to quit. You may receive instructions on how to stop smoking. Your health care provider can provide medicines and counseling to help you quit. SEEK MEDICAL CARE IF: You have a fever. SEEK IMMEDIATE MEDICAL CARE IF:   Your illness becomes worse. This is especially true if you are elderly or weakened from any other disease.  You cannot control your cough with suppressants and are losing sleep.  You begin coughing up blood.  You develop pain which is getting worse or is uncontrolled with medicines.  Any of the symptoms  which initially brought you in for treatment are getting worse rather than better.  You develop shortness of breath or chest pain. MAKE SURE YOU:   Understand these instructions.  Will watch your condition.  Will get help right away if you are not doing well or get worse. Document Released: 09/25/2005 Document Revised: 02/09/2014 Document Reviewed: 12/15/2010 [facility] Patient Information 2015  [place], [place]. This information is not intended to replace advice given to you by your health care provider. Make sure you discuss any questions you have with your health care provider. Cough, Adult  A cough is a reflex that helps clear your throat and airways. It can help heal the body or may be a reaction to an irritated airway. A cough may only last 2 or 3 weeks (acute) or may last more than 8 weeks (chronic).  CAUSES Acute cough:  Viral or bacterial infections. Chronic cough:  Infections.  Allergies.  Asthma.  Post-nasal drip.  Smoking.  Heartburn or acid reflux.  Some medicines.  Chronic lung problems (COPD).  Cancer. SYMPTOMS   Cough.  Fever.  Chest pain.  Increased breathing rate.  High-pitched whistling sound when breathing (wheezing).  Colored mucus that you cough up (sputum). TREATMENT   A bacterial cough may be treated with antibiotic medicine.  A viral cough must run its course and will not respond to antibiotics.  Your caregiver may recommend other treatments if you have a chronic cough. HOME CARE INSTRUCTIONS   Only take over-the-counter or prescription medicines for pain, discomfort, or fever as directed by your caregiver. Use cough suppressants only as directed by your caregiver.  Use a cold steam vaporizer or humidifier in your bedroom or home to help loosen secretions.  Sleep in a semi-upright position if your cough is worse at night.  Rest as needed.  Stop smoking if you smoke. SEEK IMMEDIATE MEDICAL CARE IF:   You have pus in your sputum.  Your cough starts to worsen.  You cannot control your cough with suppressants and are losing sleep.  You begin coughing up blood.  You have difficulty breathing.  You develop pain which is getting worse or is uncontrolled with medicine.  You have a fever. MAKE SURE YOU:   Understand these instructions.  Will watch your condition.  Will get help right away if you are not doing well or  get worse. Document Released: 03/24/2011 Document Revised: 12/18/2011 Document Reviewed: 03/24/2011 [facility] Patient Information 2015 [place], [place]. This information is not intended to replace advice given to you by your health care provider. Make sure you discuss any questions you have with your health care provider.

## 2014-07-03 NOTE — ED Notes (Signed)
Respiratory at bedside.

## 2014-07-03 NOTE — Progress Notes (Signed)
INITIAL NUTRITION ASSESSMENT  DOCUMENTATION CODES Per approved criteria  -Severe malnutrition in the context of chronic illness -Underweight  Pt meets criteria for severe MALNUTRITION in the context of chronic illness as evidenced by 13% body weight loss in 6 months, PO intake < 75% for > one month.   INTERVENTION: -Recommend to modify Ensure Complete BID per pt preference -Recommend [employer] Breakfast once daily w/lunch -Recommend liberalizing diet to encourage PO intake -RD to continue to montior  NUTRITION DIAGNOSIS: Inadequate oral intake related to decreased appetite/weakness as evidenced by PO intake < 75%, 20 lb weight loss in 6 months.   Goal: Pt to meet >/= 90% of their estimated nutrition needs    Monitor:  Diet order, total protein/energy intake, labs, weights, supplement tolerance  Reason for Assessment: MST  80 y.o. male  Admitting Dx: COPD exacerbation  ASSESSMENT: 80 year old male with history of COPD recently discharged on 2 L home oxygen, chronic kidney disease stage IV, severe protein malnutrition, hypertension, recently diagnosed right upper lobe lung mass suspected to be bronchogenic carcinoma getting outpatient evaluation, ischemic cardiomyopathy with worsened EF on recent 2-D echo who presented to the ED with progressive shortness of breath for the past few days associated with productive cough with yellowish phlegm and occasionally mixed with small amount of blood.  -Pt reported ongoing weight loss for past 1.5-2 years, usual body weight had been around 175-180 lbs. It was unintentional and gradual at first, however has increased in severity the past 6 months. Has lost 22 lbs since 12/2013 (13% body weight loss, severe for time frame) -Diet recall indicates pt consuming one meal or less per day. Endorsed feelings of weakness, early satiety, and SOB that inhibit PO intake -Will drink Ensure occassionally; however not a regular daily regimen. Tried  drinking two-three per day per previous RD recommendations; however this induced loose stools and is only able to tolerate 1-2/day. Has tried Boost with similar results -Encouraged intake of [employer] Instant Breakfast as this may induce less GI symptoms. Pt verbalized agreement. Informed RN and placed order in [place] pt in meal ordering. Would likely have better PO intake with liberalized diet as pt appeared to have greater interest in consuming higher fat soups and sandwiches   Height: Ht Readings from Last 1 Encounters:  07/03/14 6' (1.829 m)    Weight: Wt Readings from Last 1 Encounters:  07/03/14 132 lb (59.875 kg)    Ideal Body Weight: 178 lbs  % Ideal Body Weight: 74%  Wt Readings from Last 10 Encounters:  07/03/14 132 lb (59.875 kg)  06/20/14 132 lb 13.6 oz (60.26 kg)  06/09/14 145 lb (65.772 kg)  06/01/14 141 lb (63.957 kg)  05/26/14 139 lb 5.3 oz (63.2 kg)  01/21/14 144 lb 6.4 oz (65.499 kg)  12/28/13 154 lb (69.854 kg)  12/06/13 161 lb (73.029 kg)  04/28/13 164 lb 12.8 oz (74.753 kg)  12/09/12 164 lb (74.39 kg)    Usual Body Weight: 155-165 lbs  % Usual Body Weight: 85%  BMI:  Body mass index is 17.9 kg/(m^2). Underweight  Estimated Nutritional Needs: Kcal: 1900-2100 Protein: 90-100 gram Fluid: >/=1900 ml daily  Skin: WDL  Diet Order: Cardiac  EDUCATION NEEDS: -Education needs addressed  No intake or output data in the 24 hours ending 07/03/14 1616  Last BM: 9/23   Labs:   Recent Labs Lab 07/03/14 0529  NA 143  K 3.5*  CL 103  CO2 25  BUN 24*  CREATININE 1.49*  CALCIUM  9.5  GLUCOSE 99    CBG (last 3)  No results found for this basename: GLUCAP,  in the last 72 hours  Scheduled Meds: . antiseptic oral rinse  7 mL Mouth Rinse q12n4p  . [START ON 07/04/2014] aspirin EC  81 mg Oral Daily  . atorvastatin  20 mg Oral q1800  . [START ON 07/04/2014] bisoprolol  5 mg Oral Daily  . [START ON 07/04/2014] ceFEPime (MAXIPIME) IV   500 mg Intravenous Q12H  . chlorhexidine  15 mL Mouth Rinse BID  . enoxaparin (LOVENOX) injection  30 mg Subcutaneous Q24H  . [START ON 07/04/2014] feeding supplement (ENSURE COMPLETE)  237 mL Oral BID BM  . [START ON 07/04/2014] furosemide  40 mg Oral Daily  . guaiFENesin  600 mg Oral BID  . ipratropium-albuterol  3 mL Nebulization Q4H  . isosorbide-hydrALAZINE  1 tablet Oral TID  . methylPREDNISolone (SOLU-MEDROL) injection  60 mg Intravenous 3 times per day  . potassium chloride  40 mEq Oral Once  . sodium chloride  3 mL Intravenous Q12H  . vancomycin  1,500 mg Intravenous Once  . [START ON 07/04/2014] vancomycin  750 mg Intravenous Q24H    Continuous Infusions:   Past Medical History  Diagnosis Date  . Hyperlipidemia   . Hypertension   . CHF (congestive heart failure)   . COPD (chronic obstructive pulmonary disease)   . Cancer     prostate  . Postsurgical percutaneous transluminal coronary angioplasty (PTCA) status   . S/P coronary artery stent placement     Past Surgical History  Procedure Laterality Date  . Radioactive seed implant    . Colon resection      Atlee Abide MS RD LDN Clinical Dietitian [phone]

## 2014-07-03 NOTE — Progress Notes (Signed)
UR completed 

## 2014-07-03 NOTE — ED Notes (Signed)
MD at bedside. 

## 2014-07-03 NOTE — H&P (Addendum)
[facility] History and Physical  Edward Nunez BLT:[id number] DOB: [birth year] DOA: 07/03/2014  Referring physician: Dr. Marcie Bal PCP: [name], MD   Chief Complaint:  Progressive dyspnea for past few days  HPI:  80 year old male with history of COPD recently discharged on 2 L home oxygen, chronic kidney disease stage IV, severe protein malnutrition, hypertension, recently diagnosed right upper lobe lung mass suspected to be bronchogenic carcinoma getting outpatient evaluation, ischemic cardiomyopathy with worsened EF on recent 2-D echo who presented to the ED with progressive shortness of breath for the past few days associated with productive  cough with yellowish phlegm and occasionally mixed with small amount of blood. Patient reports that he is hardly able to walk 10 steps without getting short of breath and for the last few days he has been short of breath even at rest. She reports occasional orthopnea but denies PND. He could not sleep last night as he was very short of breath and nebulizers at home did not help with his symptoms. His daughter increased the oxygen to 3 L but did not improve his symptoms so he came to the ED. In the ED he was given nebs and since his symptoms improved he was discharged home on 7 day course of Levaquin a chest x-ray showed possible new right-sided pneumonia. Patient however returned back in a few hours as he continued to be dyspneic. Patient denies headache, dizziness, fever, chills, nausea , vomiting, chest pain, palpitations, abdominal pain, bowel or urinary symptoms. Reports poor appetite and weight loss.  Course in the ED Patient was afebrile but tachypneic. Heart rate and blood pressure were normal. O2 sat was greater than 90% on 3 L via nasal cannula. Since patient is returned within a few hours with persistent dyspnea hospitalist admission was requested. Blood work done showed normal WBC, hemoglobin of 11.5, chemistry showed potassium of 2.5, BUN of  24 a creatinine of 1.49. Troponin x1 was negative. ProBNP of 2400 which actually appears better than recently.  Patient given IV cefepime and vancomycin with concern for healthcare associated pneumonia and admitted to medical floor.   Review of Systems:  Constitutional: Denies fever, chills, diaphoresis, appetite change and fatigue.  HEENT: Denies visual symptoms, trouble swallowing, neck pain. Reports a runny nose recently. Respiratory:  SOB, DOE, cough, hemoptysis, denies chest tightness,  and wheezing.   Cardiovascular: Denies chest pain, palpitations and leg swelling.  Gastrointestinal: Denies nausea, vomiting, abdominal pain, diarrhea, constipation, blood in stool and abdominal distention.  Genitourinary: Denies dysuria, urgency, frequency, hematuria, flank pain and difficulty urinating.  Endocrine: Denies: hot or cold intolerance, polyuria, polydipsia. Musculoskeletal: Denies myalgias, back pain, j and gait problem.  Skin: Denies pallor, rash and wound.  Neurological: Generalized weakness, Denies dizziness, seizures, syncope,  light-headedness, numbness and headaches.  Psychiatric/Behavioral: Denies confusion,   Past Medical History  Diagnosis Date  . Hyperlipidemia   . Hypertension   . CHF (congestive heart failure)   . COPD (chronic obstructive pulmonary disease)   . Cancer     prostate  . Postsurgical percutaneous transluminal coronary angioplasty (PTCA) status   . S/P coronary artery stent placement    Past Surgical History  Procedure Laterality Date  . Radioactive seed implant    . Colon resection     Social History:  reports that he quit smoking about 11 years ago. His smoking use included Cigarettes. He has a 55 pack-year smoking history. He has never used smokeless tobacco. He reports that he does not drink alcohol or use illicit  drugs.  No Known Allergies  History reviewed. No pertinent family history.  Prior to Admission medications   Medication Sig Start  Date End Date Taking? Authorizing Provider  albuterol-ipratropium (COMBIVENT) 18-103 MCG/ACT inhaler Inhale 1 puff into the lungs every 4 (four) hours.    Historical Provider, MD  aspirin 81 MG tablet Take 81 mg by mouth daily.    Historical Provider, MD  atorvastatin (LIPITOR) 20 MG tablet Take 20 mg by mouth daily.    Historical Provider, MD  BIDIL 20-37.5 MG per tablet TAKE 1 TABLET BY MOUTH THREE TIMES DAILY 06/25/14   Mancel Bale, PA-C  bisoprolol (ZEBETA) 5 MG tablet Take 1 tablet (5 mg total) by mouth daily. 05/26/14   Oswald Hillock, MD  furosemide (LASIX) 40 MG tablet Take 40 mg by mouth daily.  05/26/14   Oswald Hillock, MD  ipratropium-albuterol (DUONEB) 0.5-2.5 (3) MG/3ML SOLN Take 3 mLs by nebulization every 4 (four) hours as needed (shortness of breath). 05/26/14   Oswald Hillock, MD  levofloxacin (LEVAQUIN) 750 MG tablet Take 1 tablet (750 mg total) by mouth daily. X 7 days 07/03/14   Ernestina Patches, MD     Physical Exam:  Filed Vitals:   07/03/14 1153 07/03/14 1205 07/03/14 1241  BP: 118/60    Pulse: 57    Temp:  97.6 F (36.4 C)   TempSrc: Oral Oral   Resp: 26    Height:   6' (1.829 m)  Weight:   59.875 kg (132 lb)  SpO2: 98%      Constitutional: Vital signs reviewed.  Elderly thin built male in no acute distress HEENT: no pallor, no icterus, temporal wasting+, moist oral mucosa, no cervical lymphadenopathy Cardiovascular: RRR, S1 normal, S2 normal, 3/6 systolic murmur Chest: Diminished breath sounds over bilateral lung with fine right basilar crackles Abdominal: Soft. Non-tender, non-distended, bowel sounds are normal, Ext: warm, no edema Neurological: Alert and oriented  Labs on Admission:  Basic Metabolic Panel:  Recent Labs Lab 07/03/14 0529  NA 143  K 3.5*  CL 103  CO2 25  GLUCOSE 99  BUN 24*  CREATININE 1.49*  CALCIUM 9.5   Liver Function Tests: No results found for this basename: AST, ALT, ALKPHOS, BILITOT, PROT, ALBUMIN,  in the last 168 hours No  results found for this basename: LIPASE, AMYLASE,  in the last 168 hours No results found for this basename: AMMONIA,  in the last 168 hours CBC:  Recent Labs Lab 07/03/14 0529  WBC 6.1  HGB 11.5*  HCT 34.4*  MCV 80.6  PLT 296   Cardiac Enzymes:  Recent Labs Lab 07/03/14 0539  TROPONINI <0.30   BNP: No components found with this basename: POCBNP,  CBG: No results found for this basename: GLUCAP,  in the last 168 hours  Radiological Exams on Admission: No results found.  EKG: NSR with PVCs and LBBB, unchanged  Assessment/Plan  Principal Problem:   COPD exacerbation Admit to med surg Scheduled IV solumedrol 60 gm q 8r, scheduled duoneb every 4hrs and prn albuterol neb. 02 via [place] ( 3L currently) Add antitussives. Prn antibiotics. Resume home inhalers  Active Problems:  Healthcare associated pneumonia -As her respiratory symptoms and suggested on chest x-ray. start empiric vancomycin and cefepime. dosing per pharmacy -ordered blood cx. Sputum cx, urine for strep and legionella.  -02 via [place] prn. Continue albuterol and Atrovent nebs Supportive care with tylenol and antitussives.     Lung mass with ? Bronchogenic ca Recent CT  chest with contrast ( on 06/19/2014) shows small right upper lobe mass with right pleural effusion and lymphadenopathy suspicious for primary bronchogenic ca. recommend PET CT. As per pulmonary mote recently PET scan planned as outpt.  Given his  age, severe malnutrition, chronic respiratory failure and severe cardiomyopathy he is unlikely a candidate for any chemotherapy or radiation. May eventually need palliative care/hospice.    Cardiomyopathy Patient on recent hospitalization found to have acute on chronic systolic CHF with EF of 17%. Coreg was switched to bisoprolol. Patient not on ACE inhibitor due to renal insufficiency and placed on BiDil. Given his age cardiology suggested that he's not a candidate for ICD but did recommend cardiac  rehabilitation as outpatient. -Patient is currently euvolemic with no signs or symptoms of heart failure. Monitor daily weights and I /O  HTN (hypertension) Resume home meds    CKD (chronic kidney disease) stage 4, GFR 15-29 ml/min At baseline. Monitor    Protein-calorie malnutrition, severe On ensure at home which will be continued. Reports significant weight loss in  past 1 year     Hypokalemia replenish     Diet:cardiac  DVT prophylaxis: sq lovenox   Code Status: full code Family Communication: discussed with wife and daughter at bedside Disposition Plan: admit to med surg  Louellen Molder [facility] Pager [phone]  Total time spent on admission :70 minutes  If 7PM-7AM, please contact night-coverage [email or web address] Password [email or web address] 07/03/2014, 1:19 PM

## 2014-07-04 DIAGNOSIS — J9610 Chronic respiratory failure, unspecified whether with hypoxia or hypercapnia: Secondary | ICD-10-CM

## 2014-07-04 DIAGNOSIS — R0902 Hypoxemia: Secondary | ICD-10-CM

## 2014-07-04 DIAGNOSIS — I428 Other cardiomyopathies: Secondary | ICD-10-CM

## 2014-07-04 LAB — HIV ANTIBODY (ROUTINE TESTING W REFLEX): HIV 1&2 Ab, 4th Generation: NONREACTIVE

## 2014-07-04 LAB — STREP PNEUMONIAE URINARY ANTIGEN: STREP PNEUMO URINARY ANTIGEN: NEGATIVE

## 2014-07-04 LAB — LEGIONELLA ANTIGEN, URINE: Legionella Antigen, Urine: NEGATIVE

## 2014-07-04 MED ORDER — PREDNISONE 20 MG PO TABS
40.0000 mg | ORAL_TABLET | Freq: Every day | ORAL | Status: DC
  Administered 2014-07-05 – 2014-07-07 (×3): 40 mg via ORAL
  Filled 2014-07-04 (×4): qty 2

## 2014-07-04 NOTE — Progress Notes (Signed)
[facility] PROGRESS NOTE  Edward Nunez DOB: 1925-10-23 DOA: 07/03/2014 PCP: [name], MD  Assessment/Plan: Principal Problem:  Healthcare associated pneumonia  -  empiric vancomycin and cefepime. dosing per pharmacy  -follow blood cx. Sputum cx, urine for  legionella. Urine strep negative -02 via [place] prn. Continue albuterol and Atrovent nebs  Supportive care with tylenol and antitussives.  -symptoms improved this am and remains afebrile   Active Problems:  COPD exacerbation  scheduled duoneb every 4hrs and prn albuterol neb. 02 via [place] (on 2 L)  antitussives. Prn antibiotics.  Resume home inhalers  Transition to po prednisone    Lung mass with ? Bronchogenic ca  Recent CT chest with contrast ( on 06/19/2014) shows small right upper lobe mass with right pleural effusion and lymphadenopathy suspicious for primary bronchogenic ca. recommend PET CT. As per pulmonary mote recently PET scan planned as outpt. Given his age, severe malnutrition, chronic respiratory failure and severe cardiomyopathy he is unlikely a candidate for any chemotherapy or radiation. May eventually need palliative care/hospice.   Cardiomyopathy  Patient on recent hospitalization found to have acute on chronic systolic CHF with EF of 24%. Coreg was switched to bisoprolol. Patient not on ACE inhibitor due to renal insufficiency and placed on BiDil. Given his age cardiology suggested that he's not a candidate for ICD but did recommend cardiac rehabilitation as outpatient.  -Patient is currently euvolemic with no signs or symptoms of heart failure. Monitor daily weights and I /O  Continue ASA and statin   HTN (hypertension)  Continue home meds   CKD (chronic kidney disease) stage 4, GFR 15-29 ml/min  At baseline.   Protein-calorie malnutrition, severe  Continue ensure. Placed on regular diet.. Reports significant weight loss in past 1 year   Generalized Weakness  will obtain  PT   Hypokalemia  Replenished   Diet:regular  DVT prophylaxis: sq lovenox   Code Status: full code  Family Communication: none at [place]ide Disposition Plan: home on 9/27 if stable   HPI/Subjective: SOB and cough better this am  Objective: Filed Vitals:   07/04/14 0500  BP: 131/60  Pulse: 75  Temp: 97.7 F (36.5 C)  Resp: 20    Intake/Output Summary (Last 24 hours) at 07/04/14 1100 Last data filed at 07/04/14 0900  Gross per 24 hour  Intake    480 ml  Output      0 ml  Net    480 ml   Filed Weights   07/03/14 1241 07/04/14 0500  Weight: 59.875 kg (132 lb) 53.978 kg (119 lb)    Exam:   General:  NAD, cachetic  HEENT: , temporal wasting, moist mucosa  Chest: right basilar crackles with diminished breath sounds, no rhonchi or wheeze  Abd: sot, NT, ND,   EXT: warm, no edema  Data Reviewed: Basic Metabolic Panel:  Recent Labs Lab 07/03/14 0529  NA 143  K 3.5*  CL 103  CO2 25  GLUCOSE 99  BUN 24*  CREATININE 1.49*  CALCIUM 9.5   Liver Function Tests: No results found for this basename: AST, ALT, ALKPHOS, BILITOT, PROT, ALBUMIN,  in the last 168 hours No results found for this basename: LIPASE, AMYLASE,  in the last 168 hours No results found for this basename: AMMONIA,  in the last 168 hours CBC:  Recent Labs Lab 07/03/14 0529  WBC 6.1  HGB 11.5*  HCT 34.4*  MCV 80.6  PLT 296   Cardiac Enzymes:  Recent Labs Lab 07/03/14 0539  TROPONINI <0.30   BNP (last 3 results)  Recent Labs  05/17/14 0902 05/22/14 0407 07/03/14 0530  PROBNP 10162.0* 15127.0* 2758.0*   CBG: No results found for this basename: GLUCAP,  in the last 168 hours  No results found for this or any previous visit (from the past 240 hour(s)).   Studies: No results found.  Scheduled Meds: . antiseptic oral rinse  7 mL Mouth Rinse q12n4p  . aspirin EC  81 mg Oral Daily  . atorvastatin  20 mg Oral q1800  . bisoprolol  5 mg Oral Daily  . ceFEPime (MAXIPIME)  IV  500 mg Intravenous Q12H  . chlorhexidine  15 mL Mouth Rinse BID  . enoxaparin (LOVENOX) injection  30 mg Subcutaneous Q24H  . feeding supplement (ENSURE COMPLETE)  237 mL Oral BID BM  . furosemide  40 mg Oral Daily  . guaiFENesin  600 mg Oral BID  . ipratropium-albuterol  3 mL Nebulization Q6H  . isosorbide-hydrALAZINE  1 tablet Oral TID  . methylPREDNISolone (SOLU-MEDROL) injection  60 mg Intravenous 3 times per day  . sodium chloride  3 mL Intravenous Q12H  . vancomycin  750 mg Intravenous Q24H   Continuous Infusions:     Time spent: 25 minutes    Vishruth Seoane, [place]  [facility] Pager [phone] If 7PM-7AM, please contact night-coverage at [email or web address], password [facility] 07/04/2014, 11:00 AM  LOS: 1 day

## 2014-07-05 ENCOUNTER — Inpatient Hospital Stay (HOSPITAL_COMMUNITY): Payer: Medicare Other

## 2014-07-05 ENCOUNTER — Encounter (HOSPITAL_COMMUNITY): Payer: Self-pay | Admitting: Radiology

## 2014-07-05 DIAGNOSIS — R627 Adult failure to thrive: Secondary | ICD-10-CM

## 2014-07-05 DIAGNOSIS — E43 Unspecified severe protein-calorie malnutrition: Secondary | ICD-10-CM

## 2014-07-05 LAB — CREATININE, SERUM
CREATININE: 1.41 mg/dL — AB (ref 0.50–1.35)
GFR calc non Af Amer: 44 mL/min — ABNORMAL LOW (ref >90)
GFR, EST AFRICAN AMERICAN: 51 mL/min — AB (ref >90)

## 2014-07-05 MED ORDER — MEGESTROL ACETATE 400 MG/10ML PO SUSP
400.0000 mg | Freq: Two times a day (BID) | ORAL | Status: DC
  Administered 2014-07-05 – 2014-07-07 (×5): 400 mg via ORAL
  Filled 2014-07-05 (×6): qty 10

## 2014-07-05 NOTE — Evaluation (Signed)
Physical Therapy Evaluation Patient Details Name: Edward Nunez MRN: [id number] DOB: [birth year] Today's Date: 07/05/2014   History of Present Illness  80 year old male with history of COPD recently discharged on 2 L home oxygen, chronic kidney disease stage IV, severe protein malnutrition, hypertension, recently diagnosed right upper lobe lung mass suspected to be bronchogenic carcinoma, ischemic cardiomyopathy with worsened EF on recent 2-D echo who presented to the ED with progressive shortness of breath for the past few days and states he has felt very weak as well. chext Xray showing RLL pneumonia  Clinical Impression  Pt very weak and worried mostly about how short of breath he gets after very little mobility (10 feet or less). Discussed how this can improve slightly and he can feel a little stronger with little bouts of exercises, learning energy conservation and breathing exercises in order to hopefully transition back home. Pt and family worried at this time on pt's safety and ability to manage daily things in the home at this level of weakness. I agree. Feel pt would benefit from ST-SNF to improve on this a bit in order to hopefully transition home safely later.     Follow Up Recommendations SNF (short term snf)    Equipment Recommendations  None recommended by PT    Recommendations for Other Services       Precautions / Restrictions        Mobility  Bed Mobility Overal bed mobility: Needs Assistance Bed Mobility: Supine to Sit;Sit to Supine     Supine to sit: Min guard Sit to supine: Min guard      Transfers Overall transfer level: Needs assistance Equipment used: Rolling walker (2 wheeled) Transfers: Sit to/from Stand Sit to Stand: Min assist         General transfer comment: cvues for RW safety and help to rise  Ambulation/Gait Ambulation/Gait assistance: Min guard Ambulation Distance (Feet): 10 Feet Assistive device: Rolling walker (2 wheeled) Gait  Pattern/deviations: Step-through pattern;Narrow base of support     General Gait Details: slow and after very short distacne , very labored and pt reports this is waht makes it hard that he cannot =go very far before it takes 2-3 minutes to play "catch up " with his breathing after he sits down. I educated that this is how it will be,but we can try to keep the prufusion as good as possible with small bouts of mobility, exercies and educate with energy conservationa nd breathing exercises as well.   Stairs            Wheelchair Mobility    Modified Rankin (Stroke Patients Only)       Balance                                             Pertinent Vitals/Pain Pain Assessment: No/denies pain    Home Living Family/patient expects to be discharged to:: Private residence Living Arrangements: Spouse/significant other (lives with wife and dtr there (however works as well))   Type of Home: House Home Access: Stairs to enter   [employer] of Steps: 2 (has to walk up incline to get to this entrance.. this is very taxing for him he states) Home Layout: Two level;Bed/bath upstairs Home Equipment: Shower seat;Walker - 2 wheels Additional Comments: pt is quite independent and still drives at his baseline    Prior Function Level of Independence:  Independent (things have been very hard over last week or two and needed help )         Comments: used RW becasue very weak and SOB     Hand Dominance        Extremity/Trunk Assessment                         Communication   Communication: No difficulties  Cognition Arousal/Alertness: Awake/alert Behavior During Therapy: WFL for tasks assessed/performed Overall Cognitive Status: Within Functional Limits for tasks assessed                      General Comments      Exercises        Assessment/Plan    PT Assessment Patient needs continued PT services  PT Diagnosis  Generalized weakness   PT Problem List Decreased strength;Decreased activity tolerance;Decreased mobility;Decreased knowledge of use of DME  PT Treatment Interventions Gait training;Functional mobility training;Therapeutic activities;Therapeutic exercise;Patient/family education   PT Goals (Current goals can be found in the Care Plan section) Acute Rehab PT Goals Patient Stated Goal: to be able to get up and walk better without being so short of breath PT Goal Formulation: With patient/family Time For Goal Achievement: 07/12/14 Potential to Achieve Goals: Good    Frequency Min 3X/week   Barriers to discharge        Co-evaluation               End of Session Equipment Utilized During Treatment: Gait belt Activity Tolerance: Patient tolerated treatment well Patient left: in chair;with family/visitor present Nurse Communication: Mobility status         Time: 1230-1308 PT Time Calculation (min): 38 min   Charges:   PT Evaluation $Initial PT Evaluation Tier I: 1 Procedure PT Treatments $Gait Training: 8-22 mins $Therapeutic Activity: 8-22 mins   PT G CodesClide Dales 07/05/2014, 3:52 PM Clide Dales, PT Pager: [phone] 07/05/2014

## 2014-07-05 NOTE — Clinical Social Work Psychosocial (Signed)
     Clinical Social Work Department BRIEF PSYCHOSOCIAL ASSESSMENT 07/05/2014  Patient:  Edward Nunez, Edward Nunez     Account Number:  [id number]     Admit date:  07/03/2014  Clinical Social Worker:  Elam Dutch  Date/Time:  07/05/2014 02:45 PM  Referred by:  Physician  Date Referred:  07/05/2014 Referred for  SNF Placement   Other Referral:   Interview type:  Other - See comment Other interview type:   Patient and daughterEstill Nunez (in room)  Daughter Edward Nunez via phone    PSYCHOSOCIAL DATA Living Status:  WIFE Admitted from facility:   Level of care:   Primary support name:  Edward Nunez  (c) [phone] Primary support relationship to patient:  CHILD, ADULT Degree of support available:   Strong support    Has wife, daughters Edward Nunez and Edward Nunez for support as well. Strong family.    CURRENT CONCERNS Current Concerns  Post-Acute Placement   Other Concerns:    SOCIAL WORK ASSESSMENT / PLAN CSW met with patient and one of his daughters- Edward Nunez in his room today. Edward Nunez was able to have her sister Edward Nunez on the phone to participate in the discussion.  Patient currently lives at home with his wife and daughter Edward Nunez. Per MD- patient will require short term SNF. Patient and family currently agree with this d/c plan and bed search process discussed and initiated.  Patient/daughters do not have any specific placement preferences but hope to be able to tour several places to determin right "fit" for their father.  CSW stated that weekday CSW would provide them with bed offers asap so that they coudl choose a facility. Fl2 placed on chart for MD's signature.   Assessment/plan status:  Psychosocial Support/Ongoing Assessment of Needs Other assessment/ plan:   Information/referral to community resources:   SNF list provided to patient/daughter    PATIENTS/FAMILYS RESPONSE TO PLAN OF CARE: Patient is alert, oriented and very pleasant. He was noted to be sitting up in a recliner chair  during today's visit. Patient states that he is feeling better but he becomes extremely short of breath whenever he exerts himself.  He is aware of his weakness and feels that a short term rehab stay is indicated.  Daughters asked many questions about home care vs SNF placement. He currently has [facility] for Home O2 and PT/OT.  Daughter Edward Nunez will contact [facility] re: father's need for short term rehab and have them pick up his oxygen.  Active bed search initiated and daughters will await bed offers.  CSW servicies will assist with snf placement.

## 2014-07-05 NOTE — Progress Notes (Signed)
[facility] PROGRESS NOTE  Taimur Fier DDU:[id number] DOB: 17-Mar-1926 DOA: 07/03/2014 PCP: [name], MD   Brief narrative 80 year old male with history of COPD recently discharged on 2 L home oxygen, chronic kidney disease stage IV, severe protein malnutrition, hypertension, recently diagnosed right upper lobe lung mass suspected to be bronchogenic carcinoma getting outpatient evaluation, ischemic cardiomyopathy with worsened EF on recent 2-D echo who presented to the ED with progressive shortness of breath for the past few days associated with productive cough with yellowish phlegm and occasionally mixed with small amount of blood. Patient has generalized weakness and weight loss. chext Xray showing RLL pneumonia.   Assessment/Plan: Principal Problem:  Healthcare associated pneumonia  -  empiric vancomycin and cefepime. cx so far negative. urine for  legionella. Urine strep negative -02 via [place] prn. Continue albuterol and Atrovent nebs  Supportive care with tylenol and antitussives.  -remains afebrile     Active Problems:  COPD exacerbation  scheduled duoneb every 4hrs and prn albuterol neb. 02 via [place] (on 2 L)  home inhalers and po prednisone    Lung mass with ? Bronchogenic ca  Recent CT chest with contrast ( on 06/19/2014) shows small right upper lobe mass with right pleural effusion and lymphadenopathy suspicious for primary bronchogenic ca. recommend PET CT. As per pulmonary note recently PET scan planned as outpt. Given his age, severe malnutrition, chronic respiratory failure and severe cardiomyopathy he is unlikely a candidate for any chemotherapy or radiation. PCP recommends palliative care/ hospice. Dicussed with pt and his daughter and agree on having [place] meeting.  generalized weakness with deconditioning  seen by PT. Appears quite weak. Has services at home but gets tired and dyspneic with minimal ambulation. Agrees on going to SNF. Also discussed about GOC and  pt wishes to discuss with palliative care team about [place] and long term plan. Patient made aware that his long term prognosis is not good.  Cardiomyopathy  Patient on recent hospitalization found to have acute on chronic systolic CHF with EF of 23%. Coreg was switched to bisoprolol. Patient not on ACE inhibitor due to renal insufficiency and placed on BiDil. Given his age cardiology suggested that he's not a candidate for ICD but did recommend cardiac rehabilitation as outpatient.  -Patient is currently euvolemic with no signs or symptoms of heart failure. Monitor daily weights and I /O  Continue ASA and statin   HTN (hypertension)  Continue home meds   CKD (chronic kidney disease) stage 4, GFR 15-29 ml/min  At baseline.   Protein-calorie malnutrition, severe  Continue ensure. Placed on regular diet.. Reports significant weight loss in past 1 year and poor appetite. Will add megace for appetite stimulation.  Generalized Weakness Seen by PT. Appears deconditioned. SW consulted for SNF   Hypokalemia  Replenished  Left face swelling Noted for  left infraorbital puffiness and ptosis. No visual symptoms. Will obtain head and maxillofacial CT.  Diet:regular  DVT prophylaxis: sq lovenox   Code Status: full code  Family Communication: daughter at [place] Disposition Plan: SNF   HPI/Subjective: SOB and cough better but feels very weak and dyspnic on minimal exertion. Noted for left face swelling  Objective: Filed Vitals:   07/05/14 1049  BP: 165/68  Pulse: 68  Temp:   Resp:     Intake/Output Summary (Last 24 hours) at 07/05/14 1302 Last data filed at 07/05/14 1049  Gross per 24 hour  Intake    200 ml  Output    850 ml  Net   -  650 ml   Filed Weights   07/03/14 1241 07/04/14 0500 07/05/14 0615  Weight: 59.875 kg (132 lb) 53.978 kg (119 lb) 56.019 kg (123 lb 8 oz)    Exam:   General:  NAD, cachetic  HEENT: , temporal wasting, moist mucosa, left infraorbital  swelling with left eye ptosis  Chest:  diminished breath sounds on right , no rhonchi or wheeze  Abd: sot, NT, ND,   EXT: warm, no edema  CNS: alert and oriented  Data Reviewed: Basic Metabolic Panel:  Recent Labs Lab 07/03/14 0529 07/05/14 0542  NA 143  --   K 3.5*  --   CL 103  --   CO2 25  --   GLUCOSE 99  --   BUN 24*  --   CREATININE 1.49* 1.41*  CALCIUM 9.5  --    Liver Function Tests: No results found for this basename: AST, ALT, ALKPHOS, BILITOT, PROT, ALBUMIN,  in the last 168 hours No results found for this basename: LIPASE, AMYLASE,  in the last 168 hours No results found for this basename: AMMONIA,  in the last 168 hours CBC:  Recent Labs Lab 07/03/14 0529  WBC 6.1  HGB 11.5*  HCT 34.4*  MCV 80.6  PLT 296   Cardiac Enzymes:  Recent Labs Lab 07/03/14 0539  TROPONINI <0.30   BNP (last 3 results)  Recent Labs  05/17/14 0902 05/22/14 0407 07/03/14 0530  PROBNP 10162.0* 15127.0* 2758.0*   CBG: No results found for this basename: GLUCAP,  in the last 168 hours  Recent Results (from the past 240 hour(s))  CULTURE, BLOOD (ROUTINE X 2)     Status: None   Collection Time    07/03/14  1:41 PM      Result Value Ref Range Status   Specimen Description BLOOD RIGHT ANTECUBITAL   Final   Special Requests BOTTLES DRAWN AEROBIC AND ANAEROBIC 7 CC EA   Final   Culture  Setup Time     Final   Value: 07/03/2014 16:18     Performed at [facility]   Culture     Final   Value:        BLOOD CULTURE RECEIVED NO GROWTH TO DATE CULTURE WILL BE HELD FOR 5 DAYS BEFORE ISSUING A FINAL NEGATIVE REPORT     Performed at [facility]   Report Status PENDING   Incomplete  CULTURE, BLOOD (ROUTINE X 2)     Status: None   Collection Time    07/03/14  1:54 PM      Result Value Ref Range Status   Specimen Description BLOOD RIGHT HAND   Final   Special Requests BOTTLES DRAWN AEROBIC ONLY 3 CC   Final   Culture  Setup Time     Final   Value:  07/03/2014 16:17     Performed at [facility]   Culture     Final   Value:        BLOOD CULTURE RECEIVED NO GROWTH TO DATE CULTURE WILL BE HELD FOR 5 DAYS BEFORE ISSUING A FINAL NEGATIVE REPORT     Performed at [facility]   Report Status PENDING   Incomplete     Studies: No results found.  Scheduled Meds: . antiseptic oral rinse  7 mL Mouth Rinse q12n4p  . aspirin EC  81 mg Oral Daily  . atorvastatin  20 mg Oral q1800  . bisoprolol  5 mg Oral Daily  . ceFEPime (MAXIPIME) IV  500 mg Intravenous Q12H  . chlorhexidine  15 mL Mouth Rinse BID  . enoxaparin (LOVENOX) injection  30 mg Subcutaneous Q24H  . feeding supplement (ENSURE COMPLETE)  237 mL Oral BID BM  . furosemide  40 mg Oral Daily  . guaiFENesin  600 mg Oral BID  . ipratropium-albuterol  3 mL Nebulization Q6H  . isosorbide-hydrALAZINE  1 tablet Oral TID  . predniSONE  40 mg Oral Q breakfast  . sodium chloride  3 mL Intravenous Q12H  . vancomycin  750 mg Intravenous Q24H   Continuous Infusions:     Time spent: 25 minutes    Cutberto Winfree  [facility] Pager [phone] If 7PM-7AM, please contact night-coverage at [email or web address], password [facility] 07/05/2014, 1:02 PM  LOS: 2 days

## 2014-07-06 ENCOUNTER — Inpatient Hospital Stay (HOSPITAL_COMMUNITY): Payer: Medicare Other

## 2014-07-06 ENCOUNTER — Encounter (HOSPITAL_COMMUNITY): Payer: Self-pay | Admitting: Radiology

## 2014-07-06 ENCOUNTER — Ambulatory Visit (HOSPITAL_COMMUNITY): Payer: Medicare Other

## 2014-07-06 DIAGNOSIS — R222 Localized swelling, mass and lump, trunk: Secondary | ICD-10-CM

## 2014-07-06 DIAGNOSIS — Z515 Encounter for palliative care: Secondary | ICD-10-CM

## 2014-07-06 DIAGNOSIS — L0201 Cutaneous abscess of face: Secondary | ICD-10-CM

## 2014-07-06 DIAGNOSIS — L03211 Cellulitis of face: Secondary | ICD-10-CM

## 2014-07-06 DIAGNOSIS — L03213 Periorbital cellulitis: Secondary | ICD-10-CM | POA: Diagnosis not present

## 2014-07-06 MED ORDER — LORAZEPAM 0.5 MG PO TABS
0.5000 mg | ORAL_TABLET | Freq: Once | ORAL | Status: AC
  Administered 2014-07-06: 0.5 mg via ORAL
  Filled 2014-07-06: qty 1

## 2014-07-06 MED ORDER — SORBITOL 70 % SOLN
30.0000 mL | Freq: Every day | Status: DC | PRN
  Filled 2014-07-06: qty 30

## 2014-07-06 MED ORDER — IOHEXOL 300 MG/ML  SOLN
50.0000 mL | Freq: Once | INTRAMUSCULAR | Status: AC | PRN
  Administered 2014-07-06: 50 mL via INTRAVENOUS

## 2014-07-06 MED ORDER — CLINDAMYCIN PHOSPHATE 600 MG/50ML IV SOLN
600.0000 mg | Freq: Three times a day (TID) | INTRAVENOUS | Status: DC
  Administered 2014-07-06 – 2014-07-07 (×2): 600 mg via INTRAVENOUS
  Filled 2014-07-06 (×3): qty 50

## 2014-07-06 MED ORDER — LORAZEPAM 0.5 MG PO TABS: 0.5000 mg | ORAL_TABLET | Freq: Two times a day (BID) | ORAL | Status: DC | PRN

## 2014-07-06 MED ORDER — LEVOFLOXACIN 750 MG PO TABS
750.0000 mg | ORAL_TABLET | ORAL | Status: DC
Start: 2014-07-06 — End: 2014-07-07
  Administered 2014-07-06: 750 mg via ORAL
  Filled 2014-07-06: qty 1

## 2014-07-06 NOTE — Progress Notes (Signed)
Repeat CT of the orbit with contrast again shows preseptal   cellulitis with surrounding edema but no abscess and does comment on any abnormality around cavernous sinus area. Patient noted to have mucopurulent discharge from the left eye today which will be sent for culture. Denies visual changes. Will obtain opthalmology consult if not improved in am. abx switched to clindamycin or cellulitis and levaquin for HCAP.

## 2014-07-06 NOTE — Progress Notes (Signed)
Clinical Social Work  CSW met with patient, wife, dtr and son-in-law at bedside. Patient and family report they are still interested in SNF and CSW provided bed offers. Family aware of possible DC tomorrow and will review offers today and have a decision by the morning. Family had questions about VA insurance and CSW encouraged patient to talk with his case Freight forwarder at [place]. Family wanted CSW to apply for Medicaid for patient while he was in the hospital. CSW explained CSW could not apply for Medicaid for patient but provided family with Medicaid application. CSW will continue to follow and will assist as needed.  [place], [place] [phone]

## 2014-07-06 NOTE — Progress Notes (Signed)
Pt stated he was very sleepy and asked 3 times how I was doing.

## 2014-07-06 NOTE — Care Management Note (Signed)
    Page 1 of 1   07/06/2014     11:52:04 AM CARE MANAGEMENT NOTE 07/06/2014  Patient:  Edward Nunez, Edward Nunez   Account Number:  [id number]  Date Initiated:  07/06/2014  Documentation initiated by:  Sunday Spillers  Subjective/Objective Assessment:   80 yo male admitted with COPD exacerbation. PTA lived at home with spouse and daughter.     Action/Plan:   SNF for rehab   Anticipated DC Date:  07/06/2014   Anticipated DC Plan:  SKILLED NURSING FACILITY  In-house referral  Clinical Social Worker      DC Planning Services  CM consult      Choice offered to / List presented to:             Status of service:  Completed, signed off Medicare Important Message given?  YES (If response is "NO", the following Medicare IM given date fields will be blank) Date Medicare IM given:  07/06/2014 Medicare IM given by:  [facility] Date Additional Medicare IM given:   Additional Medicare IM given by:    Discharge Disposition:  [place]  Per UR Regulation:  Reviewed for med. necessity/level of care/duration of stay  If discussed at [place] of Stay Meetings, dates discussed:    Comments:

## 2014-07-06 NOTE — Consult Note (Signed)
Patient PY:[id number] Rossin      DOB: [birth year]      SNK:[id number]     Consult Note from the Palliative Medicine Team at [facility] Requested by: Dr. Clementeen Graham     PCP: [name], MD Reason for Consultation: [place]     Phone Number:[phone]  Assessment of patients Current state: I met today with Mr. Seibert, his wife, and daughter [name] with her husband. Mr. Fatzinger tells me that he understands and has discussed with Dr. Linna Darner that he knows chemotherapy/radiation "would not help him with all his ailments." He confirms he does not want to proceed with looking into any aggressive treatment options. They tell me they have not seen an oncologist but they do not feel this is necessary right now as they have their decision. Family is very concerned with disposition so we discussed SNF rehab with palliative to help with further options of long term care with hospice vs hospice facility. They tell me home is not an option. We discussed as the cancer progresses that we will continue to see decreased intake, worsening weakness, and more fatigue. We discussed decisions that may come into play for them. Mr. Colaizzi tells me that he would not want a feeding tube. He says that he would not want this based on the fact that "I do not think that would help, and I do not want to be a burden on my family." His primary concern is being a burden to his family. He also says he would not want to come back to the hospital unless it were something reversible that "will help." We discussed that this will need to be a conversation between patient, family, providers - including palliative/hospice to decide what is the right decision for him at the time. He does say he would want IV fluids if dehydrated - family understands that dehydration can become a recurrent issue with no long term solutions. They have many questions about finances and disposition options including long term care and VA benefits - I asked CSW to speak  with them about these options.    Goals of Care: 1.  Code Status: DNR   2. Scope of Treatment: Continue supportive care. Continue plan for SNF rehab with palliative as likely transition to hospice care. Focus is on comfort.     4. Disposition: SNF rehab with palliative.    3. Symptom Management:   1. Anxiety/Sleep: Lorazepam 0.5 mg po BID prn.  2. Bowel Regimen: Sorbitol 70% 30 mL daily prn.  3. Cough: Robitussin DM every 4 hours prn.  4. Malnutrition: Continue Megace. Ensure BID.  5. Fever: Acetaminophen prn.  6. Nausea/Vomiting: Ondansetron prn.   4. Psychosocial: Emotional support provided to patient and to family at bedside during difficult conversation.    Patient Documents Completed or Given: Document Given Completed  Advanced Directives Pkt    MOST    DNR    Gone from My [facility]ight    Hard Choices yes     Brief HPI: 80 yo male admitted with worsening dyspnea. Treated for health care associated pneumonia with history of COPD. Also found to have suspected bronchogenic cancer with right upper lobe lung mass seen on CT chest 06/19/14. He has been planned for outpatient PET scan. Concern that he is unlikely to be able to tolerate any chemotherapy/radiation treatments with his comorbitities. PMH significant for COPD 2L oxygen at home, chronic kidney disease stage IV, severe protein calorie malnutrition, CHF, hypertension, hyperlipidemia, prostate cancer.  ROS: Denies pain, nausea, anxiety. + sleep disturbance    PMH:  Past Medical History  Diagnosis Date  . Hyperlipidemia   . Hypertension   . CHF (congestive heart failure)   . COPD (chronic obstructive pulmonary disease)   . Cancer     prostate  . Postsurgical percutaneous transluminal coronary angioplasty (PTCA) status   . S/P coronary artery stent placement      PSH: Past Surgical History  Procedure Laterality Date  . Radioactive seed implant    . Colon resection     I have reviewed the FH and SH and  If  appropriate update it with new information. No Known Allergies Scheduled Meds: . antiseptic oral rinse  7 mL Mouth Rinse q12n4p  . aspirin EC  81 mg Oral Daily  . atorvastatin  20 mg Oral q1800  . bisoprolol  5 mg Oral Daily  . ceFEPime (MAXIPIME) IV  500 mg Intravenous Q12H  . chlorhexidine  15 mL Mouth Rinse BID  . enoxaparin (LOVENOX) injection  30 mg Subcutaneous Q24H  . feeding supplement (ENSURE COMPLETE)  237 mL Oral BID BM  . furosemide  40 mg Oral Daily  . guaiFENesin  600 mg Oral BID  . ipratropium-albuterol  3 mL Nebulization Q6H  . isosorbide-hydrALAZINE  1 tablet Oral TID  . megestrol  400 mg Oral BID  . predniSONE  40 mg Oral Q breakfast  . sodium chloride  3 mL Intravenous Q12H  . vancomycin  750 mg Intravenous Q24H   Continuous Infusions:  PRN Meds:.sodium chloride, acetaminophen, acetaminophen, albuterol, guaiFENesin-dextromethorphan, ondansetron (ZOFRAN) IV, ondansetron, sodium chloride    BP 131/69  Pulse 72  Temp(Src) 98.3 F (36.8 C) (Oral)  Resp 18  Ht 6' (1.829 m)  Wt 55.566 kg (122 lb 8 oz)  BMI 16.61 kg/m2  SpO2 92%   PPS: 30%   Intake/Output Summary (Last 24 hours) at 07/06/14 0842 Last data filed at 07/05/14 1813  Gross per 24 hour  Intake    460 ml  Output    950 ml  Net   -490 ml   LBM: 07/01/14                         Physical Exam:  General: NAD, thin, frail HEENT: Temporal muscle wasting, no JVD, left periordital swelling, moist mucous membranes Chest: Diminished right, no labored breathing CVS: RRR, S1 S2 Abdomen: Soft, NT, ND, +BS Ext: MAE, no edema, warm to touch Neuro: Awake, alert, oriented x 3, follows commands  Labs: CBC    Component Value Date/Time   WBC 6.1 07/03/2014 0529   WBC 7.6 06/01/2014 1827   RBC 4.27 07/03/2014 0529   RBC 4.28* 06/01/2014 1827   HGB 11.5* 07/03/2014 0529   HGB 11.4* 06/01/2014 1827   HCT 34.4* 07/03/2014 0529   HCT 36.8* 06/01/2014 1827   PLT 296 07/03/2014 0529   MCV 80.6 07/03/2014 0529    MCV 86.0 06/01/2014 1827   MCH 26.9 07/03/2014 0529   MCH 26.7* 06/01/2014 1827   MCHC 33.4 07/03/2014 0529   MCHC 31.0* 06/01/2014 1827   RDW 15.2 07/03/2014 0529   LYMPHSABS 1.0 05/22/2014 0407   MONOABS 0.7 05/22/2014 0407   EOSABS 0.2 05/22/2014 0407   BASOSABS 0.0 05/22/2014 0407    BMET    Component Value Date/Time   NA 143 07/03/2014 0529   K 3.5* 07/03/2014 0529   CL 103 07/03/2014 0529   CO2 25 07/03/2014  0529   GLUCOSE 99 07/03/2014 0529   BUN 24* 07/03/2014 0529   CREATININE 1.41* 07/05/2014 0542   CREATININE 1.88* 06/20/2014 1325   CALCIUM 9.5 07/03/2014 0529   GFRNONAA 44* 07/05/2014 0542   GFRNONAA 55* 12/06/2013 0931   GFRAA 51* 07/05/2014 0542   GFRAA 63 12/06/2013 0931    CMP     Component Value Date/Time   NA 143 07/03/2014 0529   K 3.5* 07/03/2014 0529   CL 103 07/03/2014 0529   CO2 25 07/03/2014 0529   GLUCOSE 99 07/03/2014 0529   BUN 24* 07/03/2014 0529   CREATININE 1.41* 07/05/2014 0542   CREATININE 1.88* 06/20/2014 1325   CALCIUM 9.5 07/03/2014 0529   PROT 7.0 06/01/2014 1824   ALBUMIN 3.2* 06/01/2014 1824   AST 16 06/01/2014 1824   ALT 13 06/01/2014 1824   ALKPHOS 80 06/01/2014 1824   BILITOT 0.4 06/01/2014 1824   GFRNONAA 44* 07/05/2014 0542   GFRNONAA 55* 12/06/2013 0931   GFRAA 51* 07/05/2014 0542   GFRAA 63 12/06/2013 0931     Time In Time Out Total Time Spent with Patient Total Overall Time  0900 1030 30mn 953m    Greater than 50%  of this time was spent counseling and coordinating care related to the above assessment and plan.  AlVinie SillNP Palliative Medicine Team Pager # [phone]-F 8a-5p) Team Phone # [phone]/Weekends)

## 2014-07-06 NOTE — Progress Notes (Signed)
ANTIBIOTIC CONSULT NOTE - follow up  Pharmacy Consult for Levaquin Indication: pneumonia  No Known Allergies  Patient Measurements: Height: 6' (182.9 cm) Weight: 122 lb 8 oz (55.566 kg) IBW/kg (Calculated) : 77.6  Vital Signs: Temp: 98 F (36.7 C) (09/28 1300) Temp src: Oral (09/28 1300) BP: 125/69 mmHg (09/28 1300) Pulse Rate: 65 (09/28 1300)  Labs:  Recent Labs  07/05/14 0542  CREATININE 1.41*   Estimated Creatinine Clearance: 30.1 ml/min (by C-G formula based on Cr of 1.41).  Assessment: 40 yoM admitted 9/25 with acute worsening of chronic ShOB and hypoxia, initially started on vancomycin and cefepime for HCAP.  Pharmacy is now consulted to change to oral Levaquin.  MD has also added clindamycin for cellulitis.   Antiinfectives: 9/25 >> cefepime >> 9/28 9/25 >> vancomycin >> 9/28 9/28 >> Levaquin >> 9/28 >> clindamycin >>  Labs / vitals:  Tmax: afebrile WBCs: WNL Renal: SCr 1.49 (appears at baseline), CrCl 30 ml/min CG, 36 ml/min normalized   Goal of Therapy:  Appropriate abx dosing, eradication of infection.   Plan:   Levaquin 750mg  PO q48h  Follow up renal function, clinical condition, and cultures as available.  Thank you for the consult.  Gretta Arab PharmD, BCPS Pager [phone] 07/06/2014 6:30 PM

## 2014-07-06 NOTE — Progress Notes (Signed)
PT Cancellation Note  Patient Details Name: Kyland No MRN: [id number] DOB: [birth year]   Cancelled Treatment:    Reason Eval/Treat Not Completed: Patient at procedure or test/unavailable (getting CT scan. Will check back another day. Thanks. )   Weston Anna, MPT Pager: [phone]

## 2014-07-06 NOTE — Progress Notes (Signed)
[facility] PROGRESS NOTE  Artez Regis YIR:[id number] DOB: 12-18-1925 DOA: 07/03/2014 PCP: [name], MD   Brief narrative 80 year old male with history of COPD recently discharged on 2 L home oxygen, chronic kidney disease stage IV, severe protein malnutrition, hypertension, recently diagnosed right upper lobe lung mass suspected to be bronchogenic carcinoma getting outpatient evaluation, ischemic cardiomyopathy with worsened EF on recent 2-D echo who presented to the ED with progressive shortness of breath for the past few days associated with productive cough with yellowish phlegm and occasionally mixed with small amount of blood. Patient has generalized weakness and weight loss. chext Xray showing RLL pneumonia.   Assessment/Plan: Principal Problem:  Healthcare associated pneumonia  -  empiric vancomycin and cefepime. cx so far negative. urine for  Legionella and  strep negative -02 via [place] prn. Continue albuterol and Atrovent nebs . patient gets dyspneic on minimal exertion. Possibly from COPD, CHF and deconditioning . Supportive care with tylenol and antitussives.  -remains afebrile     Active Problems:  COPD with chr resp failure scheduled duoneb every 4hrs and prn albuterol neb. 02 via [place] (on 2 L)  home inhalers and po prednisone  Left periorbital cellulitis  noticed since 9/26. CT of orbits w/o contrast on 9/27 shows cellulitis with no abscess. clinically worsened today with  increased ptosis. Will obtain CT with contrast to evaluate for any abscess or cavernous sinus thrombosis. No pain or blurred vision . No headache , N/ V. On empiric abx already.     Lung mass with ? Bronchogenic ca  Recent CT chest with contrast ( on 06/19/2014) shows small right upper lobe mass with right pleural effusion and lymphadenopathy suspicious for primary bronchogenic ca. recommend PET CT. As per pulmonary note recently PET scan planned as outpt. Given his age, severe malnutrition,  chronic respiratory failure and severe cardiomyopathy he is unlikely a candidate for any chemotherapy or radiation. PCP recommends palliative care/ hospice.  [place] meeting with palliative care team this morning ( pt, his wife and one of the daughters). Agrees on going to short term SNF and palliative care follow up there. Agrees on hospice in the long run. Patient made DNR as he doesn't wish for any active resuscitation.   generalized weakness with deconditioning Secondary to underlying conditions. Seen by PT and recommends short term rehab.    Cardiomyopathy  Patient on recent hospitalization found to have acute on chronic systolic CHF with EF of 50%. Coreg was switched to bisoprolol. Patient not on ACE inhibitor due to renal insufficiency and placed on BiDil. Given his age cardiology suggested that he's not a candidate for ICD but did recommend cardiac rehabilitation as outpatient.  -Patient is currently euvolemic with no signs or symptoms of heart failure. Monitor daily weights and I /O  Continue ASA and statin   HTN (hypertension)  Continue home meds   CKD (chronic kidney disease) stage 4, GFR 15-29 ml/min  At baseline.   Protein-calorie malnutrition, severe  Continue ensure. Placed on regular diet.. Reports significant weight loss in past 1 year and poor appetite.  added megace for appetite stimulation.   Hypokalemia  Replenished   Diet:regular  DVT prophylaxis: sq lovenox   Code Status: full code  Family Communication: wife and  daughter at bedside Disposition Plan: SNF possibly on 9/29   HPI/Subjective: Pt dyspnic overnight. CXR uncharged. Given ativan this am for ? Dyspnea and was quite confused and sleepy. Worsened lefy eye puffiness  Objective: Filed Vitals:   07/06/14 0455  BP:  131/69  Pulse: 72  Temp: 98.3 F (36.8 C)  Resp: 18    Intake/Output Summary (Last 24 hours) at 07/06/14 1406 Last data filed at 07/06/14 1145  Gross per 24 hour  Intake    600 ml   Output    650 ml  Net    -50 ml   Filed Weights   07/04/14 0500 07/05/14 0615 07/06/14 0455  Weight: 53.978 kg (119 lb) 56.019 kg (123 lb 8 oz) 55.566 kg (122 lb 8 oz)    Exam:   General:  NAD, cachetic  HEENT: , temporal wasting, moist mucosa, left infraorbital swelling with left eye ptosis ( worsened today)  Chest:  diminished breath sounds on right , no rhonchi or wheeze  Abd: sot, NT, ND,   EXT: warm, no edema  CNS: alert and oriented  Data Reviewed: Basic Metabolic Panel:  Recent Labs Lab 07/03/14 0529 07/05/14 0542  NA 143  --   K 3.5*  --   CL 103  --   CO2 25  --   GLUCOSE 99  --   BUN 24*  --   CREATININE 1.49* 1.41*  CALCIUM 9.5  --    Liver Function Tests: No results found for this basename: AST, ALT, ALKPHOS, BILITOT, PROT, ALBUMIN,  in the last 168 hours No results found for this basename: LIPASE, AMYLASE,  in the last 168 hours No results found for this basename: AMMONIA,  in the last 168 hours CBC:  Recent Labs Lab 07/03/14 0529  WBC 6.1  HGB 11.5*  HCT 34.4*  MCV 80.6  PLT 296   Cardiac Enzymes:  Recent Labs Lab 07/03/14 0539  TROPONINI <0.30   BNP (last 3 results)  Recent Labs  05/17/14 0902 05/22/14 0407 07/03/14 0530  PROBNP 10162.0* 15127.0* 2758.0*   CBG: No results found for this basename: GLUCAP,  in the last 168 hours  Recent Results (from the past 240 hour(s))  CULTURE, BLOOD (ROUTINE X 2)     Status: None   Collection Time    07/03/14  1:41 PM      Result Value Ref Range Status   Specimen Description BLOOD RIGHT ANTECUBITAL   Final   Special Requests BOTTLES DRAWN AEROBIC AND ANAEROBIC 7 CC EA   Final   Culture  Setup Time     Final   Value: 07/03/2014 16:18     Performed at [facility]   Culture     Final   Value:        BLOOD CULTURE RECEIVED NO GROWTH TO DATE CULTURE WILL BE HELD FOR 5 DAYS BEFORE ISSUING A FINAL NEGATIVE REPORT     Performed at [facility]   Report Status PENDING    Incomplete  CULTURE, BLOOD (ROUTINE X 2)     Status: None   Collection Time    07/03/14  1:54 PM      Result Value Ref Range Status   Specimen Description BLOOD RIGHT HAND   Final   Special Requests BOTTLES DRAWN AEROBIC ONLY 3 CC   Final   Culture  Setup Time     Final   Value: 07/03/2014 16:17     Performed at [facility]   Culture     Final   Value:        BLOOD CULTURE RECEIVED NO GROWTH TO DATE CULTURE WILL BE HELD FOR 5 DAYS BEFORE ISSUING A FINAL NEGATIVE REPORT     Performed at [facility]  Report Status PENDING   Incomplete     Studies: Ct Head Wo Contrast  07/05/2014   CLINICAL DATA:  Left infraorbital puffiness and ptosis. No visual symptoms.  EXAM: CT HEAD AND ORBITS WITHOUT CONTRAST  TECHNIQUE: Contiguous axial images were obtained from the base of the skull through the vertex without contrast. Multidetector CT imaging of the orbits was performed using the standard protocol without intravenous contrast.  COMPARISON:  None.  FINDINGS: CT HEAD FINDINGS  There is no evidence of acute intracranial hemorrhage, mass lesion, brain edema or extra-axial fluid collection. The ventricles and subarachnoid spaces are diffusely prominent consistent with age-appropriate atrophy. There is no CT evidence of acute cortical infarction. Patchy small vessel ischemic changes are present within the periventricular white matter and basal ganglia.  There is mild mucosal thickening in the left frontal sinus. The additional paranasal sinuses, mastoid air cells and middle ears appear clear. The calvarium is intact.  CT ORBITS FINDINGS  There is asymmetric periorbital soft tissue thickening on the left which appears fairly diffuse. No focal fluid collection, foreign body or soft tissue emphysema identified. There are no inflammatory changes within the postseptal fat. The globes are intact and appear symmetrically position. There is no thickening of the extra-ocular muscles. The optic nerves  appear normal.  In addition to the mild mucosal thickening in the left frontal sinus, there is mild mucosal thickening in the right maxillary sinus. There are no air-fluid levels. There is no evidence of acute fracture or bone destruction.  IMPRESSION: 1. Preseptal periorbital soft tissue swelling on the left, likely cellulitis. Correlate clinically. 2. No evidence of postseptal inflammation, orbital emphysema or foreign body. 3. No evidence of active sinusitis. There is mild mucosal thickening in the left frontal and right maxillary sinuses. 4. No acute intracranial findings.  Age-appropriate atrophy.   Electronically Signed   By: Camie Patience M.D.   On: 07/05/2014 17:25   Dg Chest Port 1 View  07/06/2014   CLINICAL DATA:  Dyspnea.  EXAM: PORTABLE CHEST - 1 VIEW  COMPARISON:  07/03/2014  FINDINGS: Cardiac enlargement. Pulmonary vascularity is normal. Hyperinflation suggesting emphysematous change. Diffuse fibrosis and bronchiectasis throughout the lungs. Volume loss in the right lung with superimposed airspace disease and probable small effusion. Changes may represent pneumonia. Similar appearance to previous study. Right apical pleural thickening/mass as seen on previous CT.  IMPRESSION: Emphysematous changes and fibrosis throughout both lungs with superimposed infiltrative changes on the right. Right pleural effusion. Changes may represent pneumonia. Right apical pleural mass/ thickening as seen previously on CT.   Electronically Signed   By: Lucienne Capers M.D.   On: 07/06/2014 05:18   Ct Orbitss W/o Cm  07/05/2014   CLINICAL DATA:  Left infraorbital puffiness and ptosis. No visual symptoms.  EXAM: CT HEAD AND ORBITS WITHOUT CONTRAST  TECHNIQUE: Contiguous axial images were obtained from the base of the skull through the vertex without contrast. Multidetector CT imaging of the orbits was performed using the standard protocol without intravenous contrast.  COMPARISON:  None.  FINDINGS: CT HEAD FINDINGS   There is no evidence of acute intracranial hemorrhage, mass lesion, brain edema or extra-axial fluid collection. The ventricles and subarachnoid spaces are diffusely prominent consistent with age-appropriate atrophy. There is no CT evidence of acute cortical infarction. Patchy small vessel ischemic changes are present within the periventricular white matter and basal ganglia.  There is mild mucosal thickening in the left frontal sinus. The additional paranasal sinuses, mastoid air cells and middle ears  appear clear. The calvarium is intact.  CT ORBITS FINDINGS  There is asymmetric periorbital soft tissue thickening on the left which appears fairly diffuse. No focal fluid collection, foreign body or soft tissue emphysema identified. There are no inflammatory changes within the postseptal fat. The globes are intact and appear symmetrically position. There is no thickening of the extra-ocular muscles. The optic nerves appear normal.  In addition to the mild mucosal thickening in the left frontal sinus, there is mild mucosal thickening in the right maxillary sinus. There are no air-fluid levels. There is no evidence of acute fracture or bone destruction.  IMPRESSION: 1. Preseptal periorbital soft tissue swelling on the left, likely cellulitis. Correlate clinically. 2. No evidence of postseptal inflammation, orbital emphysema or foreign body. 3. No evidence of active sinusitis. There is mild mucosal thickening in the left frontal and right maxillary sinuses. 4. No acute intracranial findings.  Age-appropriate atrophy.   Electronically Signed   By: Camie Patience M.D.   On: 07/05/2014 17:25    Scheduled Meds: . antiseptic oral rinse  7 mL Mouth Rinse q12n4p  . aspirin EC  81 mg Oral Daily  . atorvastatin  20 mg Oral q1800  . bisoprolol  5 mg Oral Daily  . ceFEPime (MAXIPIME) IV  500 mg Intravenous Q12H  . chlorhexidine  15 mL Mouth Rinse BID  . enoxaparin (LOVENOX) injection  30 mg Subcutaneous Q24H  . feeding  supplement (ENSURE COMPLETE)  237 mL Oral BID BM  . furosemide  40 mg Oral Daily  . guaiFENesin  600 mg Oral BID  . ipratropium-albuterol  3 mL Nebulization Q6H  . isosorbide-hydrALAZINE  1 tablet Oral TID  . megestrol  400 mg Oral BID  . predniSONE  40 mg Oral Q breakfast  . sodium chloride  3 mL Intravenous Q12H  . vancomycin  750 mg Intravenous Q24H   Continuous Infusions:     Time spent: 25 minutes    Rylen Hou  [facility] Pager [phone] If 7PM-7AM, please contact night-coverage at [email or web address], password [facility] 07/06/2014, 2:06 PM  LOS: 3 days

## 2014-07-06 NOTE — Progress Notes (Signed)
Clinical Social Work  CSW went to room to provide bed offers. PMT NP currently meeting with patient and family. CSW will follow up at later time.  [place], [place] [phone]

## 2014-07-06 NOTE — Progress Notes (Signed)
ANTIBIOTIC CONSULT NOTE - follow up  Pharmacy Consult for cefepime and vancomycin Indication: pneumonia  No Known Allergies  Patient Measurements: Height: 6' (182.9 cm) Weight: 122 lb 8 oz (55.566 kg) IBW/kg (Calculated) : 77.6  Vital Signs: Temp: 98.3 F (36.8 C) (09/28 0455) Temp src: Oral (09/28 0455) BP: 131/69 mmHg (09/28 0455) Pulse Rate: 72 (09/28 0455)  Labs:  Recent Labs  07/05/14 0542  CREATININE 1.41*   Estimated Creatinine Clearance: 30.1 ml/min (by C-G formula based on Cr of 1.41).  Medical History: Past Medical History  Diagnosis Date  . Hyperlipidemia   . Hypertension   . CHF (congestive heart failure)   . COPD (chronic obstructive pulmonary disease)   . Cancer     prostate  . Postsurgical percutaneous transluminal coronary angioplasty (PTCA) status   . S/P coronary artery stent placement     Medications:  Scheduled:  . antiseptic oral rinse  7 mL Mouth Rinse q12n4p  . aspirin EC  81 mg Oral Daily  . atorvastatin  20 mg Oral q1800  . bisoprolol  5 mg Oral Daily  . ceFEPime (MAXIPIME) IV  500 mg Intravenous Q12H  . chlorhexidine  15 mL Mouth Rinse BID  . enoxaparin (LOVENOX) injection  30 mg Subcutaneous Q24H  . feeding supplement (ENSURE COMPLETE)  237 mL Oral BID BM  . furosemide  40 mg Oral Daily  . guaiFENesin  600 mg Oral BID  . ipratropium-albuterol  3 mL Nebulization Q6H  . isosorbide-hydrALAZINE  1 tablet Oral TID  . megestrol  400 mg Oral BID  . predniSONE  40 mg Oral Q breakfast  . sodium chloride  3 mL Intravenous Q12H  . vancomycin  750 mg Intravenous Q24H   Assessment: 52 yoM admitted 9/25 with acute worsening of chronic ShOB and hypoxia. Patient has Hx CKD4, COPD and is on home O2, also with recently diagnosed lung mass. Patient was seen in the ED earlier today, opacity was noted on CXR, and patient was discharged on levofloxacin and told to follow-up with his PCP. Patient then returned to the ED ~5 hours later with worsening  symptoms and requesting admission. Noted patient with a recent admission 8/9-8/18 for CAP treated with ceftriaxone and azithromycin while inpatient, and finished with 8 more days of levofloxacin therapy. Pharmacy has been consulted to dose vancomycin and cefepime for HCAP.  Antiinfectives 9/25 >> cefepime >> 9/25 >> vancomycin >>    Labs / vitals Tmax: afebrile WBCs: WNL Renal: SCr 1.49 (appears at baseline), CrCl 30 ml/min CG, 36 ml/min normalized   Microbiology 9/25 blood x2: IP 9/25 Legionella Ur Ag: ordered  9/25 S pneumo Ur Ag: negative 9/25 sputum: ordered   Goal of Therapy:  Vancomycin trough level 15-20 mcg/ml cefepime per indication and renal function  Plan:   - cefepime 500mg  IV q12h  - vancomycin 750mg  IV q24h   - follow-up clinical course, culture results, renal function - follow-up antibiotic de-escalation with potential discharge on Tues  Thank you for the consult.  Dolly Rias RPh 07/06/2014, 11:17 AM Pager [phone]

## 2014-07-06 NOTE — Progress Notes (Addendum)
Clinical Social Work Department CLINICAL SOCIAL WORK PLACEMENT NOTE 07/06/2014  Patient:  Edward Nunez, Edward Nunez  Account Number:  [id number] Admit date:  07/03/2014  Clinical Social Worker:  Sindy Messing, LCSW  Date/time:  07/06/2014 11:00 AM  Clinical Social Work is seeking post-discharge placement for this patient at the following level of care:   [place]   (*CSW will update this form in Epic as items are completed)   07/05/2014  Patient/family provided with [facility] Department of Clinical Social Work's list of facilities offering this level of care within the geographic area requested by the patient (or if unable, by the patient's family).  07/05/2014  Patient/family informed of their freedom to choose among providers that offer the needed level of care, that participate in Medicare, Medicaid or managed care program needed by the patient, have an available bed and are willing to accept the patient.  07/05/2014  Patient/family informed of [facility]' ownership interest in [facility], as well as of the fact that they are under no obligation to receive care at this facility.  PASARR submitted to EDS on existing # PASARR number received on   FL2 transmitted to all facilities in geographic area requested by pt/family on  07/05/2014 FL2 transmitted to all facilities within larger geographic area on   Patient informed that his/her managed care company has contracts with or will negotiate with  certain facilities, including the following:     Patient/family informed of bed offers received:  07/06/2014 Patient chooses bed at [facility] Physician recommends and patient chooses bed at   Patient to be transferred to [facility] on 07/07/2014  Patient to be transferred to facility by [facility] Patient and family notified of transfer on 07/07/2014  Name of family member notified: Wife and daughter [name]) via phone    The following physician request were entered in  Epic:   Additional Comments:

## 2014-07-06 NOTE — Telephone Encounter (Signed)
Dr Linna Darner,   Langley Gauss, daughter would like you to call,  Her dad is in [facility] and will be going to an rehab.   She wants to talk with you  [phone]

## 2014-07-06 NOTE — Progress Notes (Signed)
Clinical Social Work  CSW received a call from family reporting they have chosen [facility]. CSW spoke with SNF who is agreeable to accept at [place]. CSW will continue to follow and assist with transfer when patient is medically stable.  [place], [place] [phone]

## 2014-07-06 NOTE — Progress Notes (Signed)
Chaplain consult due to goals of care meeting.  Introduced spiritual care as resource.  Provided support at bedside around decision making.  Pt expressed not wishing to be a burden to his family.   Family continues to stress to patient that they wish to care for him through his illness.  Family expressed helpfulness of palliative consult and requested prayers - stating that they wished to pray for peace.   [place], [place]

## 2014-07-07 DIAGNOSIS — H00019 Hordeolum externum unspecified eye, unspecified eyelid: Secondary | ICD-10-CM | POA: Diagnosis not present

## 2014-07-07 MED ORDER — CLINDAMYCIN HCL 150 MG PO CAPS: 450.0000 mg | ORAL_CAPSULE | Freq: Three times a day (TID) | ORAL | Status: DC

## 2014-07-07 MED ORDER — LEVOFLOXACIN 750 MG PO TABS: 750.0000 mg | ORAL_TABLET | ORAL | Status: AC

## 2014-07-07 MED ORDER — IPRATROPIUM-ALBUTEROL 0.5-2.5 (3) MG/3ML IN SOLN
3.0000 mL | RESPIRATORY_TRACT | Status: DC
  Administered 2014-07-07 (×2): 3 mL via RESPIRATORY_TRACT
  Filled 2014-07-07 (×2): qty 3

## 2014-07-07 MED ORDER — LORAZEPAM 0.5 MG PO TABS: 0.5000 mg | ORAL_TABLET | Freq: Two times a day (BID) | ORAL | Status: DC | PRN

## 2014-07-07 MED ORDER — ENSURE COMPLETE PO LIQD: 237.0000 mL | Freq: Two times a day (BID) | ORAL | Status: AC

## 2014-07-07 MED ORDER — CIPROFLOXACIN HCL 0.3 % OP SOLN
1.0000 [drp] | OPHTHALMIC | Status: DC
  Administered 2014-07-07: 1 [drp] via OPHTHALMIC
  Filled 2014-07-07: qty 2.5

## 2014-07-07 MED ORDER — MEGESTROL ACETATE 400 MG/10ML PO SUSP: 400.0000 mg | Freq: Two times a day (BID) | ORAL | Status: DC

## 2014-07-07 MED ORDER — CIPROFLOXACIN HCL 0.3 % OP SOLN: 1.0000 [drp] | OPHTHALMIC | Status: DC

## 2014-07-07 NOTE — Consult Note (Signed)
I have reviewed this case with our NP and agree with the Assessment and Plan as stated.  Coretta Leisey L. Kaiana Marion, MD MBA The Palliative Medicine Team at [place] Team Phone: [phone] Pager: [phone]   

## 2014-07-07 NOTE — Discharge Summary (Addendum)
Physician Discharge Summary  Edward Nunez AST:[id number] DOB: 1926/02/27 DOA: 07/03/2014  PCP: [name], MD  Admit date: 07/03/2014 Discharge date: 07/07/2014  Time spent: 35 minutes  Recommendations for Outpatient Follow-up:  1. D/C to SNF for PT and skilled nursing 2.  Needs palliative care follow up at SNF  Discharge Diagnoses:  Principal Problem:   HCAP (healthcare-associated pneumonia)   Active Problems:   COPD exacerbation   Periorbital cellulitis of left eye   HTN (hypertension)   CKD (chronic kidney disease) stage 4, GFR 15-29 ml/min   Protein-calorie malnutrition, severe   Lung mass   Cardiomyopathy   Hypokalemia   Chronic hypoxemic respiratory failure   FTT (failure to thrive) in adult   Palliative care encounter   Stye, left eye   Discharge Condition: fair  Diet recommendation: regular with supplements  Filed Weights   07/05/14 0615 07/06/14 0455 07/07/14 0431  Weight: 56.019 kg (123 lb 8 oz) 55.566 kg (122 lb 8 oz) 55.974 kg (123 lb 6.4 oz)    History of present illness:  Please refer to admission H&P for details, but in brief, 80 year old male with history of COPD recently discharged on 2 L home oxygen, chronic kidney disease stage IV, severe protein malnutrition, hypertension, recently diagnosed right upper lobe lung mass suspected to be bronchogenic carcinoma getting outpatient evaluation, ischemic cardiomyopathy with worsened EF on recent 2-D echo who presented to the ED with progressive shortness of breath for the past few days associated with productive cough with yellowish phlegm and occasionally mixed with small amount of blood. Patient has generalized weakness and weight loss. chext Xray showing RLL pneumonia.   Hospital Course:  Principal Problem:  Healthcare associated pneumonia  - placed on empiric vancomycin and cefepime. cx so far negative. urine for Legionella and strep negative  -02 via [place] prn. Continue albuterol and Atrovent nebs .  patient gets dyspneic on minimal exertion. Possibly due to  COPD, CHF and deconditioning .  Supportive care with tylenol and antitussives.  -remains afebrile . abx transitioned to oral Levaquin will complete a 7 day course on 07/09/2014.  Active Problems:  COPD with chr resp failure  Mild exacerbation. [name] scheduled duoneb every 4hrs and prn albuterol neb. 02 via [place] (on 2 L)  Continue home inhalers . Receive a course of oral prednisone. Has progressive dyspnea . Added low dose ativan prn for symptomatic relief.  Left periorbital cellulitis  noticed since 9/26. CT of orbits w/o contrast on 9/27 shows cellulitis with no abscess. clinically worsened today with increased ptosis. repeated CT of the orbit with contrast to evaluate for any abscess or cavernous sinus thrombosis. Repeat CT shows soft tissue edema of left preseptal periorbital region and left side of nose. doesnot comment on abscess or cavernous sinus involvement. No pain or blurred vision . No headache , N/ V.  abx switched to clindamycin . symptoms better today. patient has also developed a stye on his left eye. Added ciprfloxacin drops.  -will discharge on 10 day course of oral clindamycin.   Lung mass with ? Bronchogenic ca  Recent CT chest with contrast ( on 06/19/2014) shows small right upper lobe mass with right pleural effusion and lymphadenopathy suspicious for primary bronchogenic ca. recommend PET CT. As per pulmonary note recently PET scan planned as outpt. Given his age, severe malnutrition, chronic respiratory failure and severe cardiomyopathy he is unlikely a candidate for any chemotherapy or radiation. PCP recommends palliative care/ hospice.  [place] meeting with palliative care team this admission  (  with patient, his wife and one of the daughters). Agrees on going to short term SNF and palliative care follow up there. Agrees on hospice in the long run.  Patient made DNR as he doesn't wish for any active resuscitation.    generalized weakness with deconditioning  Secondary to underlying conditions. Seen by PT and recommends short term rehab.   Cardiomyopathy  Patient on recent hospitalization found to have acute on chronic systolic CHF with EF of 38%. Coreg was switched to bisoprolol. Patient not on ACE inhibitor due to renal insufficiency and placed on BiDil. Given his age cardiology suggested that he's not a candidate for ICD but did recommend cardiac rehabilitation as outpatient.  -Patient is currently euvolemic with no signs or symptoms of heart failure.  Continue ASA and statin   HTN (hypertension)  Continue home meds   CKD (chronic kidney disease) stage 4, GFR 15-29 ml/min  At baseline.   Protein-calorie malnutrition, severe  Continue ensure. Placed on regular diet.. Reports significant weight loss in past 1 year and poor appetite. added megace for appetite stimulation.   Hypokalemia  Replenished    Code Status: DNR  Family Communication: spoke with wife and daughter on 9/28  Disposition Plan: SNF    Discharge Exam: Filed Vitals:   07/07/14 0900  BP:   Pulse: 70  Temp:   Resp:     General: NAD, cachetic  HEENT:  temporal wasting, moist mucosa, left infraorbital swelling with stye over left eye and some mucous discharge., non tender Chest: diminished breath sounds on right , no rhonchi or wheeze  Abd: sot, NT, ND,  EXT: warm, no edema  CNS: alert and oriented   Discharge Instructions You were cared for by a hospitalist during your hospital stay. If you have any questions about your discharge medications or the care you received while you were in the hospital after you are discharged, you can call the unit and asked to speak with the hospitalist on call if the hospitalist that took care of you is not available. Once you are discharged, your primary care physician will handle any further medical issues. Please note that NO REFILLS for any discharge medications will be authorized  once you are discharged, as it is imperative that you return to your primary care physician (or establish a relationship with a primary care physician if you do not have one) for your aftercare needs so that they can reassess your need for medications and monitor your lab values.   Current Discharge Medication List    START taking these medications   Details  ciprofloxacin (CILOXAN) 0.3 % ophthalmic solution Place 1 drop into the left eye every 4 (four) hours while awake. Administer  1 drop, every 4 hours, while awake, for the next 5 days. Qty: 10 mL, Refills: 0 until 07/11/2014    clindamycin (CLEOCIN) 150 MG capsule Take 3 capsules (450 mg total) by mouth 3 (three) times daily. Qty: 90 capsule, Refills: 0 until 07/16/2014    feeding supplement, ENSURE COMPLETE, (ENSURE COMPLETE) LIQD Take 237 mLs by mouth 2 (two) times daily between meals. Qty: 60 Bottle, Refills: 0    LORazepam (ATIVAN) 0.5 MG tablet Take 1 tablet (0.5 mg total) by mouth 2 (two) times daily as needed for anxiety or sleep. Qty: 20 tablet, Refills: 0    megestrol (MEGACE) 400 MG/10ML suspension Take 10 mLs (400 mg total) by mouth 2 (two) times daily. Qty: 240 mL, Refills: 0      CONTINUE these  medications which have CHANGED   Details  levofloxacin (LEVAQUIN) 750 MG tablet Take 1 tablet (750 mg total) by mouth every other day. Qty: 1 tablet, Refills: 0 until 07/09/2014      CONTINUE these medications which have NOT CHANGED   Details  albuterol-ipratropium (COMBIVENT) 18-103 MCG/ACT inhaler Inhale 1 puff into the lungs every 4 (four) hours.    aspirin 81 MG tablet Take 81 mg by mouth daily.    atorvastatin (LIPITOR) 20 MG tablet Take 20 mg by mouth at bedtime.     bisoprolol (ZEBETA) 5 MG tablet Take 1 tablet (5 mg total) by mouth daily. Qty: 30 tablet, Refills: 2    DOCUSATE SODIUM PO Take 1 capsule by mouth as needed (as directed.).    furosemide (LASIX) 40 MG tablet Take 20 mg by mouth daily.      ipratropium-albuterol (DUONEB) 0.5-2.5 (3) MG/3ML SOLN Take 3 mLs by nebulization every 4 (four) hours as needed (shortness of breath). Qty: 360 mL, Refills: 0    isosorbide-hydrALAZINE (BIDIL) 20-37.5 MG per tablet Take 1 tablet by mouth 3 (three) times daily.       No Known Allergies Follow-up Information   Please follow up. (MD at SNF)        The results of significant diagnostics from this hospitalization (including imaging, microbiology, ancillary and laboratory) are listed below for reference.    Significant Diagnostic Studies: Dg Chest 2 View (if Patient Has Fever And/or Copd)  07/03/2014   CLINICAL DATA:  Shortness of breath which has increased over 24 hr  EXAM: CHEST  2 VIEW  COMPARISON:  06/19/2014  FINDINGS: Mild cardiomegaly is stable from previous. There is mild aortic tortuosity.  There is likely a background of interstitial lung disease. There are increasingly dense opacities in the right lung which are now coarse and nodular. Nodular density in the right upper lobe is again seen, a possible neoplasm as described on recent chest CT. No edema in the left lung. No significant effusion or pneumothorax.  IMPRESSION: 1. Increasing airspace disease on the right may reflect a new or progressive pneumonia. 2. Right upper lobe nodule/mass, reference chest CT 06/19/2014.   Electronically Signed   By: Jorje Guild M.D.   On: 07/03/2014 04:56   Ct Head Wo Contrast  07/05/2014   CLINICAL DATA:  Left infraorbital puffiness and ptosis. No visual symptoms.  EXAM: CT HEAD AND ORBITS WITHOUT CONTRAST  TECHNIQUE: Contiguous axial images were obtained from the base of the skull through the vertex without contrast. Multidetector CT imaging of the orbits was performed using the standard protocol without intravenous contrast.  COMPARISON:  None.  FINDINGS: CT HEAD FINDINGS  There is no evidence of acute intracranial hemorrhage, mass lesion, brain edema or extra-axial fluid collection. The  ventricles and subarachnoid spaces are diffusely prominent consistent with age-appropriate atrophy. There is no CT evidence of acute cortical infarction. Patchy small vessel ischemic changes are present within the periventricular white matter and basal ganglia.  There is mild mucosal thickening in the left frontal sinus. The additional paranasal sinuses, mastoid air cells and middle ears appear clear. The calvarium is intact.  CT ORBITS FINDINGS  There is asymmetric periorbital soft tissue thickening on the left which appears fairly diffuse. No focal fluid collection, foreign body or soft tissue emphysema identified. There are no inflammatory changes within the postseptal fat. The globes are intact and appear symmetrically position. There is no thickening of the extra-ocular muscles. The optic nerves appear normal.  In  addition to the mild mucosal thickening in the left frontal sinus, there is mild mucosal thickening in the right maxillary sinus. There are no air-fluid levels. There is no evidence of acute fracture or bone destruction.  IMPRESSION: 1. Preseptal periorbital soft tissue swelling on the left, likely cellulitis. Correlate clinically. 2. No evidence of postseptal inflammation, orbital emphysema or foreign body. 3. No evidence of active sinusitis. There is mild mucosal thickening in the left frontal and right maxillary sinuses. 4. No acute intracranial findings.  Age-appropriate atrophy.   Electronically Signed   By: Camie Patience M.D.   On: 07/05/2014 17:25   Ct Chest Wo Contrast  06/19/2014   CLINICAL DATA:  RIGHT upper lobe mass. Hemoptysis. 35 lb weight loss.  EXAM: CT CHEST WITHOUT CONTRAST  TECHNIQUE: Multidetector CT imaging of the chest was performed following the standard protocol without IV contrast.  COMPARISON:  Chest radiograph 06/01/2014.  12/04/2012.  FINDINGS: Musculoskeletal: Upper lumbar spondylosis and severe lower cervical spondylosis. No thoracic spine compression fractures or  aggressive osseous lesions. No rib destruction is evident associated with RIGHT upper lobe mass lesion. Severe bilateral AC joint osteoarthritis.  Lungs: The radiographically evident mass in the RIGHT apex measures 43 mm x 28 mm on axial image 15. This abuts the pleural surface but appears pulmonary parenchymal in location. There is diffuse interstitial and alveolar opacity throughout the RIGHT lung. Underlying severe centrilobular emphysema. No airspace disease or interstitial opacity in the LEFT lung. Compressive atelectasis associated with RIGHT pleural effusion.  Central airways: Nodular density is present in the inferior RIGHT mainstem bronchus, probably representing retained or tenacious secretions. (Image 32 series 4).  Vasculature: Atherosclerosis and coronary artery disease. Cardiomegaly.  Effusions: Small to moderate RIGHT pleural effusion is present, with some loculation anteriorly over the RIGHT hemidiaphragm. Associated compressive atelectasis. No LEFT pleural effusion. Small pericardial effusion.  Lymphadenopathy: The patient is cachectic. There is no axillary adenopathy. Mediastinal adenopathy is present. Precarinal node measures 12 mm short axis. Hilar adenopathy is adequately evaluated in the absence of IV contrast. Conglomeration of AP window lymph nodes present. Subcarinal node measures 15 mm short axis with small calcification probably from granulomatous disease. Small anterior pericardial node present.  Esophagus: Grossly normal.  Upper abdomen: Old granulomatous disease. Atherosclerosis. Probable cyst in the LEFT upper renal pole.  Other: None.  IMPRESSION: 1. 43 mm x 28 mm RIGHT upper lobe mass. In the presence of RIGHT pleural effusion and lymphadenopathy this is suspicious for primary bronchogenic carcinoma with malignant effusion. Atypical infection is considered less likely. Cryptogenic organizing pneumonia is a consideration. Followup PET-CT recommended to assess for metabolic activity.  2. Atherosclerosis. 3. Severe emphysema. 4. Cardiomegaly with small pleural effusion.   Electronically Signed   By: Dereck Ligas M.D.   On: 06/19/2014 14:54   Dg Chest Port 1 View  07/06/2014   CLINICAL DATA:  Dyspnea.  EXAM: PORTABLE CHEST - 1 VIEW  COMPARISON:  07/03/2014  FINDINGS: Cardiac enlargement. Pulmonary vascularity is normal. Hyperinflation suggesting emphysematous change. Diffuse fibrosis and bronchiectasis throughout the lungs. Volume loss in the right lung with superimposed airspace disease and probable small effusion. Changes may represent pneumonia. Similar appearance to previous study. Right apical pleural thickening/mass as seen on previous CT.  IMPRESSION: Emphysematous changes and fibrosis throughout both lungs with superimposed infiltrative changes on the right. Right pleural effusion. Changes may represent pneumonia. Right apical pleural mass/ thickening as seen previously on CT.   Electronically Signed   By: Oren Beckmann.D.  On: 07/06/2014 05:18   Ct Orbitss W/o Cm  07/05/2014   CLINICAL DATA:  Left infraorbital puffiness and ptosis. No visual symptoms.  EXAM: CT HEAD AND ORBITS WITHOUT CONTRAST  TECHNIQUE: Contiguous axial images were obtained from the base of the skull through the vertex without contrast. Multidetector CT imaging of the orbits was performed using the standard protocol without intravenous contrast.  COMPARISON:  None.  FINDINGS: CT HEAD FINDINGS  There is no evidence of acute intracranial hemorrhage, mass lesion, brain edema or extra-axial fluid collection. The ventricles and subarachnoid spaces are diffusely prominent consistent with age-appropriate atrophy. There is no CT evidence of acute cortical infarction. Patchy small vessel ischemic changes are present within the periventricular white matter and basal ganglia.  There is mild mucosal thickening in the left frontal sinus. The additional paranasal sinuses, mastoid air cells and middle ears appear clear.  The calvarium is intact.  CT ORBITS FINDINGS  There is asymmetric periorbital soft tissue thickening on the left which appears fairly diffuse. No focal fluid collection, foreign body or soft tissue emphysema identified. There are no inflammatory changes within the postseptal fat. The globes are intact and appear symmetrically position. There is no thickening of the extra-ocular muscles. The optic nerves appear normal.  In addition to the mild mucosal thickening in the left frontal sinus, there is mild mucosal thickening in the right maxillary sinus. There are no air-fluid levels. There is no evidence of acute fracture or bone destruction.  IMPRESSION: 1. Preseptal periorbital soft tissue swelling on the left, likely cellulitis. Correlate clinically. 2. No evidence of postseptal inflammation, orbital emphysema or foreign body. 3. No evidence of active sinusitis. There is mild mucosal thickening in the left frontal and right maxillary sinuses. 4. No acute intracranial findings.  Age-appropriate atrophy.   Electronically Signed   By: Camie Patience M.D.   On: 07/05/2014 17:25   Ct Orbits W/cm  07/06/2014   CLINICAL DATA:  Left orbit swelling.  Cellulitis.  EXAM: CT ORBITS WITH CONTRAST  TECHNIQUE: Multidetector CT imaging of the orbits was performed following the bolus administration of intravenous contrast.  CONTRAST:  9mL OMNIPAQUE IOHEXOL 300 MG/ML  SOLN  COMPARISON:  CT of the head and orbits 07/05/2014  FINDINGS: There is soft tissue edema involving the left periorbital region and left side of the nose. The edema appears to be preseptal. No evidence for postseptal edema/inflammation. No periorbital abscess. The globes are intact. There is mucoperiosteal thickening of scattered left-sided ethmoid air cells and left frontal air cells. No air-fluid levels are identified within the visualized paranasal sinuses. No evidence for osseous erosion.  The base of the brain shows atrophy and vascular calcifications. No  epidural abscess.  IMPRESSION: 1. Soft tissue edema of the left preseptal periorbital region and left side of the nose. 2. No evidence for abscess or postseptal inflammation or edema. 3. Changes of chronic sinusitis.   Electronically Signed   By: Shon Hale M.D.   On: 07/06/2014 14:51    Microbiology: Recent Results (from the past 240 hour(s))  CULTURE, BLOOD (ROUTINE X 2)     Status: None   Collection Time    07/03/14  1:41 PM      Result Value Ref Range Status   Specimen Description BLOOD RIGHT ANTECUBITAL   Final   Special Requests BOTTLES DRAWN AEROBIC AND ANAEROBIC 7 CC EA   Final   Culture  Setup Time     Final   Value: 07/03/2014 16:18  Performed at [facility]     Final   Value:        BLOOD CULTURE RECEIVED NO GROWTH TO DATE CULTURE WILL BE HELD FOR 5 DAYS BEFORE ISSUING A FINAL NEGATIVE REPORT     Performed at [facility]   Report Status PENDING   Incomplete  CULTURE, BLOOD (ROUTINE X 2)     Status: None   Collection Time    07/03/14  1:54 PM      Result Value Ref Range Status   Specimen Description BLOOD RIGHT HAND   Final   Special Requests BOTTLES DRAWN AEROBIC ONLY 3 CC   Final   Culture  Setup Time     Final   Value: 07/03/2014 16:17     Performed at [facility]   Culture     Final   Value:        BLOOD CULTURE RECEIVED NO GROWTH TO DATE CULTURE WILL BE HELD FOR 5 DAYS BEFORE ISSUING A FINAL NEGATIVE REPORT     Performed at [facility]   Report Status PENDING   Incomplete     Labs: Basic Metabolic Panel:  Recent Labs Lab 07/03/14 0529 07/05/14 0542  NA 143  --   K 3.5*  --   CL 103  --   CO2 25  --   GLUCOSE 99  --   BUN 24*  --   CREATININE 1.49* 1.41*  CALCIUM 9.5  --    Liver Function Tests: No results found for this basename: AST, ALT, ALKPHOS, BILITOT, PROT, ALBUMIN,  in the last 168 hours No results found for this basename: LIPASE, AMYLASE,  in the last 168 hours No results found for this  basename: AMMONIA,  in the last 168 hours CBC:  Recent Labs Lab 07/03/14 0529  WBC 6.1  HGB 11.5*  HCT 34.4*  MCV 80.6  PLT 296   Cardiac Enzymes:  Recent Labs Lab 07/03/14 0539  TROPONINI <0.30   BNP: BNP (last 3 results)  Recent Labs  05/17/14 0902 05/22/14 0407 07/03/14 0530  PROBNP 10162.0* 15127.0* 2758.0*   CBG: No results found for this basename: GLUCAP,  in the last 168 hours     Signed:  Airanna Partin  [facility] 07/07/2014, 10:46 AM

## 2014-07-07 NOTE — Progress Notes (Signed)
Clinical Social Work    DC faxed to [facility] today, agreeable to accept patient today. DC packet prepared with DNR form, DC summary, FL2, and hard scripts. Patient and family aware and agreeable to plans and prefer [facility] to transport. RN to report to SNF. [facility] #: [id number]  CSW is signing off, if needed contact  Sindy Messing, [place] [phone]

## 2014-07-07 NOTE — Progress Notes (Signed)
Report called to [facility]. Report given to Marlis Edelson, LPN. Staff has no questions at this time. Writers number left if needed for questions.

## 2014-07-08 NOTE — Progress Notes (Signed)
Called [facility] to get  approval for megestrol 400 mg BID, but it was declined , as megestrol 400 mg oral 2 times a day for cachexia is not in their formulary , tried to explain, but request was denied .

## 2014-07-09 LAB — CULTURE, BLOOD (ROUTINE X 2): CULTURE: NO GROWTH

## 2014-07-09 LAB — EYE CULTURE: Special Requests: NORMAL

## 2014-07-11 LAB — CULTURE, BLOOD (ROUTINE X 2): Culture: NO GROWTH

## 2014-07-13 ENCOUNTER — Emergency Department (HOSPITAL_COMMUNITY): Payer: Medicare Other

## 2014-07-13 ENCOUNTER — Inpatient Hospital Stay (HOSPITAL_COMMUNITY)
Admission: EM | Admit: 2014-07-13 | Discharge: 2014-07-17 | DRG: 193 | Disposition: A | Discharge status: Hospice | Payer: Medicare Other | Attending: Internal Medicine | Admitting: Internal Medicine

## 2014-07-13 ENCOUNTER — Encounter (HOSPITAL_COMMUNITY): Payer: Self-pay | Admitting: Emergency Medicine

## 2014-07-13 DIAGNOSIS — L03213 Periorbital cellulitis: Secondary | ICD-10-CM

## 2014-07-13 DIAGNOSIS — R627 Adult failure to thrive: Secondary | ICD-10-CM | POA: Diagnosis present

## 2014-07-13 DIAGNOSIS — I429 Cardiomyopathy, unspecified: Secondary | ICD-10-CM | POA: Diagnosis present

## 2014-07-13 DIAGNOSIS — F419 Anxiety disorder, unspecified: Secondary | ICD-10-CM | POA: Diagnosis not present

## 2014-07-13 DIAGNOSIS — R52 Pain, unspecified: Secondary | ICD-10-CM | POA: Diagnosis not present

## 2014-07-13 DIAGNOSIS — Z681 Body mass index (BMI) 19 or less, adult: Secondary | ICD-10-CM

## 2014-07-13 DIAGNOSIS — E43 Unspecified severe protein-calorie malnutrition: Secondary | ICD-10-CM | POA: Diagnosis present

## 2014-07-13 DIAGNOSIS — E785 Hyperlipidemia, unspecified: Secondary | ICD-10-CM | POA: Diagnosis present

## 2014-07-13 DIAGNOSIS — I350 Nonrheumatic aortic (valve) stenosis: Secondary | ICD-10-CM | POA: Diagnosis present

## 2014-07-13 DIAGNOSIS — Z87891 Personal history of nicotine dependence: Secondary | ICD-10-CM | POA: Diagnosis not present

## 2014-07-13 DIAGNOSIS — J189 Pneumonia, unspecified organism: Principal | ICD-10-CM | POA: Diagnosis present

## 2014-07-13 DIAGNOSIS — Z955 Presence of coronary angioplasty implant and graft: Secondary | ICD-10-CM | POA: Diagnosis not present

## 2014-07-13 DIAGNOSIS — Y95 Nosocomial condition: Secondary | ICD-10-CM | POA: Diagnosis present

## 2014-07-13 DIAGNOSIS — D638 Anemia in other chronic diseases classified elsewhere: Secondary | ICD-10-CM

## 2014-07-13 DIAGNOSIS — L03211 Cellulitis of face: Secondary | ICD-10-CM | POA: Diagnosis present

## 2014-07-13 DIAGNOSIS — J9611 Chronic respiratory failure with hypoxia: Secondary | ICD-10-CM | POA: Diagnosis present

## 2014-07-13 DIAGNOSIS — D631 Anemia in chronic kidney disease: Secondary | ICD-10-CM | POA: Diagnosis present

## 2014-07-13 DIAGNOSIS — Z515 Encounter for palliative care: Secondary | ICD-10-CM | POA: Diagnosis not present

## 2014-07-13 DIAGNOSIS — J441 Chronic obstructive pulmonary disease with (acute) exacerbation: Secondary | ICD-10-CM | POA: Diagnosis present

## 2014-07-13 DIAGNOSIS — R636 Underweight: Secondary | ICD-10-CM | POA: Diagnosis present

## 2014-07-13 DIAGNOSIS — J9601 Acute respiratory failure with hypoxia: Secondary | ICD-10-CM

## 2014-07-13 DIAGNOSIS — C61 Malignant neoplasm of prostate: Secondary | ICD-10-CM | POA: Diagnosis present

## 2014-07-13 DIAGNOSIS — I2510 Atherosclerotic heart disease of native coronary artery without angina pectoris: Secondary | ICD-10-CM | POA: Diagnosis present

## 2014-07-13 DIAGNOSIS — R0600 Dyspnea, unspecified: Secondary | ICD-10-CM

## 2014-07-13 DIAGNOSIS — Z66 Do not resuscitate: Secondary | ICD-10-CM | POA: Diagnosis present

## 2014-07-13 DIAGNOSIS — I129 Hypertensive chronic kidney disease with stage 1 through stage 4 chronic kidney disease, or unspecified chronic kidney disease: Secondary | ICD-10-CM | POA: Diagnosis present

## 2014-07-13 DIAGNOSIS — R451 Restlessness and agitation: Secondary | ICD-10-CM | POA: Diagnosis not present

## 2014-07-13 DIAGNOSIS — R918 Other nonspecific abnormal finding of lung field: Secondary | ICD-10-CM | POA: Diagnosis present

## 2014-07-13 DIAGNOSIS — R531 Weakness: Secondary | ICD-10-CM | POA: Diagnosis present

## 2014-07-13 DIAGNOSIS — J9621 Acute and chronic respiratory failure with hypoxia: Secondary | ICD-10-CM | POA: Diagnosis present

## 2014-07-13 DIAGNOSIS — I959 Hypotension, unspecified: Secondary | ICD-10-CM | POA: Diagnosis not present

## 2014-07-13 DIAGNOSIS — N184 Chronic kidney disease, stage 4 (severe): Secondary | ICD-10-CM | POA: Diagnosis present

## 2014-07-13 DIAGNOSIS — Z9049 Acquired absence of other specified parts of digestive tract: Secondary | ICD-10-CM | POA: Diagnosis present

## 2014-07-13 DIAGNOSIS — Z79899 Other long term (current) drug therapy: Secondary | ICD-10-CM

## 2014-07-13 DIAGNOSIS — I5043 Acute on chronic combined systolic (congestive) and diastolic (congestive) heart failure: Secondary | ICD-10-CM | POA: Diagnosis present

## 2014-07-13 DIAGNOSIS — Z7982 Long term (current) use of aspirin: Secondary | ICD-10-CM

## 2014-07-13 LAB — BLOOD GAS, ARTERIAL
Acid-Base Excess: 3.1 mmol/L — ABNORMAL HIGH (ref 0.0–2.0)
BICARBONATE: 25.8 meq/L — AB (ref 20.0–24.0)
Drawn by: 232811
O2 Content: 4 L/min
O2 Saturation: 95.9 %
PCO2 ART: 33.8 mmHg — AB (ref 35.0–45.0)
PH ART: 7.495 — AB (ref 7.350–7.450)
PO2 ART: 75 mmHg — AB (ref 80.0–100.0)
Patient temperature: 37.3
TCO2: 23.3 mmol/L (ref 0–100)

## 2014-07-13 LAB — CBC WITH DIFFERENTIAL/PLATELET
Basophils Absolute: 0 10*3/uL (ref 0.0–0.1)
Basophils Relative: 0 % (ref 0–1)
EOS ABS: 0.1 10*3/uL (ref 0.0–0.7)
EOS PCT: 1 % (ref 0–5)
HCT: 28.8 % — ABNORMAL LOW (ref 39.0–52.0)
Hemoglobin: 9.9 g/dL — ABNORMAL LOW (ref 13.0–17.0)
Lymphocytes Relative: 12 % (ref 12–46)
Lymphs Abs: 0.9 10*3/uL (ref 0.7–4.0)
MCH: 27 pg (ref 26.0–34.0)
MCHC: 34.4 g/dL (ref 30.0–36.0)
MCV: 78.5 fL (ref 78.0–100.0)
Monocytes Absolute: 0.6 10*3/uL (ref 0.1–1.0)
Monocytes Relative: 9 % (ref 3–12)
Neutro Abs: 5.6 10*3/uL (ref 1.7–7.7)
Neutrophils Relative %: 78 % — ABNORMAL HIGH (ref 43–77)
PLATELETS: 265 10*3/uL (ref 150–400)
RBC: 3.67 MIL/uL — AB (ref 4.22–5.81)
RDW: 15.3 % (ref 11.5–15.5)
WBC: 7.2 10*3/uL (ref 4.0–10.5)

## 2014-07-13 LAB — I-STAT CHEM 8, ED
BUN: 40 mg/dL — ABNORMAL HIGH (ref 6–23)
Calcium, Ion: 1.2 mmol/L (ref 1.13–1.30)
Chloride: 104 mEq/L (ref 96–112)
Creatinine, Ser: 1.7 mg/dL — ABNORMAL HIGH (ref 0.50–1.35)
GLUCOSE: 112 mg/dL — AB (ref 70–99)
HCT: 30 % — ABNORMAL LOW (ref 39.0–52.0)
HEMOGLOBIN: 10.2 g/dL — AB (ref 13.0–17.0)
Potassium: 4 mEq/L (ref 3.7–5.3)
SODIUM: 142 meq/L (ref 137–147)
TCO2: 26 mmol/L (ref 0–100)

## 2014-07-13 LAB — PRO B NATRIURETIC PEPTIDE: Pro B Natriuretic peptide (BNP): 2977 pg/mL — ABNORMAL HIGH (ref 0–450)

## 2014-07-13 LAB — I-STAT TROPONIN, ED: Troponin i, poc: 0 ng/mL (ref 0.00–0.08)

## 2014-07-13 LAB — MRSA PCR SCREENING: MRSA by PCR: NEGATIVE

## 2014-07-13 MED ORDER — IPRATROPIUM-ALBUTEROL 18-103 MCG/ACT IN AERO: 1.0000 | INHALATION_SPRAY | RESPIRATORY_TRACT | Status: DC

## 2014-07-13 MED ORDER — HEPARIN SODIUM (PORCINE) 5000 UNIT/ML IJ SOLN
5000.0000 [IU] | Freq: Three times a day (TID) | INTRAMUSCULAR | Status: DC
  Administered 2014-07-13 – 2014-07-17 (×13): 5000 [IU] via SUBCUTANEOUS
  Filled 2014-07-13 (×16): qty 1

## 2014-07-13 MED ORDER — SODIUM CHLORIDE 0.9 % IV BOLUS (SEPSIS)
500.0000 mL | Freq: Once | INTRAVENOUS | Status: AC
  Administered 2014-07-13: 500 mL via INTRAVENOUS

## 2014-07-13 MED ORDER — ENSURE COMPLETE PO LIQD
237.0000 mL | Freq: Two times a day (BID) | ORAL | Status: DC
  Administered 2014-07-13 – 2014-07-17 (×8): 237 mL via ORAL

## 2014-07-13 MED ORDER — FUROSEMIDE 10 MG/ML IJ SOLN
20.0000 mg | Freq: Once | INTRAMUSCULAR | Status: AC
  Administered 2014-07-13: 20 mg via INTRAVENOUS
  Filled 2014-07-13: qty 2

## 2014-07-13 MED ORDER — CEFEPIME HCL 1 G IJ SOLR
1.0000 g | Freq: Three times a day (TID) | INTRAMUSCULAR | Status: DC
Start: 2014-07-13 — End: 2014-07-13

## 2014-07-13 MED ORDER — CETYLPYRIDINIUM CHLORIDE 0.05 % MT LIQD
7.0000 mL | Freq: Two times a day (BID) | OROMUCOSAL | Status: DC
  Administered 2014-07-13 – 2014-07-16 (×7): 7 mL via OROMUCOSAL

## 2014-07-13 MED ORDER — DEXTROSE 5 % IV SOLN
2.0000 g | Freq: Once | INTRAVENOUS | Status: AC
  Administered 2014-07-13: 2 g via INTRAVENOUS
  Filled 2014-07-13: qty 2

## 2014-07-13 MED ORDER — IPRATROPIUM-ALBUTEROL 0.5-2.5 (3) MG/3ML IN SOLN
3.0000 mL | Freq: Once | RESPIRATORY_TRACT | Status: AC
  Administered 2014-07-13: 3 mL via RESPIRATORY_TRACT
  Filled 2014-07-13: qty 3

## 2014-07-13 MED ORDER — VANCOMYCIN HCL 500 MG IV SOLR
500.0000 mg | INTRAVENOUS | Status: DC
  Administered 2014-07-14 – 2014-07-15 (×2): 500 mg via INTRAVENOUS
  Filled 2014-07-13 (×2): qty 500

## 2014-07-13 MED ORDER — IPRATROPIUM-ALBUTEROL 0.5-2.5 (3) MG/3ML IN SOLN
3.0000 mL | Freq: Four times a day (QID) | RESPIRATORY_TRACT | Status: DC
  Administered 2014-07-13 – 2014-07-17 (×16): 3 mL via RESPIRATORY_TRACT
  Filled 2014-07-13 (×16): qty 3

## 2014-07-13 MED ORDER — IPRATROPIUM-ALBUTEROL 0.5-2.5 (3) MG/3ML IN SOLN
3.0000 mL | RESPIRATORY_TRACT | Status: DC | PRN
  Administered 2014-07-13 (×2): 3 mL via RESPIRATORY_TRACT
  Filled 2014-07-13 (×2): qty 3

## 2014-07-13 MED ORDER — DEXTROSE 5 % IV SOLN
1.0000 g | INTRAVENOUS | Status: DC
  Administered 2014-07-14 – 2014-07-15 (×2): 1 g via INTRAVENOUS
  Filled 2014-07-13 (×2): qty 1

## 2014-07-13 MED ORDER — ISOSORB DINITRATE-HYDRALAZINE 20-37.5 MG PO TABS
1.0000 | ORAL_TABLET | Freq: Three times a day (TID) | ORAL | Status: DC
  Administered 2014-07-13: 1 via ORAL
  Filled 2014-07-13 (×3): qty 1

## 2014-07-13 MED ORDER — FUROSEMIDE 20 MG PO TABS
20.0000 mg | ORAL_TABLET | Freq: Every day | ORAL | Status: DC
  Administered 2014-07-13: 20 mg via ORAL
  Filled 2014-07-13 (×2): qty 1

## 2014-07-13 MED ORDER — LORAZEPAM 0.5 MG PO TABS
0.5000 mg | ORAL_TABLET | Freq: Two times a day (BID) | ORAL | Status: DC | PRN
  Administered 2014-07-13 – 2014-07-14 (×2): 0.5 mg via ORAL
  Filled 2014-07-13 (×2): qty 1

## 2014-07-13 MED ORDER — PIPERACILLIN-TAZOBACTAM 3.375 G IVPB 30 MIN
3.3750 g | Freq: Once | INTRAVENOUS | Status: AC
  Administered 2014-07-13: 3.375 g via INTRAVENOUS
  Filled 2014-07-13: qty 50

## 2014-07-13 MED ORDER — VANCOMYCIN HCL IN DEXTROSE 1-5 GM/200ML-% IV SOLN
1000.0000 mg | Freq: Once | INTRAVENOUS | Status: AC
Start: 2014-07-13 — End: 2014-07-13
  Administered 2014-07-13: 1000 mg via INTRAVENOUS
  Filled 2014-07-13: qty 200

## 2014-07-13 MED ORDER — ATORVASTATIN CALCIUM 20 MG PO TABS
20.0000 mg | ORAL_TABLET | Freq: Every day | ORAL | Status: DC
  Administered 2014-07-13 – 2014-07-16 (×4): 20 mg via ORAL
  Filled 2014-07-13 (×6): qty 1

## 2014-07-13 MED ORDER — ASPIRIN EC 81 MG PO TBEC
81.0000 mg | DELAYED_RELEASE_TABLET | Freq: Every day | ORAL | Status: DC
  Administered 2014-07-13 – 2014-07-17 (×5): 81 mg via ORAL
  Filled 2014-07-13 (×5): qty 1

## 2014-07-13 MED ORDER — BISOPROLOL FUMARATE 5 MG PO TABS
5.0000 mg | ORAL_TABLET | Freq: Every day | ORAL | Status: DC
Start: 2014-07-13 — End: 2014-07-14
  Administered 2014-07-13: 5 mg via ORAL
  Filled 2014-07-13 (×2): qty 1

## 2014-07-13 MED ORDER — ALBUTEROL SULFATE (2.5 MG/3ML) 0.083% IN NEBU
5.0000 mg | INHALATION_SOLUTION | Freq: Once | RESPIRATORY_TRACT | Status: AC
Start: 2014-07-13 — End: 2014-07-13
  Administered 2014-07-13: 5 mg via RESPIRATORY_TRACT
  Filled 2014-07-13: qty 6

## 2014-07-13 MED ORDER — MEGESTROL ACETATE 400 MG/10ML PO SUSP
400.0000 mg | Freq: Two times a day (BID) | ORAL | Status: DC
  Administered 2014-07-13 – 2014-07-17 (×9): 400 mg via ORAL
  Filled 2014-07-13 (×10): qty 10

## 2014-07-13 NOTE — H&P (Signed)
[facility] History and Physical  Edward Nunez NAT:[id number] DOB: [birth year] DOA: 07/13/2014  Referring physician: EDP PCP: Edward Reason, MD   Chief Complaint: SOB   HPI: Edward Nunez is a 80 y.o. male who was just discharged from our service on 9/29 after treatment for HCAP complicated by the presence of known lung mass with pleural effusion seen on CT on 9/11.  He returns to the ED today with worsening SOB since discharge.  He reports mild wheezing, occasional cough, but no fever.  Work up in the ED reveals hypoxia but this is improved on [place].  CXR shows persistence and possibly even worsening of pulmonary infiltrates since discharge.  Review of Systems: Systems reviewed.  As above, otherwise negative  Past Medical History  Diagnosis Date  . Hyperlipidemia   . Hypertension   . CHF (congestive heart failure)   . COPD (chronic obstructive pulmonary disease)   . Cancer     prostate  . Postsurgical percutaneous transluminal coronary angioplasty (PTCA) status   . S/P coronary artery stent placement    Past Surgical History  Procedure Laterality Date  . Radioactive seed implant    . Colon resection     Social History:  reports that he quit smoking about 11 years ago. His smoking use included Cigarettes. He has a 55 pack-year smoking history. He has never used smokeless tobacco. He reports that he does not drink alcohol or use illicit drugs.  No Known Allergies  History reviewed. No pertinent family history.   Prior to Admission medications   Medication Sig Start Date End Date Taking? Authorizing Provider  albuterol-ipratropium (COMBIVENT) 18-103 MCG/ACT inhaler Inhale 1 puff into the lungs every 4 (four) hours.   Yes Historical Provider, MD  aspirin 81 MG tablet Take 81 mg by mouth daily.   Yes Historical Provider, MD  atorvastatin (LIPITOR) 20 MG tablet Take 20 mg by mouth at bedtime.    Yes Historical Provider, MD  bisoprolol (ZEBETA) 5 MG tablet Take 1 tablet (5 mg  total) by mouth daily. 05/26/14  Yes Oswald Hillock, MD  clindamycin (CLEOCIN) 150 MG capsule Take 3 capsules (450 mg total) by mouth 3 (three) times daily. 07/07/14  Yes Nishant Dhungel, MD  furosemide (LASIX) 20 MG tablet Take 20 mg by mouth daily.   Yes Historical Provider, MD  isosorbide-hydrALAZINE (BIDIL) 20-37.5 MG per tablet Take 1 tablet by mouth 3 (three) times daily.   Yes Historical Provider, MD  megestrol (MEGACE) 400 MG/10ML suspension Take 400 mg by mouth 2 (two) times daily.   Yes Historical Provider, MD  DOCUSATE SODIUM PO Take 1 capsule by mouth daily as needed. For constipation    Historical Provider, MD  feeding supplement, ENSURE COMPLETE, (ENSURE COMPLETE) LIQD Take 237 mLs by mouth 2 (two) times daily between meals. 07/07/14   Nishant Dhungel, MD  ipratropium-albuterol (DUONEB) 0.5-2.5 (3) MG/3ML SOLN Take 3 mLs by nebulization every 4 (four) hours as needed (shortness of breath). 05/26/14   Oswald Hillock, MD  LORazepam (ATIVAN) 0.5 MG tablet Take 1 tablet (0.5 mg total) by mouth 2 (two) times daily as needed for anxiety or sleep. 07/07/14   Nishant Dhungel, MD   Physical Exam: Filed Vitals:   07/13/14 0218  BP: 120/70  Pulse: 76  Temp: 98.4 F (36.9 C)  Resp: 20    BP 120/70  Pulse 76  Temp(Src) 98.4 F (36.9 C) (Oral)  Resp 20  SpO2 97%  General Appearance:    Alert, oriented, no distress,  appears stated age  Head:    Normocephalic, atraumatic  Eyes:    PERRL, EOMI, sclera non-icteric        Nose:   Nares without drainage or epistaxis. Mucosa, turbinates normal  Throat:   Moist mucous membranes. Oropharynx without erythema or exudate.  Neck:   Supple. No carotid bruits.  No thyromegaly.  No lymphadenopathy.   Back:     No CVA tenderness, no spinal tenderness  Lungs:     Decreased air movement on the R side  Chest wall:    No tenderness to palpitation  Heart:    Regular rate and rhythm without murmurs, gallops, rubs  Abdomen:     Soft, non-tender, nondistended,  normal bowel sounds, no organomegaly  Genitalia:    deferred  Rectal:    deferred  Extremities:   No clubbing, cyanosis or edema.  Pulses:   2+ and symmetric all extremities  Skin:   Skin color, texture, turgor normal, no rashes or lesions  Lymph nodes:   Cervical, supraclavicular, and axillary nodes normal  Neurologic:   CNII-XII intact. Normal strength, sensation and reflexes      throughout    Labs on Admission:  Basic Metabolic Panel:  Recent Labs Lab 07/13/14 0300  NA 142  K 4.0  CL 104  GLUCOSE 112*  BUN 40*  CREATININE 1.70*   Liver Function Tests: No results found for this basename: AST, ALT, ALKPHOS, BILITOT, PROT, ALBUMIN,  in the last 168 hours No results found for this basename: LIPASE, AMYLASE,  in the last 168 hours No results found for this basename: AMMONIA,  in the last 168 hours CBC:  Recent Labs Lab 07/13/14 0246 07/13/14 0300  WBC 7.2  --   NEUTROABS 5.6  --   HGB 9.9* 10.2*  HCT 28.8* 30.0*  MCV 78.5  --   PLT 265  --    Cardiac Enzymes: No results found for this basename: CKTOTAL, CKMB, CKMBINDEX, TROPONINI,  in the last 168 hours  BNP (last 3 results)  Recent Labs  05/22/14 0407 07/03/14 0530 07/13/14 0246  PROBNP 15127.0* 2758.0* 2977.0*   CBG: No results found for this basename: GLUCAP,  in the last 168 hours  Radiological Exams on Admission: Dg Chest 2 View  07/13/2014   CLINICAL DATA:  Shortness of breath for 1 month with cough for 24 hr. History of hypertension, COPD.  EXAM: CHEST  2 VIEW  COMPARISON:  None.  FINDINGS: Cardiomegaly is similar from prior study. Known mediastinal adenopathy not well seen.  Widespread centrilobular emphysema present. Irregular mass like opacity within the peripheral right upper lobe is grossly stable from prior. Additional patchy infiltrates within the right upper and lower lobes also similar. There is a right pleural effusion with associated compressive atelectasis. Left lung is grossly clear. No  pneumothorax.  No acute osseus abnormality  IMPRESSION: 1. Grossly similar right upper lobe opacities, worrisome for possible underlying malignancy, better evaluated on prior CT from 06/19/2014. Superimposed infiltrates within the right upper and lower lobes also stable, with may be related to underlying malignancy or superimposed pneumonia. 2. Persistent right pleural effusion with associated right basilar atelectasis. 3. Emphysema.   Electronically Signed   By: Jeannine Boga M.D.   On: 07/13/2014 03:17    EKG: Independently reviewed.  Assessment/Plan Principal Problem:   Acute respiratory failure with hypoxia Active Problems:   Lung mass   HCAP (healthcare-associated pneumonia)   Chronic hypoxemic respiratory failure   1. Acute on chronic hypoxemic  respiratory failure - DDX includes worsening HCAP / post-obstructive PNA vs worsening malignancy.  I fear that the latter is more likely given the absence of any SIRS criteria and failure to respond to PNA treatment last week. 1. Empiric treatment for HCAP with cefepime / vanc for now, although day team may wish to discontinue this if patient continues to not improve nor show obvious signs of HCAP. 2. Palliative care consult placed in EPIC. 3. Oxygen 4. Adult wheeze protocol for neb treatments    Code Status: Full Code  Family Communication: No family in room Disposition Plan: Admit to inpatient   Time spent: 55 min  Sheyna Pettibone M. [facility] Pager [phone]  If 7AM-7PM, please contact the day team taking care of the patient [email or web address] Password [email or web address] 07/13/2014, 4:18 AM

## 2014-07-13 NOTE — Telephone Encounter (Signed)
Dr. Linna Darner please call regarding [facility]  [phone] recommends [place].  Wants more information.

## 2014-07-13 NOTE — Progress Notes (Signed)
PT Cancellation Note  Patient Details Name: Edward Nunez MRN: [id number] DOB: 09-Jan-1926   Cancelled Treatment:    Reason Eval/Treat Not Completed: Other (comment) (pt's lunch tray just arrived) Will check back tomorrow for evaluation.   Austen Oyster,KATHrine E 07/13/2014, 3:58 PM Carmelia Bake, PT, DPT 07/13/2014 Pager: [phone]

## 2014-07-13 NOTE — ED Provider Notes (Signed)
CSN: [id number]     Arrival date & time 07/13/14  0156 History   First MD Initiated Contact with Patient 07/13/14 0232     Chief Complaint  Patient presents with  . Shortness of Breath     (Consider location/radiation/quality/duration/timing/severity/associated sxs/prior Treatment) Patient is a 80 y.o. male presenting with shortness of breath. The history is provided by the patient. No language interpreter was used.  Shortness of Breath Severity:  Moderate Onset quality:  Gradual Timing:  Constant Progression:  Worsening Chronicity:  Recurrent Context: not fumes   Relieved by:  Nothing Worsened by:  Nothing tried Ineffective treatments:  None tried Associated symptoms: cough and wheezing   Associated symptoms: no abdominal pain and no fever   Risk factors: no recent alcohol use     Past Medical History  Diagnosis Date  . Hyperlipidemia   . Hypertension   . CHF (congestive heart failure)   . COPD (chronic obstructive pulmonary disease)   . Cancer     prostate  . Postsurgical percutaneous transluminal coronary angioplasty (PTCA) status   . S/P coronary artery stent placement    Past Surgical History  Procedure Laterality Date  . Radioactive seed implant    . Colon resection     No family history on file. History  Substance Use Topics  . Smoking status: Former Smoker -- 1.00 packs/day for 55 years    Types: Cigarettes    Quit date: 02/10/2003  . Smokeless tobacco: Never Used  . Alcohol Use: No    Review of Systems  Constitutional: Negative for fever.  Respiratory: Positive for cough, shortness of breath and wheezing.   Gastrointestinal: Negative for abdominal pain.  All other systems reviewed and are negative.     Allergies  Review of patient's allergies indicates no known allergies.  Home Medications   Prior to Admission medications   Medication Sig Start Date End Date Taking? Authorizing Provider  albuterol-ipratropium (COMBIVENT) 18-103 MCG/ACT  inhaler Inhale 1 puff into the lungs every 4 (four) hours.   Yes Historical Provider, MD  aspirin 81 MG tablet Take 81 mg by mouth daily.   Yes Historical Provider, MD  atorvastatin (LIPITOR) 20 MG tablet Take 20 mg by mouth at bedtime.    Yes Historical Provider, MD  bisoprolol (ZEBETA) 5 MG tablet Take 1 tablet (5 mg total) by mouth daily. 05/26/14  Yes Oswald Hillock, MD  ciprofloxacin (CILOXAN) 0.3 % ophthalmic solution Place 1 drop into the left eye every 4 (four) hours while awake. Administer  1 drop, every 4 hours, while awake, for the next 5 days. 07/07/14  Yes Nishant Dhungel, MD  clindamycin (CLEOCIN) 150 MG capsule Take 3 capsules (450 mg total) by mouth 3 (three) times daily. 07/07/14  Yes Nishant Dhungel, MD  furosemide (LASIX) 20 MG tablet Take 20 mg by mouth daily.   Yes Historical Provider, MD  isosorbide-hydrALAZINE (BIDIL) 20-37.5 MG per tablet Take 1 tablet by mouth 3 (three) times daily.   Yes Historical Provider, MD  megestrol (MEGACE) 400 MG/10ML suspension Take 400 mg by mouth 2 (two) times daily.   Yes Historical Provider, MD  DOCUSATE SODIUM PO Take 1 capsule by mouth daily as needed. For constipation    Historical Provider, MD  feeding supplement, ENSURE COMPLETE, (ENSURE COMPLETE) LIQD Take 237 mLs by mouth 2 (two) times daily between meals. 07/07/14   Nishant Dhungel, MD  ipratropium-albuterol (DUONEB) 0.5-2.5 (3) MG/3ML SOLN Take 3 mLs by nebulization every 4 (four) hours as needed (shortness  of breath). 05/26/14   Oswald Hillock, MD  LORazepam (ATIVAN) 0.5 MG tablet Take 1 tablet (0.5 mg total) by mouth 2 (two) times daily as needed for anxiety or sleep. 07/07/14   Nishant Dhungel, MD   BP 120/70  Pulse 76  Temp(Src) 98.4 F (36.9 C) (Oral)  Resp 20  SpO2 97% Physical Exam  Constitutional: He appears well-developed and well-nourished.  HENT:  Head: Normocephalic and atraumatic.  Mouth/Throat: Oropharynx is clear and moist.  Eyes: Conjunctivae are normal. Pupils are equal,  round, and reactive to light.  Neck: Normal range of motion. Neck supple.  Cardiovascular: Normal rate, regular rhythm and intact distal pulses.   Pulmonary/Chest: Effort normal. No stridor. No respiratory distress. He has decreased breath sounds. He has no wheezes. He has no rales.  Abdominal: Soft. Bowel sounds are normal. There is no tenderness. There is no rebound and no guarding.  Musculoskeletal: Normal range of motion.  Neurological: He is alert. He has normal reflexes.  Skin: Skin is warm and dry.  Psychiatric: He has a normal mood and affect.    ED Course  Procedures (including critical care time) Labs Review Labs Reviewed  CBC WITH DIFFERENTIAL - Abnormal; Notable for the following:    RBC 3.67 (*)    Hemoglobin 9.9 (*)    HCT 28.8 (*)    Neutrophils Relative % 78 (*)    All other components within normal limits  I-STAT CHEM 8, ED - Abnormal; Notable for the following:    BUN 40 (*)    Creatinine, Ser 1.70 (*)    Glucose, Bld 112 (*)    Hemoglobin 10.2 (*)    HCT 30.0 (*)    All other components within normal limits  CULTURE, BLOOD (ROUTINE X 2)  CULTURE, BLOOD (ROUTINE X 2)  PRO B NATRIURETIC PEPTIDE  BLOOD GAS, ARTERIAL  I-STAT TROPOININ, ED    Imaging Review Dg Chest 2 View  07/13/2014   CLINICAL DATA:  Shortness of breath for 1 month with cough for 24 hr. History of hypertension, COPD.  EXAM: CHEST  2 VIEW  COMPARISON:  None.  FINDINGS: Cardiomegaly is similar from prior study. Known mediastinal adenopathy not well seen.  Widespread centrilobular emphysema present. Irregular mass like opacity within the peripheral right upper lobe is grossly stable from prior. Additional patchy infiltrates within the right upper and lower lobes also similar. There is a right pleural effusion with associated compressive atelectasis. Left lung is grossly clear. No pneumothorax.  No acute osseus abnormality  IMPRESSION: 1. Grossly similar right upper lobe opacities, worrisome for  possible underlying malignancy, better evaluated on prior CT from 06/19/2014. Superimposed infiltrates within the right upper and lower lobes also stable, with may be related to underlying malignancy or superimposed pneumonia. 2. Persistent right pleural effusion with associated right basilar atelectasis. 3. Emphysema.   Electronically Signed   By: Jeannine Boga M.D.   On: 07/13/2014 03:17     EKG Interpretation   Date/Time:  Monday July 13 2014 02:00:43 EDT Ventricular Rate:  82 PR Interval:  149 QRS Duration: 152 QT Interval:  399 QTC Calculation: 466 R Axis:   32 Text Interpretation:  Sinus rhythm Atrial premature complexes Probable  left atrial enlargement Left bundle branch block Confirmed by  [facility]  MD, Estera Ozier ([id number]) on 07/13/2014 3:14:15 AM      MDM   Final diagnoses:  None    Admit for COPD  Seen by Dr. Alcario Drought    Romar Woodrick K Marria Mathison-Rasch,  MD 07/13/14 [id number]

## 2014-07-13 NOTE — ED Notes (Signed)
Bed: WA17 Expected date:  Expected time:  Means of arrival:  Comments: EMS/80 yo from SNF-short of breath

## 2014-07-13 NOTE — Progress Notes (Signed)
UR completed 

## 2014-07-13 NOTE — Consult Note (Signed)
Patient PI:RJJOACZ Nardelli      DOB: [birth year]      YSA:[id number]     Consult Note from the Palliative Medicine Team at [place] Requested by: Dr Charlies Silvers     PCP: [name], MD Reason for Consultation: Clarification of [place] and options     Phone Number:[phone]  Assessment of patients Current state:  Continued physical and functional decline secondary to multiple co-morbidities; overall failure to thrive.  Patient and family faced with advanced directive decisions and anticipatory care needs  Consult is for review of medical treatment options, clarification of goals of care and end of life issues, disposition and options, and symptom recommendation.  This NP Wadie Lessen reviewed medical records, received report from team, assessed the patient and then meet at the patient's bedside along with his wife and four daughter (two by conference call)  to discuss diagnosis, prognosis, GOC, EOL wishes disposition and options.  A detailed discussion was had today regarding advanced directives.  Concepts specific to code status, artifical feeding and hydration, continued IV antibiotics and rehospitalization was had.  The difference between a aggressive medical intervention path  and a palliative comfort care path for this patient at this time was had.  Values and goals of care important to patient and family were attempted to be elicited.  Concept of Hospice and Palliative Care were discussed  Natural trajectory and expectations at EOL were discussed.  Questions and concerns addressed.  Hard Choices booklet left for review. Family encouraged to call with questions or concerns.  PMT will continue to support holistically.   Goals of Care: 1.  Code Status: DNR/DNI   2. Scope of Treatment: Continue present medical treatment plan.  Family continues to process the situation and the decisions they face.  They plan to speak again tonight as a family and re-meet with me tomorrow at 3 pm  to further clarify goals  3. Disposition:  Dependant on outcomes.  Family is discussing the possibility of taking him home with hospice services.  Will continue to help family navigate options.   4. Symptom Management:   1. Anxiety/Agitation: Ativan 0.5 mg po bid prn 2.  Weakness: continue medical managment of acute/chronic disease until further clarification of GOC  5. Psychosocial:  Emotional support offered to family at bedside.  6. Spiritual:  Consulted     Patient Documents Completed or Given: Document Given Completed  Advanced Directives Pkt    MOST X   DNR  X  Gone from My [facility]ight    Hard Choices X     Brief HPI: 80 year old male with history of COPD recently discharged on 2 L home oxygen, chronic kidney disease stage IV, severe protein malnutrition, hypertension, recently diagnosed right upper lobe lung mass suspected to be bronchogenic carcinoma getting outpatient evaluation, ischemic cardiomyopathy with worsened EF on recent 2-D   Edward Nunez is a 80 y.o. male who was just discharged from our service on 9/29 after treatment for HCAP complicated by the presence of known lung mass with pleural effusion seen on CT on 9/11. He returns to the ED today with worsening SOB since discharge. He reports mild wheezing, occasional cough, but no fever.  Work up in the ED reveals hypoxia but this is improved on [place]. CXR shows persistence and possibly even worsening of pulmonary infiltrates since discharge.   ROS: unable to illicit due to increased lethargy   PMH:  Past Medical History  Diagnosis Date  . Hyperlipidemia   .  Hypertension   . CHF (congestive heart failure)   . COPD (chronic obstructive pulmonary disease)   . Cancer     prostate  . Postsurgical percutaneous transluminal coronary angioplasty (PTCA) status   . S/P coronary artery stent placement      PSH: Past Surgical History  Procedure Laterality Date  . Radioactive seed implant    . Colon resection     I  have reviewed the FH and SH and  If appropriate update it with new information. No Known Allergies Scheduled Meds: . antiseptic oral rinse  7 mL Mouth Rinse BID  . aspirin EC  81 mg Oral Daily  . atorvastatin  20 mg Oral QHS  . bisoprolol  5 mg Oral Daily  . [START ON 07/14/2014] ceFEPime (MAXIPIME) IV  1 g Intravenous Q24H  . feeding supplement (ENSURE COMPLETE)  237 mL Oral BID BM  . furosemide  20 mg Oral Daily  . heparin  5,000 Units Subcutaneous 3 times per day  . ipratropium-albuterol  3 mL Nebulization QID  . isosorbide-hydrALAZINE  1 tablet Oral TID  . megestrol  400 mg Oral BID  . [START ON 07/14/2014] vancomycin  500 mg Intravenous Q24H   Continuous Infusions:  PRN Meds:.ipratropium-albuterol, LORazepam    BP 93/48  Pulse 88  Temp(Src) 97.5 F (36.4 C) (Oral)  Resp 16  Ht 6' (1.829 m)  Wt 60.102 kg (132 lb 8 oz)  BMI 17.97 kg/m2  SpO2 97%   PPS: 30 % at best   Intake/Output Summary (Last 24 hours) at 07/13/14 1530 Last data filed at 07/13/14 0554  Gross per 24 hour  Intake     50 ml  Output      0 ml  Net     50 ml   LBM:PTA                        Physical Exam:  General:  Ill appearing, frail, lethargic HEENT:  Moist buccal membranes, no exudate Chest:   Decreased in bases CVS: RRR Abdomen: soft  Ext:  Without edema Neuro: arouses to gentle touch and verbal stimuli  Labs: CBC    Component Value Date/Time   WBC 7.2 07/13/2014 0246   WBC 7.6 06/01/2014 1827   RBC 3.67* 07/13/2014 0246   RBC 4.28* 06/01/2014 1827   HGB 10.2* 07/13/2014 0300   HGB 11.4* 06/01/2014 1827   HCT 30.0* 07/13/2014 0300   HCT 36.8* 06/01/2014 1827   PLT 265 07/13/2014 0246   MCV 78.5 07/13/2014 0246   MCV 86.0 06/01/2014 1827   MCH 27.0 07/13/2014 0246   MCH 26.7* 06/01/2014 1827   MCHC 34.4 07/13/2014 0246   MCHC 31.0* 06/01/2014 1827   RDW 15.3 07/13/2014 0246   LYMPHSABS 0.9 07/13/2014 0246   MONOABS 0.6 07/13/2014 0246   EOSABS 0.1 07/13/2014 0246   BASOSABS 0.0 07/13/2014  0246    BMET    Component Value Date/Time   NA 142 07/13/2014 0300   K 4.0 07/13/2014 0300   CL 104 07/13/2014 0300   CO2 25 07/03/2014 0529   GLUCOSE 112* 07/13/2014 0300   BUN 40* 07/13/2014 0300   CREATININE 1.70* 07/13/2014 0300   CREATININE 1.88* 06/20/2014 1325   CALCIUM 9.5 07/03/2014 0529   GFRNONAA 44* 07/05/2014 0542   GFRNONAA 55* 12/06/2013 0931   GFRAA 51* 07/05/2014 0542   GFRAA 63 12/06/2013 0931    CMP     Component Value Date/Time  NA 142 07/13/2014 0300   K 4.0 07/13/2014 0300   CL 104 07/13/2014 0300   CO2 25 07/03/2014 0529   GLUCOSE 112* 07/13/2014 0300   BUN 40* 07/13/2014 0300   CREATININE 1.70* 07/13/2014 0300   CREATININE 1.88* 06/20/2014 1325   CALCIUM 9.5 07/03/2014 0529   PROT 7.0 06/01/2014 1824   ALBUMIN 3.2* 06/01/2014 1824   AST 16 06/01/2014 1824   ALT 13 06/01/2014 1824   ALKPHOS 80 06/01/2014 1824   BILITOT 0.4 06/01/2014 1824   GFRNONAA 44* 07/05/2014 0542   GFRNONAA 55* 12/06/2013 0931   GFRAA 51* 07/05/2014 0542   GFRAA 63 12/06/2013 0931     Time In Time Out Total Time Spent with Patient Total Overall Time  1330 1500 70 min 75 min    Greater than 50%  of this time was spent counseling and coordinating care related to the above assessment and plan.   Wadie Lessen NP  Palliative Medicine Team Team Phone # [phone] Pager [phone]  Discussed with [facility]

## 2014-07-13 NOTE — ED Notes (Signed)
Per EMS-patient is from [facility]-cough for one month and shortness of breath for 1 days and states worsening over the course of the night

## 2014-07-13 NOTE — Progress Notes (Signed)
ANTIBIOTIC CONSULT NOTE - INITIAL  Pharmacy Consult for Vancomycin, cefepime  Indication: pneumonia  No Known Allergies  Patient Measurements: Height: 6' (182.9 cm) Weight: 132 lb 8 oz (60.102 kg) IBW/kg (Calculated) : 77.6 Adjusted Body Weight:   Vital Signs: Temp: 97.9 F (36.6 C) (10/05 0524) Temp Source: Oral (10/05 0524) BP: 129/61 mmHg (10/05 0524) Pulse Rate: 79 (10/05 0524) Intake/Output from previous day:   Intake/Output from this shift:    Labs:  Recent Labs  07/13/14 0246 07/13/14 0300  WBC 7.2  --   HGB 9.9* 10.2*  PLT 265  --   CREATININE  --  1.70*   Estimated Creatinine Clearance: 27 ml/min (by C-G formula based on Cr of 1.7). No results found for this basename: VANCOTROUGH, Corlis Leak, VANCORANDOM, [place], GENTPEAK, [place], [place], [place], TOBRARND, AMIKACINPEAK, AMIKACINTROU, AMIKACIN,  in the last 72 hours   Microbiology: Recent Results (from the past 720 hour(s))  CULTURE, BLOOD (ROUTINE X 2)     Status: None   Collection Time    07/03/14  1:41 PM      Result Value Ref Range Status   Specimen Description BLOOD RIGHT ANTECUBITAL   Final   Special Requests BOTTLES DRAWN AEROBIC AND ANAEROBIC 7 CC EA   Final   Culture  Setup Time     Final   Value: 07/03/2014 16:18     Performed at [place]     Final   Value: NO GROWTH 5 DAYS     Performed at [facility]   Report Status 07/11/2014 FINAL   Final  CULTURE, BLOOD (ROUTINE X 2)     Status: None   Collection Time    07/03/14  1:54 PM      Result Value Ref Range Status   Specimen Description BLOOD RIGHT HAND   Final   Special Requests BOTTLES DRAWN AEROBIC ONLY 3 CC   Final   Culture  Setup Time     Final   Value: 07/03/2014 16:17     Performed at [place]     Final   Value: NO GROWTH 5 DAYS     Performed at [facility]   Report Status 07/09/2014 FINAL   Final  EYE CULTURE     Status: None   Collection Time   07/06/14  6:58 PM      Result Value Ref Range Status   Specimen Description EYE   Final   Special Requests Normal   Final   Culture     Final   Value: FEW STAPHYLOCOCCUS SPECIES (COAGULASE NEGATIVE)     Performed at [facility]   Report Status 07/09/2014 FINAL   Final    Medical History: Past Medical History  Diagnosis Date  . Hyperlipidemia   . Hypertension   . CHF (congestive heart failure)   . COPD (chronic obstructive pulmonary disease)   . Cancer     prostate  . Postsurgical percutaneous transluminal coronary angioplasty (PTCA) status   . S/P coronary artery stent placement     Medications:  Anti-infectives   Start     Dose/Rate Route Frequency Ordered Stop   07/14/14 1000  ceFEPIme (MAXIPIME) 1 g in dextrose 5 % 50 mL IVPB     1 g 100 mL/hr over 30 Minutes Intravenous Every 24 hours 07/13/14 0537     07/14/14 0800  vancomycin (VANCOCIN) 500 mg in sodium chloride 0.9 % 100 mL IVPB  500 mg 100 mL/hr over 60 Minutes Intravenous Every 24 hours 07/13/14 0537     07/13/14 0600  ceFEPIme (MAXIPIME) 1 g in dextrose 5 % 50 mL IVPB  Status:  Discontinued     1 g 100 mL/hr over 30 Minutes Intravenous 3 times per day 07/13/14 0409 07/13/14 0421   07/13/14 0430  ceFEPIme (MAXIPIME) 2 g in dextrose 5 % 50 mL IVPB     2 g 100 mL/hr over 30 Minutes Intravenous  Once 07/13/14 0422     07/13/14 0330  vancomycin (VANCOCIN) IVPB 1000 mg/200 mL premix     1,000 mg 200 mL/hr over 60 Minutes Intravenous  Once 07/13/14 0319     07/13/14 0330  piperacillin-tazobactam (ZOSYN) IVPB 3.375 g     3.375 g 100 mL/hr over 30 Minutes Intravenous  Once 07/13/14 0319 07/13/14 0443     Assessment: Patient with PNA.  First dose of antibiotics already given or resent to floor.  Goal of Therapy:  Vancomycin trough level 15-20 mcg/ml Cefepime dosed based on patient weight and renal function   Plan:  Measure antibiotic drug levels at steady state Follow up culture results Vancomycin  500mg  iv q24hr Cefepime 1gm iv q24hr  Tyler Deis, Shea Stakes [place] 07/13/2014,5:39 AM

## 2014-07-13 NOTE — Progress Notes (Signed)
INITIAL NUTRITION ASSESSMENT  Pt meets criteria for severe MALNUTRITION in the context of chronic illness as evidenced by <75% estimated energy intake with 9% weight loss in the past month with visible severe muscle wasting and subcutaneous fat loss throughout body.   DOCUMENTATION CODES Per approved criteria  -Severe malnutrition in the context of chronic illness -Underweight   INTERVENTION: - Notified nurse tech of pt needing assistance feeding himself at mealtimes per family report - Continue Ensure Complete BID - Assisted family with ordering pt's meal - RD to continue to monitor   NUTRITION DIAGNOSIS: Inadequate oral intake related to poor appetite as evidenced by family report.   Goal: Pt to consume >90% of meals/supplements  Monitor:  Weights, labs, intake  Reason for Assessment: Malnutrition screening tool, consult for assessment   80 y.o. male  Admitting Dx: Acute respiratory failure with hypoxia  ASSESSMENT: Pt with recent diagnosis of right upper lobe lung mass suspected to be bronchogenic carcinoma, history of chronic kidney disease stage IV, severe protein calorie malnutrition, hypertension, COPD 12 L oxygen at home who was recently hospitalized for healthcare associated pneumonia. Patient presented back to [facility] ED 07/13/2014 with worsening shortness of breath since discharge, or occasional cough but no fever.   - Pt asleep during visit, daughter and others in room - Family reports pt has been eating "less and less" in the past week - Has been drinking 3-4 8oz cups of milk in the past few days and 1 Ensure/day - Needs help feeding himself at meals - Weight down 13 pounds unintentionally in the past month - Visibly severely cachetic throughout body   Height: Ht Readings from Last 1 Encounters:  07/13/14 6' (1.829 m)    Weight: Wt Readings from Last 1 Encounters:  07/13/14 132 lb 8 oz (60.102 kg)    Ideal Body Weight: 178 lbs   % Ideal Body Weight:  74%  Wt Readings from Last 10 Encounters:  07/13/14 132 lb 8 oz (60.102 kg)  07/07/14 123 lb 6.4 oz (55.974 kg)  06/20/14 132 lb 13.6 oz (60.26 kg)  06/09/14 145 lb (65.772 kg)  06/01/14 141 lb (63.957 kg)  05/26/14 139 lb 5.3 oz (63.2 kg)  01/21/14 144 lb 6.4 oz (65.499 kg)  12/28/13 154 lb (69.854 kg)  12/06/13 161 lb (73.029 kg)  04/28/13 164 lb 12.8 oz (74.753 kg)    Usual Body Weight: 145 lbs last month  % Usual Body Weight: 91%  BMI:  Body mass index is 17.97 kg/(m^2). Underweight  Estimated Nutritional Needs: Kcal: 2100-2300 Protein: 90-110g Fluid: 2.1-2.3L/day   Skin: intact   Diet Order: General  EDUCATION NEEDS: -No education needs identified at this time   Intake/Output Summary (Last 24 hours) at 07/13/14 1548 Last data filed at 07/13/14 0554  Gross per 24 hour  Intake     50 ml  Output      0 ml  Net     50 ml    Last BM: 10/2  Labs:   Recent Labs Lab 07/13/14 0300  NA 142  K 4.0  CL 104  BUN 40*  CREATININE 1.70*  GLUCOSE 112*    CBG (last 3)  No results found for this basename: GLUCAP,  in the last 72 hours  Scheduled Meds: . antiseptic oral rinse  7 mL Mouth Rinse BID  . aspirin EC  81 mg Oral Daily  . atorvastatin  20 mg Oral QHS  . bisoprolol  5 mg Oral Daily  . [START  ON 07/14/2014] ceFEPime (MAXIPIME) IV  1 g Intravenous Q24H  . feeding supplement (ENSURE COMPLETE)  237 mL Oral BID BM  . furosemide  20 mg Oral Daily  . heparin  5,000 Units Subcutaneous 3 times per day  . ipratropium-albuterol  3 mL Nebulization QID  . isosorbide-hydrALAZINE  1 tablet Oral TID  . megestrol  400 mg Oral BID  . [START ON 07/14/2014] vancomycin  500 mg Intravenous Q24H    Continuous Infusions:   Past Medical History  Diagnosis Date  . Hyperlipidemia   . Hypertension   . CHF (congestive heart failure)   . COPD (chronic obstructive pulmonary disease)   . Cancer     prostate  . Postsurgical percutaneous transluminal coronary angioplasty  (PTCA) status   . S/P coronary artery stent placement     Past Surgical History  Procedure Laterality Date  . Radioactive seed implant    . Colon resection      Carlis Stable MS, RD, LDN [phone] Pager [phone] Weekend/After Hours Pager

## 2014-07-13 NOTE — Progress Notes (Addendum)
Patient ID: Edward Nunez, male   DOB: 1925-12-23, 80 y.o.   MRN: [id number] [facility] PROGRESS NOTE  Edward Nunez QIO:[id number] DOB: 07-Apr-1926 DOA: 07/13/2014 PCP: [name], MD  Brief narrative: 80 y.o. male with recent diagnosis of right upper lobe lung mass suspected to be bronchogenic carcinoma, history of chronic kidney disease stage IV, severe protein calorie malnutrition, hypertension, COPD on oxygen at home who was recently hospitalized for healthcare associated pneumonia. Patient presented back to [facility] ED 07/13/2014 with worsening shortness of breath since discharge, cough but no fever. On admission vital signs were stable. Blood work revealed hemoglobin of 9.9 and creatinine of 1.7 otherwise no other blood work abnormalities. Chest x-ray showed similar right upper lobe opacities worrisome for possible underlying malignancy, superimposed infiltrates within the right upper and lower lobes stable but could be related to underlying malignancy or superimposed pneumonia. Patient was started on broad-spectrum antibiotics, vancomycin and cefepime for treatment of healthcare associated pneumonia.   Assessment/Plan:    Principal Problem:   Acute respiratory failure with hypoxia  Hypoxic respiratory failure likely secondary to combination of new suspected bronchogenic carcinoma, COPD exacerbation and healthcare associated pneumonia.  Chest x-ray on the admission was significant for similar right upper lobe opacities worrisome for possible underlying malignancy, superimposed infiltrates within the right upper and lower lobes stable but could be related to underlying malignancy or superimposed pneumonia.  Continue current broad-spectrum antibiotics, vancomycin and cefepime. Blood cultures are pending.   Continue duo neb 4 times a day scheduled and every 4 hours as needed for shortness of breath and wheezing.   Continue oxygen support via nasal cannula to keep oxygen saturation above  90%.  appreciate palliative care to address goals of care with the patient and his family.  Active Problems: HCAP (healthcare-associated pneumonia)  as mentioned above, continue vancomycin and cefepime. Followup blood culture results. Continue oxygen support via nasal cannula to keep oxygen saturation above 90%. Acute COPD exacerbation  Management with DuoNeb every 2 hours as needed and every 6 hours scheduled.  Respiratory status is stable.  Continue antibiotics for pneumonia. Oxygen support via nasal cannula to keep oxygen saturation above 90%.  COPD Gold alert ordered. Acute decompensated systolic and diastolic CHF    most recent 2D echo in August 2015 showed ejection fraction of 20%. BNP on this admission is in 2900 range, one week ago in 2700 range. Patient is on low dose Lasix 20 mg daily. Will give patient small bolus of 20 mg Lasix IV today.  Continue statin therapy, continue Bidil for blood pressure control. Essential hypertension  Continue Bidil. Lung mass,  suspected bronchogenic carcinoma   Recent CT chest with contrast on 06/19/2014 showed small right upper lobe mass with right pleural effusion and lymphadenopathy suspicious for primary bronchogenic ca.  As per pulmonary note recently PET scan planned as outpt. Given patinet's age, severe malnutrition, chronic respiratory failure and severe cardiomyopathy he is unlikely a candidate for any chemotherapy or radiation. PCP recommended palliative care/ hospice.  Protein-calorie malnutrition, severe   Continue ensure. Regular diet as tolerated.   DVT Prophylaxis   Heparin subcutaneous order.   Code Status: DNR/DNI  Family Communication:  Family not at the bedside this morning.  Disposition Plan:  remains inpatient.    IV Access:   Peripheral IV Procedures and diagnostic studies:    Dg Chest 2 View 07/13/2014  1. Grossly similar right upper lobe opacities, worrisome for possible underlying malignancy, better  evaluated on prior CT from 06/19/2014. Superimposed infiltrates within  the right upper and lower lobes also stable, with may be related to underlying malignancy or superimposed pneumonia. 2. Persistent right pleural effusion with associated right basilar atelectasis. 3. Emphysema.    Medical Consultants:   Palliative care consult Other Consultants:   None Anti-Infectives:   Cefepime 07/13/2014 --> Vancomycin 07/13/2014 -->   [name], MD  [facility] Pager [phone]  If 7PM-7AM, please contact night-coverage [email or web address] Password [email or web address] 07/13/2014, 11:01 AM   LOS: 0 days    HPI/Subjective: No acute overnight events.  Objective: Filed Vitals:   07/13/14 0443 07/13/14 0515 07/13/14 0524 07/13/14 0952  BP: 118/64  129/61   Pulse:   79   Temp:   97.9 F (36.6 C)   TempSrc:   Oral   Resp: 28  16   Height:  6' (1.829 m) 6' (1.829 m)   Weight:  55.792 kg (123 lb) 60.102 kg (132 lb 8 oz)   SpO2:   99% 98%    Intake/Output Summary (Last 24 hours) at 07/13/14 1101 Last data filed at 07/13/14 0554  Gross per 24 hour  Intake     50 ml  Output      0 ml  Net     50 ml    Exam:   General:  Pt is not in distress, sleeping but easily arousable  Cardiovascular: Regular rate and rhythm, S1/S2 appreciated  Respiratory: Coarse breath sounds, wheezing and upper lung lobes  Abdomen: Soft, non tender, non distended, bowel sounds present  Extremities: No edema, pulses DP and PT palpable bilaterally  Neuro: Grossly nonfocal  Data Reviewed: Basic Metabolic Panel:  Recent Labs Lab 07/13/14 0300  NA 142  K 4.0  CL 104  GLUCOSE 112*  BUN 40*  CREATININE 1.70*   Liver Function Tests: No results found for this basename: AST, ALT, ALKPHOS, BILITOT, PROT, ALBUMIN,  in the last 168 hours No results found for this basename: LIPASE, AMYLASE,  in the last 168 hours No results found for this basename: AMMONIA,  in the last 168 hours CBC:  Recent Labs Lab  07/13/14 0246 07/13/14 0300  WBC 7.2  --   NEUTROABS 5.6  --   HGB 9.9* 10.2*  HCT 28.8* 30.0*  MCV 78.5  --   PLT 265  --    Cardiac Enzymes: No results found for this basename: CKTOTAL, CKMB, CKMBINDEX, TROPONINI,  in the last 168 hours BNP: No components found with this basename: POCBNP,  CBG: No results found for this basename: GLUCAP,  in the last 168 hours  CULTURE, BLOOD (ROUTINE X 2)     Status: None   Collection Time    07/03/14  1:41 PM      Result Value Ref Range Status   Specimen Description BLOOD RIGHT ANTECUBITAL   Final   Value: NO GROWTH 5 DAYS     Performed at [facility]   Report Status 07/11/2014 FINAL   Final  CULTURE, BLOOD (ROUTINE X 2)     Status: None   Collection Time    07/03/14  1:54 PM      Result Value Ref Range Status   Specimen Description BLOOD RIGHT HAND   Final   Value: NO GROWTH 5 DAYS     Performed at [facility]   Report Status 07/09/2014 FINAL   Final  EYE CULTURE     Status: None   Collection Time    07/06/14  6:58 PM      Result Value  Ref Range Status   Specimen Description EYE   Final   Special Requests Normal   Final   Culture     Final   Value: FEW STAPHYLOCOCCUS SPECIES (COAGULASE NEGATIVE)     Performed at [facility]   Report Status 07/09/2014 FINAL   Final  MRSA PCR SCREENING     Status: None   Collection Time    07/13/14  5:11 AM      Result Value Ref Range Status   MRSA by PCR NEGATIVE  NEGATIVE Final     Scheduled Meds: . aspirin EC  81 mg Oral Daily  . atorvastatin  20 mg Oral QHS  . bisoprolol  5 mg Oral Daily  .  ceFEPime (MAXIPIME)  1 g Intravenous Q24H  . feeding supplement   237 mL Oral BID BM  . furosemide  20 mg Oral Daily  . heparin  5,000 Units Subcutaneous 3 times per day  . ipratropium-albuterol  3 mL Nebulization QID  . isosorbide-hydrALAZINE  1 tablet Oral TID  . megestrol  400 mg Oral BID  .  vancomycin  500 mg Intravenous Q24H

## 2014-07-14 ENCOUNTER — Telehealth: Payer: Self-pay

## 2014-07-14 DIAGNOSIS — R0600 Dyspnea, unspecified: Secondary | ICD-10-CM

## 2014-07-14 MED ORDER — METHYLPREDNISOLONE SODIUM SUCC 40 MG IJ SOLR
40.0000 mg | INTRAMUSCULAR | Status: DC
  Administered 2014-07-14 – 2014-07-16 (×3): 40 mg via INTRAVENOUS
  Filled 2014-07-14 (×4): qty 1

## 2014-07-14 MED ORDER — IPRATROPIUM-ALBUTEROL 0.5-2.5 (3) MG/3ML IN SOLN
3.0000 mL | RESPIRATORY_TRACT | Status: DC | PRN
  Administered 2014-07-14: 3 mL via RESPIRATORY_TRACT
  Filled 2014-07-14: qty 3

## 2014-07-14 NOTE — Progress Notes (Signed)
Pharmacist Heart Failure Core Measure Documentation  Assessment: Edward Nunez has an EF documented as 20% on 05/17/14 by ECHO.  Rationale: Heart failure patients with left ventricular systolic dysfunction (LVSD) and an EF < 40% should be prescribed an angiotensin converting enzyme inhibitor (ACEI) or angiotensin receptor blocker (ARB) at discharge unless a contraindication is documented in the medical record.  This patient is not currently on an ACEI or ARB for HF.  This note is being placed in the record in order to provide documentation that a contraindication to the use of these agents is present for this encounter.  ACE Inhibitor or Angiotensin Receptor Blocker is contraindicated (specify all that apply)  []   ACEI allergy AND ARB allergy []   Angioedema []   Moderate or severe aortic stenosis []   Hyperkalemia []   Hypotension []   Renal artery stenosis [x]   Worsening renal function, preexisting renal disease or dysfunction   Edward Nunez 07/14/2014 7:56 AM

## 2014-07-14 NOTE — Evaluation (Addendum)
Occupational Therapy Evaluation Patient Details Name: Edward Nunez MRN: [id number] DOB: [birth year] Today's Date: 07/14/2014    History of Present Illness 80 year old man who was admitted from [facility] with acute respiratory failure with hypoxia.  He has a R lung mass which is suspected to be bronchiogenic carcinoma, COPD and pna.  Pt has EF of 20%.  Also had CKD, HTN and ischemic cardiomyopathy   Clinical Impression   Pt was admitted for the above.  He was recently transferred to [facility] for rehab, and prior to this had a daughter staying with him (who is from [facility]).  Family is trying to work out 24/7 assistance.  Pt could return home with 24/7 but would need SNF if this is unavailable.  Pt needed assistance with adls prior to this admission, and he is limited by endurance at this time.  Pt is good about taking rest breaks.  OT in acute will focus on increased activity tolerance and safety.    Follow Up Recommendations  Supervision/Assistance - 24 hour;SNF    Equipment Recommendations  3 in 1 bedside comode    Recommendations for Other Services       Precautions / Restrictions Precautions Precautions: Fall Restrictions Weight Bearing Restrictions: No      Mobility Bed Mobility         Supine to sit: Min guard     General bed mobility comments: extra time, guarding lines  Transfers   Equipment used: Rolling walker (2 wheeled) Transfers: Sit to/from Stand Sit to Stand: Min guard Stand pivot transfers: Min assist       General transfer comment: close guard for safety; assist to control descent    Balance                                            ADL Overall ADL's : Needs assistance/impaired Eating/Feeding: Set up (drinking ensure; not eating per daughter)   Grooming: Set up;Sitting   Upper Body Bathing: Sitting;Minimal assitance   Lower Body Bathing: Moderate assistance;Sit to/from stand   Upper Body Dressing : Set up;Sitting    Lower Body Dressing: Maximal assistance;Sit to/from stand   Toilet Transfer: Minimal assistance;Stand-pivot (to recliner)             General ADL Comments: pt given rest breaks between activities.  He was very cold during evaluation.  Had help with adls from daughter prior to admission to SNF.  He was only at [facility] for days prior to admission.  Pt with decreased activity tolerance     Vision                     Perception     Praxis      Pertinent Vitals/Pain Pain Assessment: No/denies pain     Hand Dominance     Extremity/Trunk Assessment Upper Extremity Assessment Upper Extremity Assessment: Generalized weakness           Communication Communication Communication: No difficulties   Cognition Arousal/Alertness: Awake/alert Behavior During Therapy: WFL for tasks assessed/performed Overall Cognitive Status: Within Functional Limits for tasks assessed                     General Comments       Exercises       Shoulder Instructions      Home Living Family/patient expects to be discharged to::  Unsure                                 Additional Comments: family is trying to see if they can arrange 24/7 amongst children.  Came from [facility], for which he was receiving rehab after last admission      Prior Functioning/Environment Level of Independence: Needs assistance             OT Diagnosis: Generalized weakness   OT Problem List: Decreased strength;Decreased activity tolerance;Cardiopulmonary status limiting activity;Decreased knowledge of use of DME or AE   OT Treatment/Interventions: Self-care/ADL training;DME and/or AE instruction;Therapeutic activities;Patient/family education;Energy conservation    OT Goals(Current goals can be found in the care plan section) Acute Rehab OT Goals Patient Stated Goal: none stated; agreeable to OT OT Goal Formulation: With patient Time For Goal Achievement: 07/28/14 Potential to  Achieve Goals: Good ADL Goals Pt Will Transfer to Toilet: with supervision;bedside commode;stand pivot transfer Pt Will Perform Toileting - Clothing Manipulation and hygiene: with supervision;sit to/from stand Additional ADL Goal #1: pt will verbalize use of AE for energy conservation vs. having assistance  OT Frequency: Min 2X/week   Barriers to D/C:            Co-evaluation              End of Session    Activity Tolerance: Patient limited by fatigue Patient left: in chair;with call bell/phone within reach   Time: 1006-1026 OT Time Calculation (min): 20 min Charges:  OT General Charges $OT Visit: 1 Procedure OT Evaluation $Initial OT Evaluation Tier I: 1 Procedure OT Treatments $Therapeutic Activity : 8-22 mins G-Codes:    Jabar Krysiak July 29, 2014, 10:40 AM  Lesle Chris, OTR/L [phone] 29-Jul-2014

## 2014-07-14 NOTE — Progress Notes (Signed)
Clinical Social Work Department BRIEF PSYCHOSOCIAL ASSESSMENT 07/14/2014  Patient:  Edward Nunez, Edward Nunez     Account Number:  [id number]     Admit date:  07/13/2014  Clinical Social Worker:  Ulyess Blossom  Date/Time:  07/14/2014 03:58 PM  Referred by:  Physician  Date Referred:  07/14/2014 Referred for  SNF Placement   Other Referral:   pt admitted from [facility] and [place]; PMT [place] held today at 3 pm   Interview type:  Patient Other interview type:   and patient family at bedside    PSYCHOSOCIAL DATA Living Status:  FACILITY Admitted from facility:  [place] Level of care:  [place] Primary support name:  [name] Leggitts/[phone] Primary support relationship to patient:  CHILD, ADULT Degree of support available:   strong    CURRENT CONCERNS Current Concerns  Post-Acute Placement   Other Concerns:    SOCIAL WORK ASSESSMENT / PLAN CSW received referral that pt admitted from [facility] and [place]. PMT [place] meeting held at 3 pm and CSW received update from PMT NP, Wadie Lessen that goals established with pt and pt daughters and pt and pt family wish for pt to return home with hospice care upon discharge.    CSW met with pt and pt family at bedside. CSW introduced self and explained role. CSW discussed that CSW was initially consulted due to pt admitting from [place] discussed with pt family that CSW aware that pt and pt family wish to return home with hospice care upon discharge and RNCM will be coming to discuss home hospice care with pt and pt family. CSW provided support and pt family very eager to discuss with RNCM that home hospice services.    RNCM arrived at this time and CSW exited room to allow RNCM time to meet with the family.    RNCM reported to CSW that pt and pt family have chosen [facility]osp[facility] and [place] and pt will require ambulance transport home upon discharge.    CSW  notified [facility] and Rehab that pt will not be returning to facility.    CSW to continue to follow and assist with ambulance transport home upon discharge.   Assessment/plan status:  Psychosocial Support/Ongoing Assessment of Needs Other assessment/ plan:   discharge planning   Information/referral to community resources:   Referral to [facility] for Home Hospice Services    PATIENT'S/FAMILY'S RESPONSE TO PLAN OF CARE: Pt alert and oriented x 4. Pt expressed that he was glad that plan is to return home. Pt and pt family eager to speak with RNCM regarding home hospice services. Pt has strong support from pt daughters. CSW to continue to follow to assist with transport home upon discharge.    Alison Murray, MSW, [place] Work [phone]

## 2014-07-14 NOTE — Care Management Note (Signed)
CARE MANAGEMENT NOTE 07/14/2014  Patient:  Edward Nunez, Edward Nunez   Account Number:  [id number]  Date Initiated:  07/14/2014  Documentation initiated by:  Marney Doctor  Subjective/Objective Assessment:   80 yo male admitted with Acute respiratory failure with hypoxia.     Action/Plan:   From Ritta Slot   Anticipated DC Date:  07/16/2014   Anticipated DC Plan:  HOME W HOSPICE CARE  In-house referral  Clinical Social Worker      DC Forensic scientist  CM consult      PAC Choice  HOSPICE   Choice offered to / List presented to:  C-4 Adult Children           HH agency  [facility] [place]   Status of service:  In process, will continue to follow Medicare Important Message given?   (If response is "NO", the following Medicare IM given date fields will be blank) Date Medicare IM given:   Medicare IM given by:   Date Additional Medicare IM given:   Additional Medicare IM given by:    Discharge Disposition:    Per UR Regulation:  Reviewed for med. necessity/level of care/duration of stay  If discussed at [place] of Stay Meetings, dates discussed:    Comments:  07/14/14 Marney Doctor RN,BSN,NCM Was asked to see pt about going home with Hospice care. Met with pt and 4 daughters (one on conference call). Choice of Hospice providers was given and they decided on [facility].  [facility] rep called to give referral.  CM will continue to follow.

## 2014-07-14 NOTE — Progress Notes (Signed)
Patient ID: Edward Nunez, male   DOB: [birth year], 80 y.o.   MRN: [id number] [facility] PROGRESS NOTE  Kell Ferris [id number] DOB: [birth year] DOA: 07/13/2014 PCP: [name], MD  Brief narrative: 80 y.o. male with recent diagnosis of right upper lobe lung mass suspected to be bronchogenic carcinoma, history of chronic kidney disease stage IV, severe protein calorie malnutrition, hypertension, COPD on 2 L oxygen at home who was recently hospitalized for healthcare associated pneumonia. Patient came back to [facility] ED 07/13/2014 with worsening shortness of breath and cough ever since discharge. On admission vital signs were stable. Blood work revealed hemoglobin of 9.9 and creatinine of 1.7 otherwise unremarkable. Chest x-ray showed similar right upper lobe opacities worrisome for possible underlying malignancy, superimposed infiltrates within the right upper and lower lobes stable but could be related to underlying malignancy or superimposed pneumonia. Patient was started on broad-spectrum antibiotics, vancomycin and cefepime for treatment of healthcare associated pneumonia. Palliative care was consulted for assistance with goals of care.  Assessment/Plan:   Principal Problem:  Acute respiratory failure with hypoxia  Hypoxic respiratory failure likely secondary to combination of new suspected bronchogenic carcinoma, COPD exacerbation and healthcare associated pneumonia.  Chest x-ray on the admission was significant for similar right upper lobe opacities worrisome for possible underlying malignancy, superimposed infiltrates within the right upper and lower lobes stable but could be related to underlying malignancy or superimposed pneumonia.  We will continue broad-spectrum antibiotics, vancomycin and cefepime. Blood cultures are still pending.  Continue duo neb 4 times a day scheduled and every 4 hours as needed for shortness of breath and wheezing. Added Solu-Medrol 40 mg IV daily. Continue  oxygen support via nasal cannula to keep oxygen saturation above 90%.  Plan is for family to have goals of care meeting with palliative care team today. Active Problems:  HCAP (healthcare-associated pneumonia)  Continue vancomycin and cefepime. Blood culture results pending.  Continue oxygen support via nasal cannula to keep oxygen saturation above 90%. Acute COPD exacerbation  Management with DuoNeb every 2 hours as needed and every 6 hours scheduled. Added Solu-Medrol 40 mg IV every 24 hours. Continue antibiotics for pneumonia. Oxygen support via nasal cannula to keep oxygen saturation above 90%.  Acute decompensated systolic and diastolic CHF  Most recent 2D echo in August 2015 showed ejection fraction of 20%. BNP on this admission is in 2900 range, one week ago in 2700 range. Patient is on low dose Lasix 20 mg daily. Given small bolus of 20 mg Lasix IV 10/5. HIs blood pressure was stable SBP in 120's at the time lasix given. Later on in a day SBP dropped to mid 90's. Patinet was given a bolus of normal saline 500 cc. All antihypertensive medications are on hold at this time. May continue aspirin daily. No evidence of bleeding. Essential hypertension  Bidil, bisoprolol and lasix on hold due to low blood pressure. Lung mass, suspected bronchogenic carcinoma  Recent CT chest with contrast on 06/19/2014 showed small right upper lobe mass with right pleural effusion and lymphadenopathy suspicious for primary bronchogenic ca. As per pulmonary note recently PET scan planned as outpt. Given patinet's age, severe malnutrition, chronic respiratory failure and severe cardiomyopathy he is unlikely a candidate for any chemotherapy or radiation. PCP recommended palliative care/ hospice.  Palliative care team will meet with the family to discuss the goals of care. Anemia of chronic disease  Likely secondary to malignancy, poor nutritional status  Hemoglobin is 10.2 this morning. No current indications for  transfusion. Chronic  kidney disease, stage IV  Creatinine on 06/20/2014 was 1.8 and on this admission 1.7.  Continue to monitor kidney function. Dyslipidemia  Continue Lipitor 20 mg at bedtime. Protein-calorie malnutrition, severe  Continue ensure. Continue Megace 400 mg by mouth twice a day. Diet liberalized to regular so patient can improve by mouth intake. DVT Prophylaxis  Heparin subcutaneous order.   Code Status: DNR/DNI  Family Communication: Family at the bedside Disposition Plan: remains inpatient.    IV Access:   Peripheral IV  Procedures and diagnostic studies:    Dg Chest 2 View 07/13/2014 1. Grossly similar right upper lobe opacities, worrisome for possible underlying malignancy, better evaluated on prior CT from 06/19/2014. Superimposed infiltrates within the right upper and lower lobes also stable, with may be related to underlying malignancy or superimposed pneumonia. 2. Persistent right pleural effusion with associated right basilar atelectasis. 3. Emphysema.   Medical Consultants:   Palliative care consult Nutrition Physical therapy  Other Consultants:   None  Anti-Infectives:   Cefepime 07/13/2014 -->  Vancomycin 07/13/2014 -->   [name], MD  [facility] Pager [phone]  If 7PM-7AM, please contact night-coverage [email or web address] Password [email or web address] 07/14/2014, 6:04 AM   LOS: 1 day    HPI/Subjective: No acute overnight events.  Objective: Filed Vitals:   07/13/14 1900 07/13/14 2004 07/13/14 2332 07/14/14 0454  BP: 112/68  120/64 114/58  Pulse: 80  84 77  Temp:   98.3 F (36.8 C) 98 F (36.7 C)  TempSrc:   Oral Axillary  Resp:   24 22  Height:      Weight:      SpO2: 98% 95% 97% 100%   No intake or output data in the 24 hours ending 07/14/14 0604  Exam:   General:  Pt is alert, no distress  Cardiovascular: Regular rate and rhythm, S1/S2 appreciated   Respiratory: wheezing in upper lung lobes, rhonchi in right upper  lung  Abdomen: Soft, non tender, non distended, bowel sounds present  Extremities: No edema, pulses DP and PT palpable bilaterally  Neuro: Grossly nonfocal  Data Reviewed: Basic Metabolic Panel:  Recent Labs Lab 07/13/14 0300  NA 142  K 4.0  CL 104  GLUCOSE 112*  BUN 40*  CREATININE 1.70*   Liver Function Tests: No results found for this basename: AST, ALT, ALKPHOS, BILITOT, PROT, ALBUMIN,  in the last 168 hours No results found for this basename: LIPASE, AMYLASE,  in the last 168 hours No results found for this basename: AMMONIA,  in the last 168 hours CBC:  Recent Labs Lab 07/13/14 0246 07/13/14 0300  WBC 7.2  --   NEUTROABS 5.6  --   HGB 9.9* 10.2*  HCT 28.8* 30.0*  MCV 78.5  --   PLT 265  --    Cardiac Enzymes: No results found for this basename: CKTOTAL, CKMB, CKMBINDEX, TROPONINI,  in the last 168 hours BNP: No components found with this basename: POCBNP,  CBG: No results found for this basename: GLUCAP,  in the last 168 hours  Recent Results (from the past 240 hour(s))  EYE CULTURE     Status: None   Collection Time    07/06/14  6:58 PM      Result Value Ref Range Status   Specimen Description EYE   Final   Special Requests Normal   Final   Culture     Final   Value: FEW STAPHYLOCOCCUS SPECIES (COAGULASE NEGATIVE)     Performed at [place]  Status 07/09/2014 FINAL   Final  MRSA PCR SCREENING     Status: None   Collection Time    07/13/14  5:11 AM      Result Value Ref Range Status   MRSA by PCR NEGATIVE  NEGATIVE Final     Scheduled Meds: . aspirin EC  81 mg Oral Daily  . atorvastatin  20 mg Oral QHS  . bisoprolol  5 mg Oral Daily  . ceFEPime (MAXIPIME) IV  1 g Intravenous Q24H  .  (ENSURE COMPLETE)  237 mL Oral BID BM  . furosemide  20 mg Oral Daily  . heparin  5,000 Units Subcutaneous 3 times per day  . ipratropium-albuterol  3 mL Nebulization QID  . megestrol  400 mg Oral BID  . vancomycin  500 mg Intravenous Q24H

## 2014-07-14 NOTE — Telephone Encounter (Addendum)
Patient's daughter calling again. States that this is an urgent situation. Her father is being discharged from the hospital today and they need the hospice information.         [phone] Langley Gauss daughter)

## 2014-07-14 NOTE — Telephone Encounter (Signed)
Edward Nunez states her father is dying and will be released from the hospital in rhe next 48 to 72 hrs. Was told by Dr Linna Darner he would set up for pt to have [facility], they do not want the hospital to do it Please call [phone] really want [name] to get in touch with Dr Marveen Reeks have also contacted Medicare and [facility]

## 2014-07-14 NOTE — Evaluation (Signed)
Physical Therapy Evaluation Patient Details Name: Edward Nunez MRN: [id number] DOB: 08/23/1926 Today's Date: 07/14/2014   History of Present Illness  80 year old man who was admitted from [facility] with acute respiratory failure with hypoxia.  He has a R lung mass which is suspected to be bronchiogenic carcinoma, COPD and pna.  Pt has EF of 20%.  Also had CKD, HTN and ischemic cardiomyopathy  Clinical Impression  Pt currently with functional limitations due to the deficits listed below (see PT Problem List). Pt will benefit from skilled PT to increase their independence and safety with mobility to allow discharge to the venue listed below.   Pt with limited mobility due to DOE at this time.  Would benefit from ST-SNF if family agreeable however daughter reports family is trying to take pt home with more care.  Pt and family to have palliative care meeting.  Will continue to follow.     Follow Up Recommendations SNF;Supervision/Assistance - 24 hour    Equipment Recommendations  None recommended by PT    Recommendations for Other Services       Precautions / Restrictions Precautions Precautions: Fall Precaution Comments: DOE      Mobility  Bed Mobility Overal bed mobility: Needs Assistance Bed Mobility: Supine to Sit     Supine to sit: Supervision     General bed mobility comments: increased time, cues to finish task  Transfers Overall transfer level: Needs assistance Equipment used: Rolling walker (2 wheeled) Transfers: Sit to/from Stand Sit to Stand: Min assist         General transfer comment: verbal cues for hand placement, assist to control descent  Ambulation/Gait Ambulation/Gait assistance: Min assist Ambulation Distance (Feet): 40 Feet Assistive device: Rolling walker (2 wheeled) Gait Pattern/deviations: Step-through pattern;Narrow base of support Gait velocity: decr   General Gait Details: fatigues very quickly, slow gait, DOE, ambulated on 3L O2 as  pt reports he increases oxygen with activity, distance limited by SOB, cues for pursed lip breathing  Stairs            Wheelchair Mobility    Modified Rankin (Stroke Patients Only)       Balance                                             Pertinent Vitals/Pain Pain Assessment: No/denies pain    Home Living Family/patient expects to be discharged to:: Unsure     Type of Home: House Home Access: Stairs to enter   Technical brewer of Steps: 2 Home Layout: Two level;Bed/bath upstairs Home Equipment: Clinical cytogeneticist - 2 wheels Additional Comments: daughter from [place] states family is trying to arrange care and palliative care meeting scheduled later today    Prior Function Level of Independence: Needs assistance   Gait / Transfers Assistance Needed: uses RW, has been at SNF for a week, reports increased in O2 to 3L with exertion (2L at rest currently)           Hand Dominance        Extremity/Trunk Assessment               Lower Extremity Assessment: Generalized weakness (diffuse muscle atrophy throughout)         Communication   Communication: No difficulties  Cognition Arousal/Alertness: Awake/alert Behavior During Therapy: WFL for tasks assessed/performed Overall Cognitive Status: Within Functional Limits for tasks  assessed                      General Comments      Exercises        Assessment/Plan    PT Assessment Patient needs continued PT services  PT Diagnosis Generalized weakness   PT Problem List Decreased strength;Decreased activity tolerance;Decreased mobility;Decreased knowledge of use of DME;Cardiopulmonary status limiting activity  PT Treatment Interventions Gait training;Functional mobility training;Therapeutic activities;Therapeutic exercise;Patient/family education;DME instruction   PT Goals (Current goals can be found in the Care Plan section) Acute Rehab PT Goals PT Goal  Formulation: With patient/family Time For Goal Achievement: 07/21/14 Potential to Achieve Goals: Good    Frequency Min 2X/week   Barriers to discharge        Co-evaluation               End of Session Equipment Utilized During Treatment: Oxygen Activity Tolerance: Patient limited by fatigue Patient left: in chair;with call bell/phone within reach;with family/visitor present           Time: 1425-1443 PT Time Calculation (min): 18 min   Charges:   PT Evaluation $Initial PT Evaluation Tier I: 1 Procedure PT Treatments $Gait Training: 8-22 mins   PT G Codes:          Caven Perine,KATHrine E 07/14/2014, 4:01 PM Carmelia Bake, PT, DPT 07/14/2014 Pager: [phone]

## 2014-07-14 NOTE — Consult Note (Signed)
I have reviewed and discussed case with Nurse Practitioner And agree with documentation and plan as noted above   Ashton Sabine J. Skyley Grandmaison D.O.  Palliative Medicine Team at [place]  Team Phone: [phone]    

## 2014-07-15 ENCOUNTER — Telehealth: Payer: Self-pay | Admitting: Family Medicine

## 2014-07-15 DIAGNOSIS — E43 Unspecified severe protein-calorie malnutrition: Secondary | ICD-10-CM

## 2014-07-15 DIAGNOSIS — D638 Anemia in other chronic diseases classified elsewhere: Secondary | ICD-10-CM

## 2014-07-15 DIAGNOSIS — I429 Cardiomyopathy, unspecified: Secondary | ICD-10-CM

## 2014-07-15 DIAGNOSIS — L03211 Cellulitis of face: Secondary | ICD-10-CM

## 2014-07-15 DIAGNOSIS — R0600 Dyspnea, unspecified: Secondary | ICD-10-CM

## 2014-07-15 DIAGNOSIS — R636 Underweight: Secondary | ICD-10-CM | POA: Diagnosis present

## 2014-07-15 LAB — BASIC METABOLIC PANEL
Anion gap: 11 (ref 5–15)
BUN: 36 mg/dL — AB (ref 6–23)
CO2: 24 mEq/L (ref 19–32)
Calcium: 9.4 mg/dL (ref 8.4–10.5)
Chloride: 105 mEq/L (ref 96–112)
Creatinine, Ser: 1.37 mg/dL — ABNORMAL HIGH (ref 0.50–1.35)
GFR, EST AFRICAN AMERICAN: 53 mL/min — AB (ref >90)
GFR, EST NON AFRICAN AMERICAN: 45 mL/min — AB (ref >90)
GLUCOSE: 128 mg/dL — AB (ref 70–99)
POTASSIUM: 5.3 meq/L (ref 3.7–5.3)
Sodium: 140 mEq/L (ref 137–147)

## 2014-07-15 MED ORDER — DEXTROSE 5 % IV SOLN
1.0000 g | Freq: Two times a day (BID) | INTRAVENOUS | Status: DC
  Administered 2014-07-15 – 2014-07-16 (×3): 1 g via INTRAVENOUS
  Filled 2014-07-15 (×5): qty 1

## 2014-07-15 MED ORDER — LORAZEPAM 1 MG PO TABS: 1.0000 mg | ORAL_TABLET | Freq: Four times a day (QID) | ORAL | Status: DC | PRN

## 2014-07-15 MED ORDER — VANCOMYCIN HCL IN DEXTROSE 750-5 MG/150ML-% IV SOLN
750.0000 mg | INTRAVENOUS | Status: DC
  Administered 2014-07-16: 750 mg via INTRAVENOUS
  Filled 2014-07-15 (×2): qty 150

## 2014-07-15 MED ORDER — MORPHINE SULFATE (CONCENTRATE) 10 MG /0.5 ML PO SOLN
5.0000 mg | ORAL | Status: DC | PRN
  Administered 2014-07-15 – 2014-07-16 (×4): 5 mg via ORAL
  Filled 2014-07-15 (×4): qty 0.5

## 2014-07-15 NOTE — Progress Notes (Addendum)
Progress Note   [name] VZD:[id number] DOB: 1926-05-04 DOA: 07/13/2014 PCP: [name], MD   Brief Narrative:   Edward Nunez is an 80 y.o. male with recent diagnosis of right upper lobe lung mass suspected to be bronchogenic carcinoma, history of chronic kidney disease stage IV, severe protein calorie malnutrition, hypertension, COPD/chronic respiratory failure on 2 L oxygen at home who was recently hospitalized for healthcare associated pneumonia. Patient came back to [facility] ED 07/13/2014 with worsening shortness of breath and cough. On admission vital signs were stable. Blood work revealed hemoglobin of 9.9 and creatinine of 1.7 otherwise unremarkable. Chest x-ray showed similar right upper lobe opacities worrisome for possible underlying malignancy, superimposed infiltrates within the right upper and lower lobes stable but could be related to underlying malignancy or superimposed pneumonia. Patient was started on broad-spectrum antibiotics, vancomycin and cefepime for treatment of healthcare associated pneumonia. Palliative care was consulted for assistance with goals of care.  Assessment/Plan:   Principal Problem:   Acute on chronic respiratory failure with hypoxia, multifactorial with lung mass/suspected bronchogenic carcinoma, COPD exacerbation and HCAP contributory  Seen by palliative care team 07/13/14 with wishes to continue medical treatment, continue vancomycin/cefepime for HCAP.  Followup blood cultures. Narrow antibiotics depending on culture results if possible.  Continue nebulized bronchodilator treatment and empiric Solu-Medrol, wean as tolerated.  Continue supplemental oxygen.  No current plans to further evaluate lung mass given that he is not a candidate for treatment at this time. Likely will go home with hospice care.  Active Problems:   Anemia of chronic disease  No indication for transfusion. Hemoglobin stable.    Periorbital cellulitis of the left  eye  Cultures growing coagulase-negative staph.    Acute on chronic systolic and diastolic CHF / cardiomyopathy  Status post 2-D echo 05/17/14 with EF 29%, diastolic dysfunction, global hypokinesis and moderate aortic stenosis.  All antihypertensives including Lasix currently on hold secondary to hypotension.    Weakness generalized / failure to thrive  Secondary to multiple medical comorbidities, likely approaching end-of-life.  PT/OT evaluations performed with recommendations for 24-hour supervision.    Chronic kidney disease, stage IV  Creatinine improved over her usual baseline values.    Dyslipidemia  Continue Lipitor.    Severe protein calorie malnutrition / Underweight  Pt meets criteria for severe MALNUTRITION in the context of chronic illness as evidenced by <75% estimated energy intake with 9% weight loss in the past month with visible severe muscle wasting and subcutaneous fat loss throughout body.   Continue Ensure supplements and Megace for appetite stimulation.    DVT Prophylaxis  Continue subcutaneous heparin.  Code Status: DNR Family Communication: No family at the bedside.  Family updated by telephone. Disposition Plan: Home when stable.   IV Access:    Peripheral IV   Procedures and diagnostic studies:   Dg Chest 2 View 07/13/2014: 1. Grossly similar right upper lobe opacities, worrisome for possible underlying malignancy, better evaluated on prior CT from 06/19/2014. Superimposed infiltrates within the right upper and lower lobes also stable, with may be related to underlying malignancy or superimposed pneumonia. 2. Persistent right pleural effusion with associated right basilar atelectasis. 3. Emphysema.    Medical Consultants:    Wadie Lessen, NP, Palliative Care   Other Consultants:    PT  OT   Anti-Infectives:    Cefepime 07/13/14--->  Vancomycin 07/13/14--->  Subjective:   Edward Nunez is minimally responsive. Complains of  some mild shortness of breath and occasional cough. Appetite appears  to be okay. Denies pain.  Objective:    Filed Vitals:   07/14/14 2147 07/15/14 0532 07/15/14 0731 07/15/14 0752  BP:  122/86    Pulse:  93  78  Temp:  98.6 F (37 C)    TempSrc:  Oral    Resp:  20    Height:      Weight:      SpO2: 99% 93% 84% 92%    Intake/Output Summary (Last 24 hours) at 07/15/14 0355 Last data filed at 07/15/14 0600  Gross per 24 hour  Intake    720 ml  Output   1200 ml  Net   -480 ml    Exam: Gen:  NAD, cachectic appearing Cardiovascular:  RRR, No M/R/G Respiratory:  Lungs CTAB Gastrointestinal:  Abdomen soft, NT/ND, + BS Extremities:  No C/E/C   Data Reviewed:    Labs: Basic Metabolic Panel:  Recent Labs Lab 07/13/14 0300 07/15/14 0350  NA 142 140  K 4.0 5.3  CL 104 105  CO2  --  24  GLUCOSE 112* 128*  BUN 40* 36*  CREATININE 1.70* 1.37*  CALCIUM  --  9.4   GFR Estimated Creatinine Clearance: 33.5 ml/min (by C-G formula based on Cr of 1.37).  CBC:  Recent Labs Lab 07/13/14 0246 07/13/14 0300  WBC 7.2  --   NEUTROABS 5.6  --   HGB 9.9* 10.2*  HCT 28.8* 30.0*  MCV 78.5  --   PLT 265  --    BNP (last 3 results)  Recent Labs  05/22/14 0407 07/03/14 0530 07/13/14 0246  PROBNP 15127.0* 2758.0* 2977.0*   Microbiology Recent Results (from the past 240 hour(s))  EYE CULTURE     Status: None   Collection Time    07/06/14  6:58 PM      Result Value Ref Range Status   Specimen Description EYE   Final   Special Requests Normal   Final   Culture     Final   Value: FEW STAPHYLOCOCCUS SPECIES (COAGULASE NEGATIVE)     Performed at [facility]   Report Status 07/09/2014 FINAL   Final  CULTURE, BLOOD (ROUTINE X 2)     Status: None   Collection Time    07/13/14  3:32 AM      Result Value Ref Range Status   Specimen Description BLOOD RIGHT WRIST   Final   Special Requests BOTTLES DRAWN AEROBIC AND ANAEROBIC 1CC EACH   Final   Culture  Setup  Time     Final   Value: 07/13/2014 11:11     Performed at [facility]   Culture     Final   Value:        BLOOD CULTURE RECEIVED NO GROWTH TO DATE CULTURE WILL BE HELD FOR 5 DAYS BEFORE ISSUING A FINAL NEGATIVE REPORT     Note: Culture results may be compromised due to an inadequate volume of blood received in culture bottles.     Performed at [facility]   Report Status PENDING   Incomplete  CULTURE, BLOOD (ROUTINE X 2)     Status: None   Collection Time    07/13/14  3:32 AM      Result Value Ref Range Status   Specimen Description BLOOD LEFT ANTECUBITAL   Final   Special Requests BOTTLES DRAWN AEROBIC AND ANAEROBIC 5CC   Final   Culture  Setup Time     Final   Value: 07/13/2014 11:03  Performed at [facility]     Final   Value:        BLOOD CULTURE RECEIVED NO GROWTH TO DATE CULTURE WILL BE HELD FOR 5 DAYS BEFORE ISSUING A FINAL NEGATIVE REPORT     Performed at [facility]   Report Status PENDING   Incomplete  MRSA PCR SCREENING     Status: None   Collection Time    07/13/14  5:11 AM      Result Value Ref Range Status   MRSA by PCR NEGATIVE  NEGATIVE Final   Comment:            The GeneXpert MRSA Assay (FDA     approved for NASAL specimens     only), is one component of a     comprehensive MRSA colonization     surveillance program. It is not     intended to diagnose MRSA     infection nor to guide or     monitor treatment for     MRSA infections.     Medications:   . antiseptic oral rinse  7 mL Mouth Rinse BID  . aspirin EC  81 mg Oral Daily  . atorvastatin  20 mg Oral QHS  . ceFEPime (MAXIPIME) IV  1 g Intravenous Q24H  . feeding supplement (ENSURE COMPLETE)  237 mL Oral BID BM  . heparin  5,000 Units Subcutaneous 3 times per day  . ipratropium-albuterol  3 mL Nebulization QID  . megestrol  400 mg Oral BID  . methylPREDNISolone (SOLU-MEDROL) injection  40 mg Intravenous Q24H  . vancomycin  500 mg Intravenous Q24H    Continuous Infusions:   Time spent: 25 minutes.   LOS: 2 days   RAMA,CHRISTINA  [facility] Pager [phone]. If unable to reach me by pager, please call my cell phone at [phone].  *Please refer to [email or web address], password [email or web address] to get updated schedule on who will round on this patient, as hospitalists switch teams weekly. If 7PM-7AM, please contact night-coverage at [email or web address], password [email or web address] for any overnight needs.  07/15/2014, 8:12 AM

## 2014-07-15 NOTE — Progress Notes (Signed)
Progress Note from the Palliative Medicine Team at [facility]:  -meet with family at bedside for continued conversation regarding [place] and anticipatory care needs  -patient participated and was able to verbalize his desire to dc home with hospice services, MOST form completed  Objective: No Known Allergies Scheduled Meds: . antiseptic oral rinse  7 mL Mouth Rinse BID  . aspirin EC  81 mg Oral Daily  . atorvastatin  20 mg Oral QHS  . ceFEPime (MAXIPIME) IV  1 g Intravenous Q24H  . feeding supplement (ENSURE COMPLETE)  237 mL Oral BID BM  . heparin  5,000 Units Subcutaneous 3 times per day  . ipratropium-albuterol  3 mL Nebulization QID  . megestrol  400 mg Oral BID  . methylPREDNISolone (SOLU-MEDROL) injection  40 mg Intravenous Q24H  . vancomycin  500 mg Intravenous Q24H   Continuous Infusions:  PRN Meds:.ipratropium-albuterol, LORazepam  BP 122/86  Pulse 78  Temp(Src) 98.6 F (37 C) (Oral)  Resp 20  Ht 6' (1.829 m)  Wt 60.102 kg (132 lb 8 oz)  BMI 17.97 kg/m2  SpO2 92%   PPS:40 % at best  Pain Score:debnies presently    Intake/Output Summary (Last 24 hours) at 07/15/14 1206 Last data filed at 07/15/14 0600  Gross per 24 hour  Intake    480 ml  Output   1200 ml  Net   -720 ml        Physical Exam:  General: OOB to chair, alert and oriented and engaged in conversation today HEENT:  Moist buccal membranes, no exudate noted Chest:   Decreased in bases CVS: RRR Abdomen:soft NT +BS Ext: without edema Skin: warm and dry Neuro: moves all four extremities, oriented X3  Labs: CBC    Component Value Date/Time   WBC 7.2 07/13/2014 0246   WBC 7.6 06/01/2014 1827   RBC 3.67* 07/13/2014 0246   RBC 4.28* 06/01/2014 1827   HGB 10.2* 07/13/2014 0300   HGB 11.4* 06/01/2014 1827   HCT 30.0* 07/13/2014 0300   HCT 36.8* 06/01/2014 1827   PLT 265 07/13/2014 0246   MCV 78.5 07/13/2014 0246   MCV 86.0 06/01/2014 1827   MCH 27.0 07/13/2014 0246   MCH 26.7* 06/01/2014  1827   MCHC 34.4 07/13/2014 0246   MCHC 31.0* 06/01/2014 1827   RDW 15.3 07/13/2014 0246   LYMPHSABS 0.9 07/13/2014 0246   MONOABS 0.6 07/13/2014 0246   EOSABS 0.1 07/13/2014 0246   BASOSABS 0.0 07/13/2014 0246    BMET    Component Value Date/Time   NA 140 07/15/2014 0350   K 5.3 07/15/2014 0350   CL 105 07/15/2014 0350   CO2 24 07/15/2014 0350   GLUCOSE 128* 07/15/2014 0350   BUN 36* 07/15/2014 0350   CREATININE 1.37* 07/15/2014 0350   CREATININE 1.88* 06/20/2014 1325   CALCIUM 9.4 07/15/2014 0350   GFRNONAA 45* 07/15/2014 0350   GFRNONAA 55* 12/06/2013 0931   GFRAA 53* 07/15/2014 0350   GFRAA 63 12/06/2013 0931    CMP     Component Value Date/Time   NA 140 07/15/2014 0350   K 5.3 07/15/2014 0350   CL 105 07/15/2014 0350   CO2 24 07/15/2014 0350   GLUCOSE 128* 07/15/2014 0350   BUN 36* 07/15/2014 0350   CREATININE 1.37* 07/15/2014 0350   CREATININE 1.88* 06/20/2014 1325   CALCIUM 9.4 07/15/2014 0350   PROT 7.0 06/01/2014 1824   ALBUMIN 3.2* 06/01/2014 1824   AST 16 06/01/2014 1824  ALT 13 06/01/2014 1824   ALKPHOS 80 06/01/2014 1824   BILITOT 0.4 06/01/2014 1824   GFRNONAA 45* 07/15/2014 0350   GFRNONAA 55* 12/06/2013 0931   GFRAA 53* 07/15/2014 0350   GFRAA 63 12/06/2013 0931     1   Assessment and Plan: 1. Code Status: DNR/DNI-comfort is main focus of care  Convert IV antibiotics to oral in anticipation home   2. Symptom Control: 1. Anxiety/Agitation: Ativan 1 mg po every 6 hrs prn 2. Pain/Dyspnea: Roxanol 5 mg po/sl every 2 hrs prn 2 3. Psycho/Social:  Emotional support offered to patient and his family.  All understand his decline and limited prognosis and all support a comfort care path.   Family has put together a care plan to assist both the patient and his wife at home   4. Spiritual:  Chaplain services consulted   5. Disposition:  Home with hospcie services, will write for choice  Patient Documents Completed or Given: Document Given Completed  Advanced Directives Pkt     MOST  X  DNR    Gone from My Sight    Hard Choices  X    Time In Time Out Total Time Spent with Patient Total Overall Time  1500 1600 60 min 60 min    Greater than 50%  of this time was spent counseling and coordinating care related to the above assessment and plan.  Wadie Lessen NP  Palliative Medicine Team Team Phone # [phone] Pager [phone]  Discussed with SW 1

## 2014-07-15 NOTE — Telephone Encounter (Signed)
Phone call to [facility]. Patient is discharging from [facility] tomorrow and will be receiving hospice care. Vickie would like to know if you will be the attending physician? She also states the hospice doctors can assist you refills etc please advise.

## 2014-07-15 NOTE — Progress Notes (Signed)
Notified by [place] pt/family request services of [facility] at discharge. Patient information reviewed with Edward Alferd Patee, [place] Director and hospice eligibility confirmed.  Spoke with pt and dtr Edward Nunez at bedside with dtrs Edward Nunez and Edward Nunez conferenced in via speaker phone; - pt on this visit was alert, oriented agreed that discussions with him and any of his daughters were fine; dtr Edward Nunez, shared she is here visiting from [place] through next week to help get pt settled at home; pt's dtr Edward Nunez will be arriving Saturday from [place] and will be physically caring for pt once he is back at home. Edward Nunez (pt's step daughter) lives in the home but works full-time and her mother/pt's wife has memory issues. Writer initiated education related to hospice services, philosophy and team approach to care, daughters and pt voiced good understanding of information provided.   On this visit writer noted pt with RR=26 increased work of breathing/dyspnea; pt with wet sounding cough; Staff RN called for breathing treatment and PMT NP notified and PRN liquid Concentrated Morphine ordered and given by staff RN Caitlyn - pt reported feeling some relief with dyspnea at end of visit. Edward Rama called to speak with pt's daughters during writer's visit; per discussion plan is to continue current treatment plan and look at possible discharge at the end of the week.  *Pt will need current prescriptions for all discharge medications as pt was not at home prior to this admission was at SNF *Please send completed GOLD DNR Form home with pt; plan is for non-emergent transport to home when medically ready for discharge.  DME needs discussed with family; information sent to [facility] equipment manager to place order for 1)Complete Pkg A: fully electric hospital bed w/ AP&P mattress, half upper&lower rails; over-bed table, walker with wheels; 3n1 BSC;  light weight wheelchair; and tub seat w/back; and  2) Oxygen Pkg B: pt currently on O2 @2LNC  continuous and Nebulizer Tx QID & PRN *Please contact dtr Edward Nunez @ [phone] to arrange delivery time for Thursday afternoon -family has requested pt's wife not be contacted as she has some memory issues   Attending physician working with [facility] once home is PCP Edward Nunez; pharmacy: [facility] Initial information sent to [facility]  *Completed d/c summary will need to be faxed to [facility] @ [phone] when final Please notify [facility] when patient is ready to leave unit at d/c call [phone] (or [phone] if after 5 pm);  [facility] information and contact numbers also given to daughter Edward Nunez during visit.   Above information shared with staff RN and Probation officer will notify [facility] Please call with any questions or concerns   Danton Sewer, RN 07/15/2014, 3:37 PM [facility] [place] RN Liaison [phone]

## 2014-07-15 NOTE — Progress Notes (Addendum)
ANTIBIOTIC CONSULT NOTE - FOLLOW UP  Pharmacy Consult for vancomycin and cefepime Indication: pneumonia  No Known Allergies  Patient Measurements: Height: 6' (182.9 cm) Weight: 132 lb 8 oz (60.102 kg) IBW/kg (Calculated) : 77.6  Estimated Creatinine Clearance: 33.5 ml/min (by C-G formula based on Cr of 1.37).   Assessment: 80 yo male discharged 9/29 after treatment for HCAP (received cefepime and vanc x 3 days discharged on levofloxacin) complicated by presence of known lung mass re-admitted 10/5 with worsening SOB. Pharmacy consulted to dose vancomycin and cefepime for HCAP  Antiinfectives  10/5 >> vanc >> 10/5 >> cefepime >>  Labs / vitals Tmax: afebrile WBCs: 7.2 on 10/4 Renal: SCr improved to 1.37, CrCl 33 CG, 40 N  Microbiology 10/5 blood: ngtd 10/5 sputum: ordered 10/5 MRSA PCR: negative  Dose changes/drug level info:  10/7 empiric increase of cefepime to 1g q12h and vanc to 750mg  q24h for improved renal function   Goal of Therapy:  Vancomycin trough level 15-20 mcg/ml cefepime per indication and renal function  Plan:  - increase vancomycin to 750mg  IV q24h on 10/8 at 0800 - increase cefepime to 1g q12h to start today at 2200 - vancomycin trough at steady state if indicated - follow-up clinical course, culture results, renal function - follow-up antibiotic de-escalation and length of therapy  Thank you for the consult.  Currie Paris, PharmD, BCPS Pager: [phone] Pharmacy: [phone] 07/15/2014 1:49 PM

## 2014-07-15 NOTE — Telephone Encounter (Signed)
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  Needs to speak with Nurse. She was requesting a return call.

## 2014-07-15 NOTE — Telephone Encounter (Signed)
Spoke with Vickie and Dr. [name].  Dr. [name] is the PCP and will serve as attending physician for this patient, along with the Hospice team.

## 2014-07-15 NOTE — Telephone Encounter (Signed)
His PCP is Dr Shanon Brow Linna Darner

## 2014-07-16 LAB — BASIC METABOLIC PANEL
Anion gap: 11 (ref 5–15)
BUN: 35 mg/dL — ABNORMAL HIGH (ref 6–23)
CHLORIDE: 102 meq/L (ref 96–112)
CO2: 24 meq/L (ref 19–32)
CREATININE: 1.25 mg/dL (ref 0.50–1.35)
Calcium: 9.5 mg/dL (ref 8.4–10.5)
GFR calc Af Amer: 59 mL/min — ABNORMAL LOW (ref >90)
GFR calc non Af Amer: 51 mL/min — ABNORMAL LOW (ref >90)
GLUCOSE: 119 mg/dL — AB (ref 70–99)
Potassium: 5.4 mEq/L — ABNORMAL HIGH (ref 3.7–5.3)
Sodium: 137 mEq/L (ref 137–147)

## 2014-07-16 NOTE — Telephone Encounter (Signed)
I already agreed to be pcp to sign orders for hospice.  Others can do for me in my absence.

## 2014-07-16 NOTE — Progress Notes (Signed)
OT Cancellation Note  Patient Details Name: Edward Nunez MRN: [id number] DOB: 03-04-1926   Cancelled Treatment:    Reason Eval/Treat Not Completed: Other (comment).  PT just preceded me and checked with pt.  He does not want to get OOB at this time.  Will check another day.  Ginevra Tacker 07/16/2014, 4:13 PM Lesle Chris, OTR/L [phone] 07/16/2014

## 2014-07-16 NOTE — Progress Notes (Signed)
Progress Note   Edward Nunez UXN:[id number] DOB: 1926/06/04 DOA: 07/13/2014 PCP: [name], MD   Brief Narrative:   Wolfe Camarena is an 80 y.o. male with recent diagnosis of right upper lobe lung mass suspected to be bronchogenic carcinoma, history of chronic kidney disease stage IV, severe protein calorie malnutrition, hypertension, COPD/chronic respiratory failure on 2 L oxygen at home who was recently hospitalized for healthcare associated pneumonia. Patient came back to [facility] ED 07/13/2014 with worsening shortness of breath and cough. On admission vital signs were stable. Blood work revealed hemoglobin of 9.9 and creatinine of 1.7 otherwise unremarkable. Chest x-ray showed similar right upper lobe opacities worrisome for possible underlying malignancy, superimposed infiltrates within the right upper and lower lobes stable but could be related to underlying malignancy or superimposed pneumonia. Patient was started on broad-spectrum antibiotics, vancomycin and cefepime for treatment of healthcare associated pneumonia. Palliative care was consulted for assistance with goals of care.  Assessment/Plan:   Principal Problem:   Acute on chronic respiratory failure with hypoxia, multifactorial with lung mass/suspected bronchogenic carcinoma, COPD exacerbation and HCAP contributory  Seen by palliative care team 07/13/14 with wishes to continue medical treatment, continue vancomycin/cefepime for HCAP.  Blood cultures negative to date. Narrow antibiotics depending on culture results if possible.  Continue nebulized bronchodilator treatment and empiric Solu-Medrol, wean as tolerated.  Continue supplemental oxygen.  No current plans to further evaluate lung mass given that he is not a candidate for treatment at this time. Likely will go home with hospice care.  Active Problems:   Anemia of chronic disease  No indication for transfusion. Hemoglobin stable.    Periorbital cellulitis of the  left eye  Cultures growing coagulase-negative staph.    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  All antihypertensives including Lasix currently on hold secondary to hypotension.    Weakness generalized / failure to thrive  Secondary to multiple medical comorbidities, likely approaching end-of-life.  PT/OT evaluations performed with recommendations for 24-hour supervision.    Chronic kidney disease, stage IV  Creatinine improved over usual baseline values.    Dyslipidemia  Continue Lipitor.    Severe protein calorie malnutrition / Underweight  Pt meets criteria for severe MALNUTRITION in the context of chronic illness as evidenced by <75% estimated energy intake with 9% weight loss in the past month with visible severe muscle wasting and subcutaneous fat loss throughout body.   Continue Ensure supplements and Megace for appetite stimulation.    DVT Prophylaxis  Continue subcutaneous heparin.  Code Status: DNR Family Communication: No family at the bedside.  Daughter updated at the bedside Edward Nunez). Disposition Plan: Home when stable.   IV Access:    Peripheral IV   Procedures and diagnostic studies:   Dg Chest 2 View 07/13/2014: 1. Grossly similar right upper lobe opacities, worrisome for possible underlying malignancy, better evaluated on prior CT from 06/19/2014. Superimposed infiltrates within the right upper and lower lobes also stable, with may be related to underlying malignancy or superimposed pneumonia. 2. Persistent right pleural effusion with associated right basilar atelectasis. 3. Emphysema.    Medical Consultants:    Edward Lessen, NP, Palliative Care   Other Consultants:    PT  OT   Anti-Infectives:    Cefepime 07/13/14--->  Vancomycin 07/13/14--->  Subjective:   Edward Nunez is more responsive today. He  says he is less dyspneic but still  has an occasional cough. His bowels moved yesterday. Appetite is fair.  Objective:    Filed Vitals:   07/15/14 1936 07/15/14 2026 07/16/14 0628 07/16/14 0734  BP: 149/73  132/84   Pulse: 75  73   Temp: 97.8 F (36.6 C)  97.8 F (36.6 C)   TempSrc: Oral  Oral   Resp: 16  20   Height:      Weight:      SpO2: 99% 97% 97% 90%    Intake/Output Summary (Last 24 hours) at 07/16/14 0750 Last data filed at 07/15/14 1400  Gross per 24 hour  Intake    600 ml  Output    300 ml  Net    300 ml    Exam: Gen:  NAD, cachectic appearing Cardiovascular:  RRR, No M/R/G Respiratory:  Lungs with crackles at the left base Gastrointestinal:  Abdomen soft, NT/ND, + BS Extremities:  No C/E/C   Data Reviewed:    Labs: Basic Metabolic Panel:  Recent Labs Lab 07/13/14 0300 07/15/14 0350 07/16/14 0401  NA 142 140 137  K 4.0 5.3 5.4*  CL 104 105 102  CO2  --  24 24  GLUCOSE 112* 128* 119*  BUN 40* 36* 35*  CREATININE 1.70* 1.37* 1.25  CALCIUM  --  9.4 9.5   GFR Estimated Creatinine Clearance: 36.7 ml/min (by C-G formula based on Cr of 1.25).  CBC:  Recent Labs Lab 07/13/14 0246 07/13/14 0300  WBC 7.2  --   NEUTROABS 5.6  --   HGB 9.9* 10.2*  HCT 28.8* 30.0*  MCV 78.5  --   PLT 265  --    BNP (last 3 results)  Recent Labs  05/22/14 0407 07/03/14 0530 07/13/14 0246  PROBNP 15127.0* 2758.0* 2977.0*   Microbiology Recent Results (from the past 240 hour(s))  EYE CULTURE     Status: None   Collection Time    07/06/14  6:58 PM      Result Value Ref Range Status   Specimen Description EYE   Final   Special Requests Normal   Final   Culture     Final   Value: FEW STAPHYLOCOCCUS SPECIES (COAGULASE NEGATIVE)     Performed at [facility]   Report Status 07/09/2014 FINAL   Final  CULTURE, BLOOD (ROUTINE X 2)     Status: None   Collection Time    07/13/14  3:32 AM      Result Value Ref Range Status   Specimen Description  BLOOD RIGHT WRIST   Final   Special Requests BOTTLES DRAWN AEROBIC AND ANAEROBIC 1CC EACH   Final   Culture  Setup Time     Final   Value: 07/13/2014 11:11     Performed at [facility]   Culture     Final   Value:        BLOOD CULTURE RECEIVED NO GROWTH TO DATE CULTURE WILL BE HELD FOR 5 DAYS BEFORE ISSUING A FINAL NEGATIVE REPORT     Note: Culture results may be compromised due to an inadequate volume of blood received in culture bottles.     Performed at [facility]   Report Status PENDING   Incomplete  CULTURE, BLOOD (ROUTINE X 2)     Status: None   Collection Time    07/13/14  3:32 AM      Result Value Ref Range Status   Specimen Description BLOOD LEFT ANTECUBITAL   Final   Special Requests BOTTLES  DRAWN AEROBIC AND ANAEROBIC 5CC   Final   Culture  Setup Time     Final   Value: 07/13/2014 11:03     Performed at [facility]   Culture     Final   Value:        BLOOD CULTURE RECEIVED NO GROWTH TO DATE CULTURE WILL BE HELD FOR 5 DAYS BEFORE ISSUING A FINAL NEGATIVE REPORT     Performed at [facility]   Report Status PENDING   Incomplete  MRSA PCR SCREENING     Status: None   Collection Time    07/13/14  5:11 AM      Result Value Ref Range Status   MRSA by PCR NEGATIVE  NEGATIVE Final   Comment:            The GeneXpert MRSA Assay (FDA     approved for NASAL specimens     only), is one component of a     comprehensive MRSA colonization     surveillance program. It is not     intended to diagnose MRSA     infection nor to guide or     monitor treatment for     MRSA infections.     Medications:   . antiseptic oral rinse  7 mL Mouth Rinse BID  . aspirin EC  81 mg Oral Daily  . atorvastatin  20 mg Oral QHS  . ceFEPime (MAXIPIME) IV  1 g Intravenous Q12H  . feeding supplement (ENSURE COMPLETE)  237 mL Oral BID BM  . heparin  5,000 Units Subcutaneous 3 times per day  . ipratropium-albuterol  3 mL Nebulization QID  . megestrol  400 mg Oral  BID  . methylPREDNISolone (SOLU-MEDROL) injection  40 mg Intravenous Q24H  . vancomycin  750 mg Intravenous Q24H   Continuous Infusions:   Time spent: 25 minutes.   LOS: 3 days   [facility] Pager [phone]. If unable to reach me by pager, please call my cell phone at [phone].  *Please refer to [email or web address], password [email or web address] to get updated schedule on who will round on this patient, as hospitalists switch teams weekly. If 7PM-7AM, please contact night-coverage at [email or web address], password [email or web address] for any overnight needs.  07/16/2014, 7:50 AM

## 2014-07-16 NOTE — Progress Notes (Signed)
PT Cancellation Note  Patient Details Name: Edward Nunez MRN: [id number] DOB: 1926-06-27   Cancelled Treatment:    Reason Eval/Treat Not Completed: Other (comment) (patient  leaning to the Right against bedrail. repositioned, pt  did not want to sit up when asked.)   Marcelino Freestone PT [phone]  07/16/2014, 4:35 PM

## 2014-07-17 MED ORDER — LORAZEPAM 1 MG PO TABS: 1.0000 mg | ORAL_TABLET | Freq: Four times a day (QID) | ORAL | Status: AC | PRN

## 2014-07-17 MED ORDER — FUROSEMIDE 20 MG PO TABS: 20.0000 mg | ORAL_TABLET | Freq: Every day | ORAL | Status: AC

## 2014-07-17 MED ORDER — MEGESTROL ACETATE 400 MG/10ML PO SUSP: 400.0000 mg | Freq: Two times a day (BID) | ORAL | Status: AC

## 2014-07-17 MED ORDER — AMOXICILLIN-POT CLAVULANATE 500-125 MG PO TABS
1.0000 | ORAL_TABLET | Freq: Two times a day (BID) | ORAL | Status: DC
  Administered 2014-07-17: 500 mg via ORAL
  Filled 2014-07-17 (×3): qty 1

## 2014-07-17 MED ORDER — MORPHINE SULFATE (CONCENTRATE) 10 MG /0.5 ML PO SOLN: 5.0000 mg | ORAL | Status: AC | PRN

## 2014-07-17 MED ORDER — IPRATROPIUM-ALBUTEROL 0.5-2.5 (3) MG/3ML IN SOLN: 3.0000 mL | RESPIRATORY_TRACT | Status: AC | PRN

## 2014-07-17 MED ORDER — BISOPROLOL FUMARATE 5 MG PO TABS: 5.0000 mg | ORAL_TABLET | Freq: Every day | ORAL | Status: AC

## 2014-07-17 MED ORDER — ISOSORB DINITRATE-HYDRALAZINE 20-37.5 MG PO TABS: 1.0000 | ORAL_TABLET | Freq: Three times a day (TID) | ORAL | Status: AC

## 2014-07-17 MED ORDER — IPRATROPIUM-ALBUTEROL 18-103 MCG/ACT IN AERO: 1.0000 | INHALATION_SPRAY | RESPIRATORY_TRACT | Status: AC

## 2014-07-17 MED ORDER — AMOXICILLIN-POT CLAVULANATE 500-125 MG PO TABS: 1.0000 | ORAL_TABLET | Freq: Two times a day (BID) | ORAL | Status: AC

## 2014-07-17 MED ORDER — PREDNISONE 20 MG PO TABS
40.0000 mg | ORAL_TABLET | Freq: Once | ORAL | Status: AC
  Administered 2014-07-17: 40 mg via ORAL
  Filled 2014-07-17 (×2): qty 2

## 2014-07-17 MED ORDER — ATORVASTATIN CALCIUM 20 MG PO TABS: 20.0000 mg | ORAL_TABLET | Freq: Every day | ORAL | Status: AC

## 2014-07-17 NOTE — Telephone Encounter (Signed)
Rachel Moulds from [facility] called. We referred pt to them and they would like OV notes faxed from [place] and Sept visits to [phone]. Done.

## 2014-07-17 NOTE — Discharge Instructions (Signed)
Failure to Thrive, Adult  Adult failure to thrive is a condition that some older people can get over time. These people are not able to do things they once could. They may not be interested in the things they once liked. This is not a normal part of aging. Treatment is directed at the cause. Sometimes, treatment is not possible if a cause cannot be found. [place] care depends on the cause of the condition. Basic home care includes:  Taking all medicines as told by the doctor.  Eating healthy foods.  Asking the doctor about taking vitamins, herbs, or nutrition drinks.  Exercising.  Making sure the home is safe.  Understanding what to do when the person can no longer make decisions on his or her own. GET HELP RIGHT AWAY IF:  The person has thoughts about ending his or her life.  The person cannot eat or drink.  The person does not get out of bed.  Staying at home is not safe.  The person has a fever.  There are questions about medicines.  There are questions about treatment effects.  The person is not able to eat well.  The person is not able to move around.  The person feels very sad or hopeless. MAKE SURE YOU:  Understand these instructions.  Will watch the person's condition.  Will get help right away if the person is not doing well or gets worse. Document Released: 09/14/2011 Document Revised: 12/18/2011 Document Reviewed: 09/14/2011 [facility] Patient Information 2015 [place], [place]. This information is not intended to replace advice given to you by your health care provider. Make sure you discuss any questions you have with your health care provider.  Pneumonia Pneumonia is an infection of the lungs.  CAUSES Pneumonia may be caused by bacteria or a virus. Usually, these infections are caused by breathing infectious particles into the lungs (respiratory tract). SIGNS AND SYMPTOMS   Cough.  Fever.  Chest pain.  Increased rate of  breathing.  Wheezing.  Mucus production. DIAGNOSIS  If you have the common symptoms of pneumonia, your health care provider will typically confirm the diagnosis with a chest X-ray. The X-ray will show an abnormality in the lung (pulmonary infiltrate) if you have pneumonia. Other tests of your blood, urine, or sputum may be done to find the specific cause of your pneumonia. Your health care provider may also do tests (blood gases or pulse oximetry) to see how well your lungs are working. TREATMENT  Some forms of pneumonia may be spread to other people when you cough or sneeze. You may be asked to wear a mask before and during your exam. Pneumonia that is caused by bacteria is treated with antibiotic medicine. Pneumonia that is caused by the influenza virus may be treated with an antiviral medicine. Most other viral infections must run their course. These infections will not respond to antibiotics.  HOME CARE INSTRUCTIONS   Cough suppressants may be used if you are losing too much rest. However, coughing protects you by clearing your lungs. You should avoid using cough suppressants if you can.  Your health care provider may have prescribed medicine if he or she thinks your pneumonia is caused by bacteria or influenza. Finish your medicine even if you start to feel better.  Your health care provider may also prescribe an expectorant. This loosens the mucus to be coughed up.  Take medicines only as directed by your health care provider.  Do not smoke. Smoking is a common cause  of bronchitis and can contribute to pneumonia. If you are a smoker and continue to smoke, your cough may last several weeks after your pneumonia has cleared.  A cold steam vaporizer or humidifier in your room or home may help loosen mucus.  Coughing is often worse at night. Sleeping in a semi-upright position in a recliner or using a couple pillows under your head will help with this.  Get rest as you feel it is needed.  Your body will usually let you know when you need to rest. PREVENTION A pneumococcal shot (vaccine) is available to prevent a common bacterial cause of pneumonia. This is usually suggested for:  People over 5 years old.  Patients on chemotherapy.  People with chronic lung problems, such as bronchitis or emphysema.  People with immune system problems. If you are over 65 or have a high risk condition, you may receive the pneumococcal vaccine if you have not received it before. In some countries, a routine influenza vaccine is also recommended. This vaccine can help prevent some cases of pneumonia.You may be offered the influenza vaccine as part of your care. If you smoke, it is time to quit. You may receive instructions on how to stop smoking. Your health care provider can provide medicines and counseling to help you quit. SEEK MEDICAL CARE IF: You have a fever. SEEK IMMEDIATE MEDICAL CARE IF:   Your illness becomes worse. This is especially true if you are elderly or weakened from any other disease.  You cannot control your cough with suppressants and are losing sleep.  You begin coughing up blood.  You develop pain which is getting worse or is uncontrolled with medicines.  Any of the symptoms which initially brought you in for treatment are getting worse rather than better.  You develop shortness of breath or chest pain. MAKE SURE YOU:   Understand these instructions.  Will watch your condition.  Will get help right away if you are not doing well or get worse. Document Released: 09/25/2005 Document Revised: 02/09/2014 Document Reviewed: 12/15/2010 [facility] Patient Information 2015 [place], [place]. This information is not intended to replace advice given to you by your health care provider. Make sure you discuss any questions you have with your health care provider.

## 2014-07-17 NOTE — Progress Notes (Signed)
Pt for discharge [facility]. Per [facility] RN liaison, pt requires ambulance transport home.  CSW met with pt and pt daughter at bedside. CSW confirmed address for ambulance transport home.   CSW arranged ambulance transport for 11:30 am for pt to home via [facility]. (ambulance auth#: [id number]). Pt and pt daughter and RN aware.  No further social work needs identified at this time.  CSW signing off.   Alison Murray, MSW, [place] Work [phone]

## 2014-07-17 NOTE — Progress Notes (Signed)
ANTIBIOTIC CONSULT NOTE - FOLLOW UP  Pharmacy Consult for renal adjustment of antibiotics Indication: pneumonia/HCAP  No Known Allergies  Patient Measurements: Height: 6' (182.9 cm) Weight: 132 lb 8 oz (60.102 kg) IBW/kg (Calculated) : 77.6  Estimated Creatinine Clearance: 36.7 ml/min (by C-G formula based on Cr of 1.25).   Assessment: 80 yo male discharged 9/29 after treatment for HCAP (received cefepime and vanc x 3 days discharged on levofloxacin) complicated by presence of known lung mass re-admitted 10/5 with worsening SOB. Pharmacy consulted to dose vancomycin and cefepime for HCAP  Antiinfectives  10/5 >> vanc >> 10/9 10/5 >> cefepime >> 10/9 10/9 >> Augmentin  >>  Labs / vitals Tmax: afebrile WBCs: 7.2 on 10/5 Renal: SCr improving, 1.25, CrCl 37 CG, 43 N  Microbiology 10/5 blood: ngtd 10/5 sputum: ordered 10/5 MRSA PCR: negative  Dose changes/drug level info:  10/7 empiric increase of cefepime to 1g q12h and vanc to 750mg  q24h for improved renal function  Goal of Therapy:  Appropriate antibiotic dosing for renal function; eradication of infection  Plan:   Descalating to PO abx in preparation for discharge. Using Augmentin to cover for possible HCAP pathogens. Augmentin 500mg  PO q12h. Opted for more conservative regimen d/t advanced age and impaired renal function.   Follow-up clinical course, culture results, renal function.  Romeo Rabon, PharmD, pager [phone]. 07/17/2014,9:31 AM.

## 2014-07-17 NOTE — Discharge Summary (Signed)
Physician Discharge Summary  Edward Nunez ZJI:[id number] DOB: 1926/07/09 DOA: 07/13/2014  PCP: [name], MD  Admit date: 07/13/2014 Discharge date: 07/17/2014   Recommendations for Outpatient Follow-Up:   1. The patient is being discharged home with hospice care. 2. Followup final blood culture report.   Discharge Diagnosis:   Principal Problem:   Acute respiratory failure with hypoxia secondary to HAP/COPD exacerbation and lung mass Active Problems:    Hyperlipidemia    Systolic and diastolic CHF, acute on chronic    CKD (chronic kidney disease) stage 4, GFR 15-29 ml/min    Protein-calorie malnutrition, severe    Lung mass    HCAP (healthcare-associated pneumonia)    COPD exacerbation    Cardiomyopathy    Chronic hypoxemic respiratory failure    FTT (failure to thrive) in adult    Periorbital cellulitis of left eye    DNR (do not resuscitate)    Weakness generalized    Underweight    Anemia of chronic disease    Dyspnea   Discharge Condition: Improved.  Diet recommendation: Low sodium, heart healthy.     History of Present Illness:   Edward Nunez is an 80 y.o. male with recent diagnosis of right upper lobe lung mass suspected to be bronchogenic carcinoma, history of chronic kidney disease stage IV, severe protein calorie malnutrition, hypertension, COPD/chronic respiratory failure on 2 L oxygen at home who was recently hospitalized for healthcare associated pneumonia. Patient came [facility] ED 07/13/2014 with worsening shortness of breath and cough. On admission vital signs were stable. Blood work revealed hemoglobin of 9.9 and creatinine of 1.7 otherwise unremarkable. Chest x-ray showed similar right upper lobe opacities worrisome for possible underlying malignancy, superimposed infiltrates within the right upper and lower lobes stable but could be related to underlying malignancy or superimposed pneumonia. Patient was started on broad-spectrum  antibiotics, vancomycin and cefepime for treatment of healthcare associated pneumonia. Palliative care was consulted for assistance with goals of care.  Hospital Course by Problem:   Principal Problem:  Acute on chronic respiratory failure with hypoxia, multifactorial with lung mass/suspected bronchogenic carcinoma, COPD exacerbation and HCAP contributory  Seen by palliative care team 07/13/14 with wishes to continue medical treatment, status post 4 days of treatment with vancomycin/cefepime for HCAP. Discharge home on an additional 7 days of therapy with Augmentin. Status post treatment Solu-Medrol. No further steroids indicated, no active bronchospasm. Blood cultures negative to date. Continue nebulized bronchodilator treatment.  Continue supplemental oxygen.  No current plans to further evaluate lung mass given that he is not a candidate for treatment at this time. Likely will go home with hospice care.  Active Problems:  Anemia of chronic disease  No indication for transfusion. Hemoglobin stable.  Periorbital cellulitis of the left eye  Cultures grew coagulase-negative staph, likely normal colonization. Status post treatment with Cipro eyedrops.  Acute on chronic systolic and diastolic CHF / cardiomyopathy  Status post 2-D echo 05/17/14 with EF 17%, diastolic dysfunction, global hypokinesis and moderate aortic stenosis.  Resume usual maintenance medications at discharge.  Weakness generalized / failure to thrive  Secondary to multiple medical comorbidities, likely approaching end-of-life.  PT/OT evaluations performed with recommendations for 24-hour supervision.  Chronic kidney disease, stage IV  Creatinine improved over usual baseline values.  Dyslipidemia  Continue Lipitor.  Severe protein calorie malnutrition / Underweight  Pt met criteria for severe MALNUTRITION in the context of chronic illness as evidenced by <75% estimated energy intake with 9% weight loss in the past month  with visible severe muscle wasting and subcutaneous fat loss throughout body.  Continue Ensure supplements and Megace for appetite stimulation.   Medical Consultants:    None.   Discharge Exam:   Filed Vitals:   07/17/14 0513  BP: 134/76  Pulse: 75  Temp: 98.6 F (37 C)  Resp: 18   Filed Vitals:   07/16/14 1939 07/16/14 2129 07/17/14 0513 07/17/14 0741  BP:  142/77 134/76   Pulse:  87 75   Temp:  98.8 F (37.1 C) 98.6 F (37 C)   TempSrc:  Oral Oral   Resp:  16 18   Height:      Weight:      SpO2: 96% 97% 98% 95%    Gen:  NAD Cardiovascular:  RRR, No M/R/G Respiratory: Lungs CTAB Gastrointestinal: Abdomen soft, NT/ND with normal active bowel sounds. Extremities: No C/E/C   The results of significant diagnostics from this hospitalization (including imaging, microbiology, ancillary and laboratory) are listed below for reference.     Procedures and Diagnostic Studies:   Dg Chest 2 View 07/13/2014: 1. Grossly similar right upper lobe opacities, worrisome for possible underlying malignancy, better evaluated on prior CT from 06/19/2014. Superimposed infiltrates within the right upper and lower lobes also stable, with may be related to underlying malignancy or superimposed pneumonia. 2. Persistent right pleural effusion with associated right basilar atelectasis. 3. Emphysema.    Labs:   Basic Metabolic Panel:  Recent Labs Lab 07/13/14 0300 07/15/14 0350 07/16/14 0401  NA 142 140 137  K 4.0 5.3 5.4*  CL 104 105 102  CO2  --  24 24  GLUCOSE 112* 128* 119*  BUN 40* 36* 35*  CREATININE 1.70* 1.37* 1.25  CALCIUM  --  9.4 9.5   GFR Estimated Creatinine Clearance: 36.7 ml/min (by C-G formula based on Cr of 1.25).  CBC:  Recent Labs Lab 07/13/14 0246 07/13/14 0300  WBC 7.2  --   NEUTROABS 5.6  --   HGB 9.9* 10.2*  HCT 28.8* 30.0*  MCV 78.5  --   PLT 265  --    Microbiology Recent Results (from the past 240 hour(s))  CULTURE, BLOOD (ROUTINE X 2)      Status: None   Collection Time    07/13/14  3:32 AM      Result Value Ref Range Status   Specimen Description BLOOD RIGHT WRIST   Final   Special Requests BOTTLES DRAWN AEROBIC AND ANAEROBIC 1CC EACH   Final   Culture  Setup Time     Final   Value: 07/13/2014 11:11     Performed at [facility]   Culture     Final   Value:        BLOOD CULTURE RECEIVED NO GROWTH TO DATE CULTURE WILL BE HELD FOR 5 DAYS BEFORE ISSUING A FINAL NEGATIVE REPORT     Note: Culture results may be compromised due to an inadequate volume of blood received in culture bottles.     Performed at [facility]   Report Status PENDING   Incomplete  CULTURE, BLOOD (ROUTINE X 2)     Status: None   Collection Time    07/13/14  3:32 AM      Result Value Ref Range Status   Specimen Description BLOOD LEFT ANTECUBITAL   Final   Special Requests BOTTLES DRAWN AEROBIC AND ANAEROBIC 5CC   Final   Culture  Setup Time     Final   Value: 07/13/2014 11:03  Performed at [facility]     Final   Value:        BLOOD CULTURE RECEIVED NO GROWTH TO DATE CULTURE WILL BE HELD FOR 5 DAYS BEFORE ISSUING A FINAL NEGATIVE REPORT     Performed at [facility]   Report Status PENDING   Incomplete  MRSA PCR SCREENING     Status: None   Collection Time    07/13/14  5:11 AM      Result Value Ref Range Status   MRSA by PCR NEGATIVE  NEGATIVE Final   Comment:            The GeneXpert MRSA Assay (FDA     approved for NASAL specimens     only), is one component of a     comprehensive MRSA colonization     surveillance program. It is not     intended to diagnose MRSA     infection nor to guide or     monitor treatment for     MRSA infections.     Discharge Instructions:   Discharge Instructions   Call MD for:  extreme fatigue    Complete by:  As directed      Call MD for:  persistant nausea and vomiting    Complete by:  As directed      Call MD for:  severe uncontrolled pain    Complete  by:  As directed      Call MD for:  temperature >100.4    Complete by:  As directed      Diet - low sodium heart healthy    Complete by:  As directed      Discharge instructions    Complete by:  As directed   You were cared for by Dr. Jacquelynn Cree  (a hospitalist) during your hospital stay. If you have any questions about your discharge medications or the care you received while you were in the hospital after you are discharged, you can call the unit and ask to speak with the hospitalist on call if the hospitalist that took care of you is not available. Once you are discharged, your primary care physician will handle any further medical issues. Please note that NO REFILLS for any discharge medications will be authorized once you are discharged, as it is imperative that you return to your primary care physician (or establish a relationship with a primary care physician if you do not have one) for your aftercare needs so that they can reassess your need for medications and monitor your lab values.  Any outstanding tests can be reviewed by your PCP at your follow up visit.  It is also important to review any medicine changes with your PCP.  Please bring these d/c instructions with you to your next visit so your physician can review these changes with you.     Increase activity slowly    Complete by:  As directed             Medication List    STOP taking these medications       ciprofloxacin 0.3 % ophthalmic solution  Commonly known as:  CILOXAN     clindamycin 150 MG capsule  Commonly known as:  CLEOCIN      TAKE these medications       albuterol-ipratropium 18-103 MCG/ACT inhaler  Commonly known as:  COMBIVENT  Inhale 1 puff into the lungs every 4 (four) hours.  ipratropium-albuterol 0.5-2.5 (3) MG/3ML Soln  Commonly known as:  DUONEB  Take 3 mLs by nebulization every 4 (four) hours as needed (shortness of breath).     amoxicillin-clavulanate 500-125 MG per tablet  Commonly  known as:  AUGMENTIN  Take 1 tablet (500 mg total) by mouth 2 (two) times daily.     aspirin 81 MG tablet  Take 81 mg by mouth daily.     atorvastatin 20 MG tablet  Commonly known as:  LIPITOR  Take 1 tablet (20 mg total) by mouth at bedtime.     bisoprolol 5 MG tablet  Commonly known as:  ZEBETA  Take 1 tablet (5 mg total) by mouth daily.     DOCUSATE SODIUM PO  Take 1 capsule by mouth daily as needed. For constipation     feeding supplement (ENSURE COMPLETE) Liqd  Take 237 mLs by mouth 2 (two) times daily between meals.     furosemide 20 MG tablet  Commonly known as:  LASIX  Take 1 tablet (20 mg total) by mouth daily.     isosorbide-hydrALAZINE 20-37.5 MG per tablet  Commonly known as:  BIDIL  Take 1 tablet by mouth 3 (three) times daily.     LORazepam 1 MG tablet  Commonly known as:  ATIVAN  Take 1 tablet (1 mg total) by mouth every 6 (six) hours as needed for anxiety.     megestrol 400 MG/10ML suspension  Commonly known as:  MEGACE  Take 10 mLs (400 mg total) by mouth 2 (two) times daily.     morphine CONCENTRATE 10 mg / 0.5 ml concentrated solution  Take 0.25 mLs (5 mg total) by mouth every 2 (two) hours as needed for moderate pain or shortness of breath.           Follow-up Information   Schedule an appointment as soon as possible for a visit with HOPPER,DAVID, MD. (As needed)    Specialty:  Family Medicine   Contact information:   [place] [address] [phone]        Time coordinating discharge: 35 minutes.  Signed:  RAMA,CHRISTINA  Pager [phone] [facility] 07/17/2014, 10:12 AM

## 2014-07-19 LAB — CULTURE, BLOOD (ROUTINE X 2)
CULTURE: NO GROWTH
Culture: NO GROWTH

## 2014-07-22 ENCOUNTER — Telehealth: Payer: Self-pay

## 2014-07-22 NOTE — Telephone Encounter (Signed)
Rachel Moulds from [facility] called regarding patient. Please return call [phone]

## 2014-07-23 NOTE — Telephone Encounter (Signed)
[facility], but Edward Nunez was not available, and person answering phone has advised to try back tomorrow, Edward Nunez is out of office today.

## 2014-07-27 NOTE — Telephone Encounter (Signed)
They have a care plan to send for signature. They will be faxing over report. They only needed the fax number.

## 2014-08-09 DEATH — deceased

## 2014-09-24 ENCOUNTER — Telehealth: Payer: Self-pay

## 2014-09-24 NOTE — Telephone Encounter (Signed)
Called patient to remind him to get a flu shot.  His daughter informed me that the patient is deceased.

## 2016-01-03 IMAGING — CT CT CHEST W/O CM
3 of 4 series · 16 of 30 positions shown, 18 images · non-contrast
Comparison: Chest radiograph 06/01/2014.  12/04/2012.

CLINICAL DATA: RIGHT upper lobe mass. Hemoptysis. 35 lb weight
loss.

EXAM:
CT CHEST WITHOUT CONTRAST
TECHNIQUE: Multidetector CT imaging of the chest was performed following the
standard protocol without IV contrast..

[Series 3: chest w/o · axial · non-contrast · 0.70mm/px · z∈[-278,-26]mm · 6 of 100 slices shown, 8 images]
[im 15/100  mediastinal]
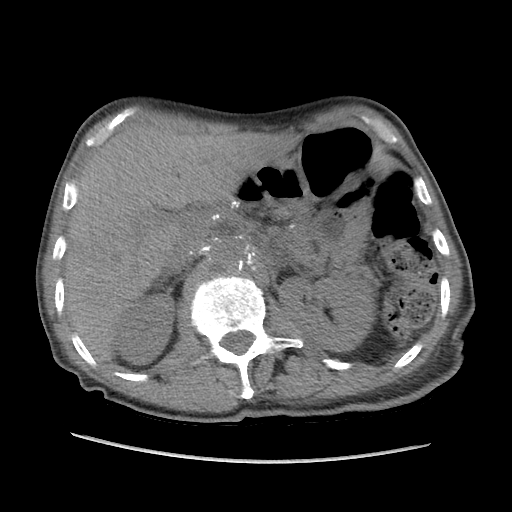
[im 15/100  lung]
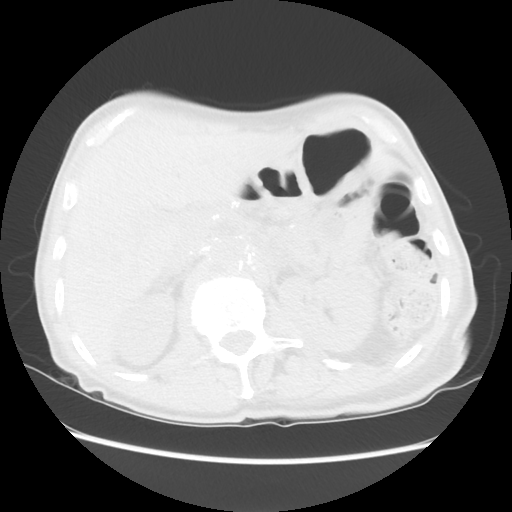
[im 29/100  lung]
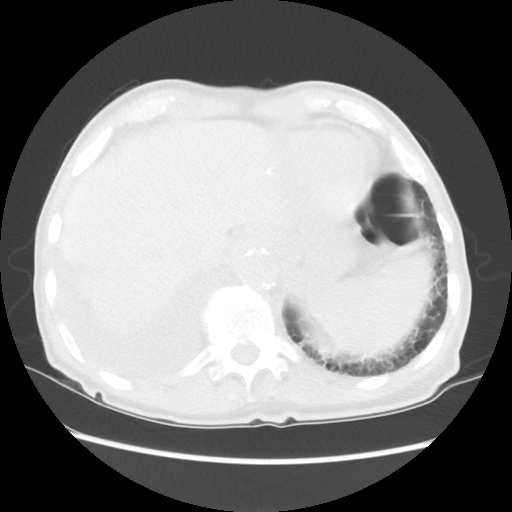
[im 43/100  lung]
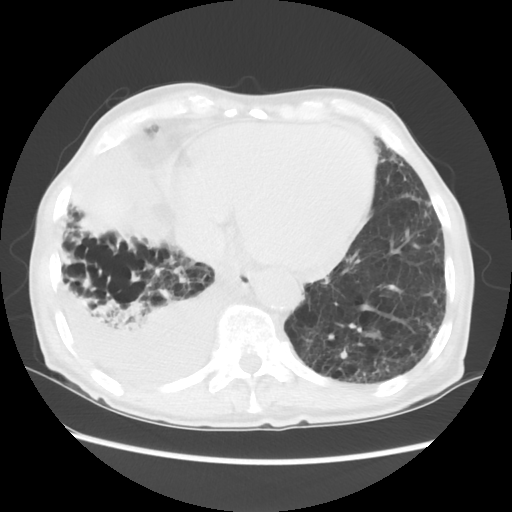
[im 57/100  lung]
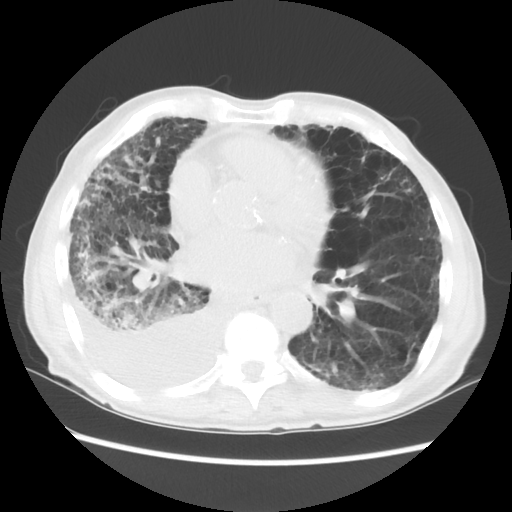
[im 71/100  mediastinal]
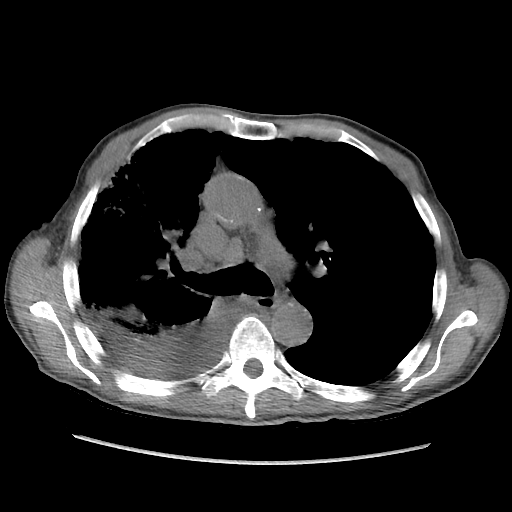
[im 71/100  lung]
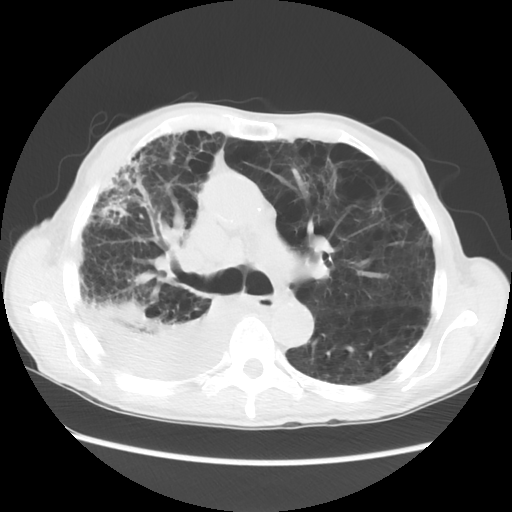
[im 85/100  lung]
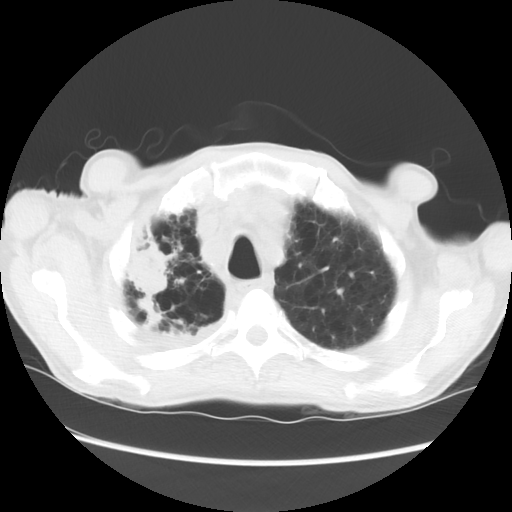

[Series 4: lung windows · axial · 0.70mm/px · z∈[-278,-26]mm · 6 of 100 slices shown]
[im 15/100  lung]
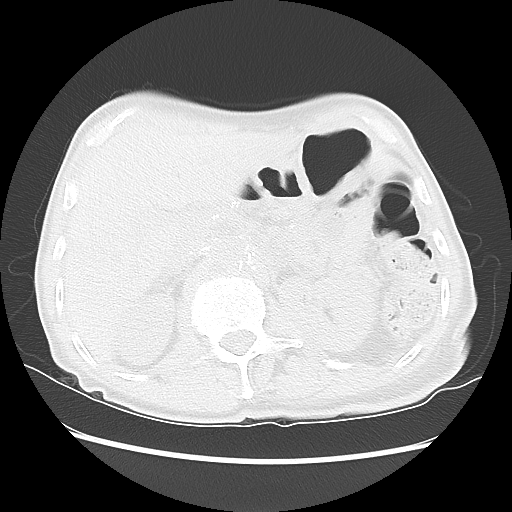
[im 29/100  lung]
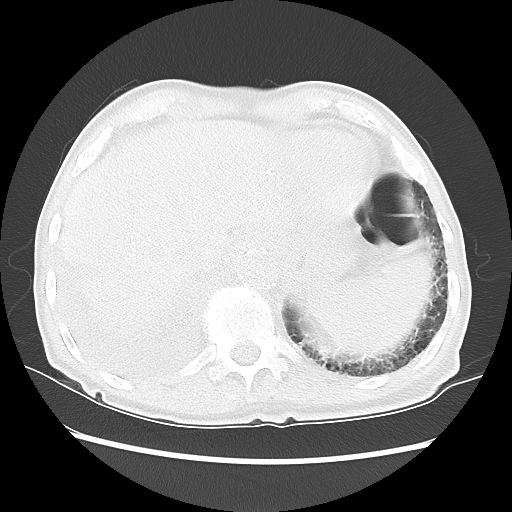
[im 43/100  lung]
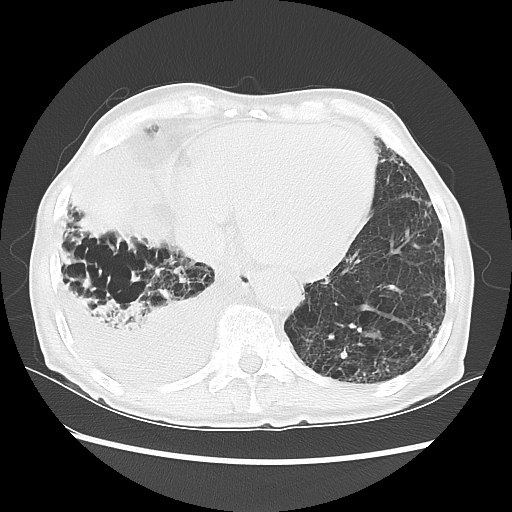
[im 57/100  lung]
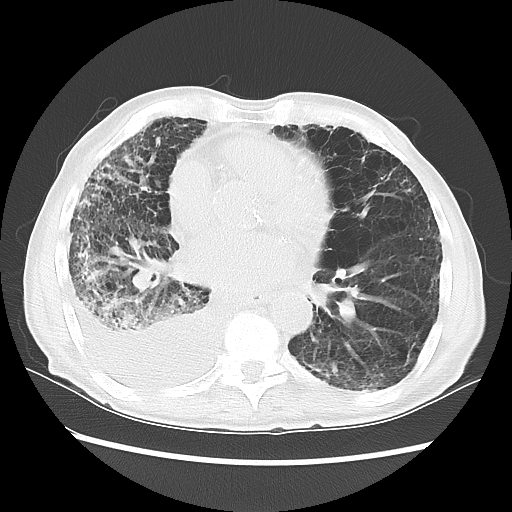
[im 71/100  lung]
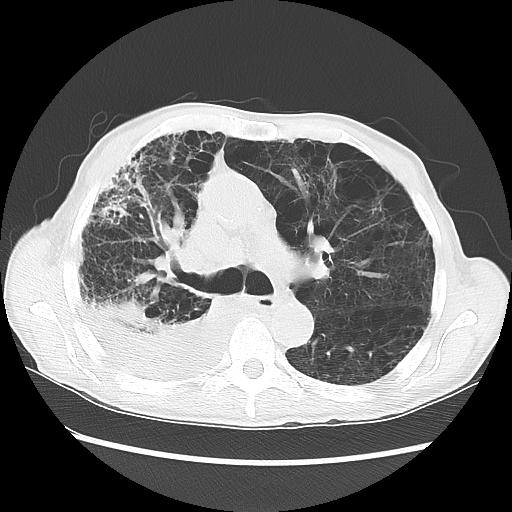
[im 85/100  lung]
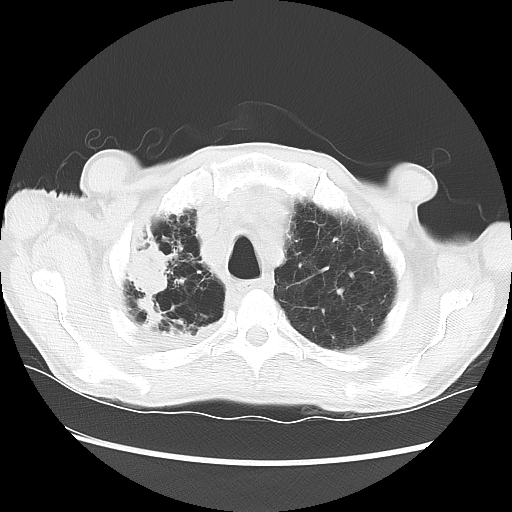

[Series 602: sagittal body · sagittal · 0.73mm/px · 4 of 145 slices shown]
[im 14/145  mediastinal]
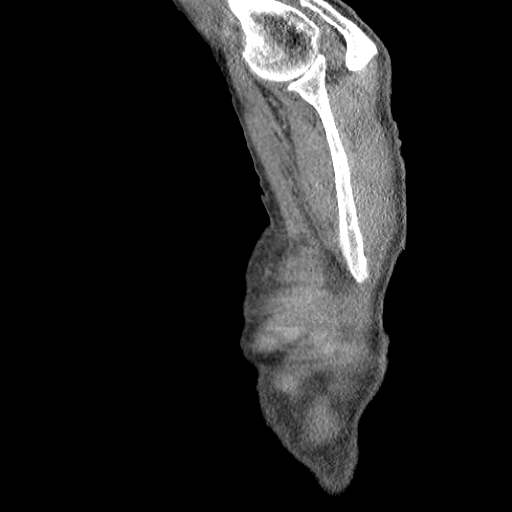
[im 27/145  mediastinal]
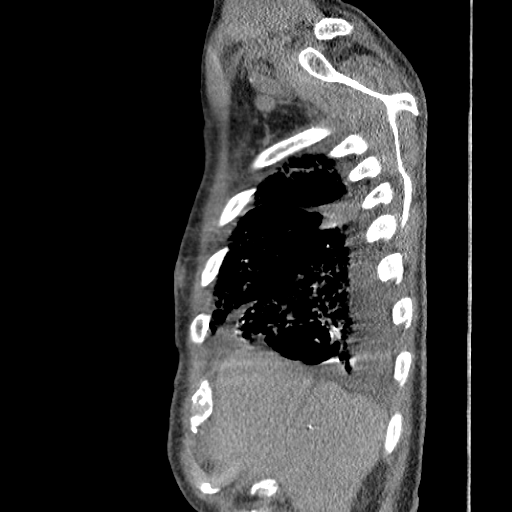
[im 53/145  mediastinal]
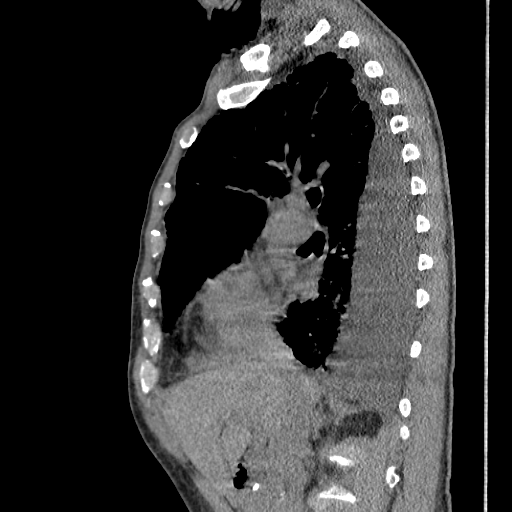
[im 66/145  mediastinal]
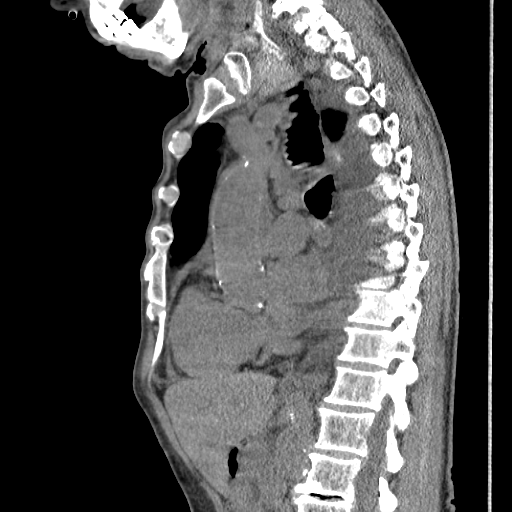

[16 of 30 positions shown; findings below may reference images not displayed]

FINDINGS: Musculoskeletal: Upper lumbar spondylosis and severe lower cervical
spondylosis. No thoracic spine compression fractures or aggressive
osseous lesions. No rib destruction is evident associated with RIGHT
upper lobe mass lesion. Severe bilateral AC joint osteoarthritis.

Lungs: The radiographically evident mass in the RIGHT apex measures
43 mm x 28 mm on axial image 15. This abuts the pleural surface but
appears pulmonary parenchymal in location. There is diffuse
interstitial and alveolar opacity throughout the RIGHT lung.
Underlying severe centrilobular emphysema. No airspace disease or
interstitial opacity in the LEFT lung. Compressive atelectasis
associated with RIGHT pleural effusion.

Central airways: Nodular density is present in the inferior RIGHT
mainstem bronchus, probably representing retained or tenacious
secretions. (Image 32 series 4).

Vasculature: Atherosclerosis and coronary artery disease.
Cardiomegaly.

Effusions: Small to moderate RIGHT pleural effusion is present, with
some loculation anteriorly over the RIGHT hemidiaphragm. Associated
compressive atelectasis. No LEFT pleural effusion. Small pericardial
effusion.

Lymphadenopathy: The patient is cachectic. There is no axillary
adenopathy. Mediastinal adenopathy is present. Precarinal node
measures 12 mm short axis. Hilar adenopathy is adequately evaluated
in the absence of IV contrast. Conglomeration of AP window lymph
nodes present. Subcarinal node measures 15 mm short axis with small
calcification probably from granulomatous disease. Small anterior
pericardial node present.

Esophagus: Grossly normal.

Upper abdomen: Old granulomatous disease. Atherosclerosis. Probable
cyst in the LEFT upper renal pole.

Other: None.
IMPRESSION: 1. 43 mm x 28 mm RIGHT upper lobe mass. In the presence of RIGHT
pleural effusion and lymphadenopathy this is suspicious for primary
bronchogenic carcinoma with malignant effusion. Atypical infection
is considered less likely. Cryptogenic organizing pneumonia is a
consideration. Followup PET-CT recommended to assess for metabolic
activity.
2. Atherosclerosis.
3. Severe emphysema.
4. Cardiomegaly with small pleural effusion.
